# Patient Record
Sex: Female | Born: 1950
Health system: Southern US, Community
[De-identification: ages and names within clinical notes are randomized; demographics above are authoritative.]

## PROBLEM LIST (undated history)

## (undated) DIAGNOSIS — T7840XA Allergy, unspecified, initial encounter: Secondary | ICD-10-CM

## (undated) DIAGNOSIS — K621 Rectal polyp: Secondary | ICD-10-CM

## (undated) DIAGNOSIS — E7800 Pure hypercholesterolemia, unspecified: Secondary | ICD-10-CM

## (undated) DIAGNOSIS — K529 Noninfective gastroenteritis and colitis, unspecified: Secondary | ICD-10-CM

## (undated) DIAGNOSIS — I214 Non-ST elevation (NSTEMI) myocardial infarction: Secondary | ICD-10-CM

## (undated) DIAGNOSIS — I10 Essential (primary) hypertension: Secondary | ICD-10-CM

## (undated) DIAGNOSIS — K635 Polyp of colon: Secondary | ICD-10-CM

## (undated) DIAGNOSIS — E785 Hyperlipidemia, unspecified: Secondary | ICD-10-CM

## (undated) DIAGNOSIS — M1990 Unspecified osteoarthritis, unspecified site: Secondary | ICD-10-CM

## (undated) DIAGNOSIS — I739 Peripheral vascular disease, unspecified: Secondary | ICD-10-CM

## (undated) DIAGNOSIS — D649 Anemia, unspecified: Secondary | ICD-10-CM

## (undated) DIAGNOSIS — K648 Other hemorrhoids: Secondary | ICD-10-CM

## (undated) DIAGNOSIS — I2510 Atherosclerotic heart disease of native coronary artery without angina pectoris: Secondary | ICD-10-CM

## (undated) HISTORY — DX: Hyperlipidemia, unspecified: E78.5

## (undated) HISTORY — DX: Anemia, unspecified: D64.9

## (undated) HISTORY — DX: Rectal polyp: K62.1

## (undated) HISTORY — DX: Atherosclerotic heart disease of native coronary artery without angina pectoris: I25.10

## (undated) HISTORY — DX: Allergy, unspecified, initial encounter: T78.40XA

## (undated) HISTORY — DX: Pure hypercholesterolemia, unspecified: E78.00

## (undated) HISTORY — DX: Polyp of colon: K63.5

## (undated) HISTORY — DX: Unspecified osteoarthritis, unspecified site: M19.90

## (undated) HISTORY — DX: Essential (primary) hypertension: I10

## (undated) HISTORY — DX: Noninfective gastroenteritis and colitis, unspecified: K52.9

## (undated) HISTORY — DX: Other hemorrhoids: K64.8

## (undated) HISTORY — PX: ROTATOR CUFF REPAIR: SHX139

---

## 1991-08-03 HISTORY — PX: ABDOMINAL HYSTERECTOMY: SHX81

## 1998-02-17 ENCOUNTER — Emergency Department (HOSPITAL_COMMUNITY): Admission: EM | Admit: 1998-02-17 | Discharge: 1998-02-17 | Payer: Self-pay | Admitting: Emergency Medicine

## 2000-01-06 ENCOUNTER — Encounter: Payer: Self-pay | Admitting: Emergency Medicine

## 2000-01-06 ENCOUNTER — Emergency Department (HOSPITAL_COMMUNITY): Admission: EM | Admit: 2000-01-06 | Discharge: 2000-01-07 | Payer: Self-pay | Admitting: Emergency Medicine

## 2001-12-01 ENCOUNTER — Emergency Department (HOSPITAL_COMMUNITY): Admission: EM | Admit: 2001-12-01 | Discharge: 2001-12-01 | Payer: Self-pay | Admitting: Emergency Medicine

## 2001-12-01 ENCOUNTER — Encounter: Payer: Self-pay | Admitting: Emergency Medicine

## 2002-12-07 ENCOUNTER — Encounter (INDEPENDENT_AMBULATORY_CARE_PROVIDER_SITE_OTHER): Payer: Self-pay | Admitting: Specialist

## 2002-12-07 ENCOUNTER — Ambulatory Visit (HOSPITAL_COMMUNITY): Admission: RE | Admit: 2002-12-07 | Discharge: 2002-12-07 | Payer: Self-pay | Admitting: *Deleted

## 2008-02-16 ENCOUNTER — Encounter: Admission: RE | Admit: 2008-02-16 | Discharge: 2008-02-16 | Payer: Self-pay | Admitting: Internal Medicine

## 2009-01-07 ENCOUNTER — Emergency Department (HOSPITAL_COMMUNITY): Admission: EM | Admit: 2009-01-07 | Discharge: 2009-01-07 | Payer: Self-pay | Admitting: Family Medicine

## 2010-12-08 ENCOUNTER — Inpatient Hospital Stay (INDEPENDENT_AMBULATORY_CARE_PROVIDER_SITE_OTHER)
Admission: RE | Admit: 2010-12-08 | Discharge: 2010-12-08 | Disposition: A | Discharge status: Home / Self Care | Payer: BC Managed Care – PPO | Source: Ambulatory Visit | Attending: Family Medicine | Admitting: Family Medicine

## 2010-12-08 DIAGNOSIS — M25519 Pain in unspecified shoulder: Secondary | ICD-10-CM

## 2010-12-08 DIAGNOSIS — M67919 Unspecified disorder of synovium and tendon, unspecified shoulder: Secondary | ICD-10-CM

## 2010-12-18 NOTE — Op Note (Signed)
   NAMERUEY, STORER                          ACCOUNT NO.:  [id number]   MEDICAL RECORD NO.:  [id number]                   PATIENT TYPE:  AMB   LOCATION:  ENDO                                 FACILITY:  [facility]   PHYSICIAN:  Georgiana Spinner, M.D.                 DATE OF BIRTH:  [birth year]   DATE OF PROCEDURE:  12/07/2002  DATE OF DISCHARGE:                                 OPERATIVE REPORT   PROCEDURE:  Colonoscopy with polypectomy.   INDICATIONS FOR PROCEDURE:  Colon polyp, rectal bleeding.   ANESTHESIA:  Demerol 80, Versed 8 mg.   DESCRIPTION OF PROCEDURE:  With the patient mildly sedated in the left  lateral decubitus position, the Olympus videoscopic colonoscope was inserted  in the rectum and passed under direct vision to the cecum identified by the  ileocecal valve and appendiceal orifice both of which were photographed.  From this point, the colonoscope was slowly withdrawn taking circumferential  views of the entire colonic mucosa stopping only in the rectum which  appeared normal at first view and direct and showed hemorrhoids that were  fairly large on retroflexed view. On retroflexion view, we also saw a polyp  that was very distal in the rectum which we photographed and then in  straight view, we were able to remove it using snare cautery technique on a  setting of 20/20 blended current. There was good hemostasis when tissue was  retrieved for pathology.  The patient's vital signs and pulse oximeter  remained stable. The patient tolerated the procedure well without apparent  complications.   FINDINGS:  Large internal hemorrhoids, polyp in the rectum removed. Await  pathology report. The patient will call me for results and followup with me  as an outpatient.                                               Georgiana Spinner, M.D.    GMO/MEDQ  D:  12/07/2002  T:  12/07/2002  Job:  [id number]

## 2013-01-09 ENCOUNTER — Encounter (HOSPITAL_COMMUNITY): Payer: Self-pay | Admitting: *Deleted

## 2013-01-09 ENCOUNTER — Emergency Department (HOSPITAL_COMMUNITY)
Admission: EM | Admit: 2013-01-09 | Discharge: 2013-01-09 | Disposition: A | Discharge status: Home / Self Care | Payer: Managed Care, Other (non HMO) | Source: Home / Self Care | Attending: Emergency Medicine | Admitting: Emergency Medicine

## 2013-01-09 DIAGNOSIS — R109 Unspecified abdominal pain: Secondary | ICD-10-CM

## 2013-01-09 MED ORDER — OMEPRAZOLE 40 MG PO CPDR: 40.0000 mg | DELAYED_RELEASE_CAPSULE | Freq: Every day | ORAL | Status: DC

## 2013-01-09 MED ORDER — METRONIDAZOLE 500 MG PO TABS: 500.0000 mg | ORAL_TABLET | Freq: Three times a day (TID) | ORAL | Status: AC

## 2013-01-09 NOTE — ED Notes (Signed)
C/o abdominal cramping onset 1700.  Went to [facility] had a loose BM and vomited a little bit.  States she started sweating, sweat was running off of her face, and lasted for 30 minutes, through the whole process.  Feels better now except being cold.

## 2013-01-09 NOTE — ED Provider Notes (Signed)
History     CSN: [id number]  Arrival date & time 01/09/13  1800   First MD Initiated Contact with Patient 01/09/13 1856      Chief Complaint  Patient presents with  . Abdominal Cramping    (Consider location/radiation/quality/duration/timing/severity/associated sxs/prior treatment) HPI Comments: Patient presents to urgent care this evening complaining of sudden onset of abdominal cramping associated with increased bowel movements. She describes the stools as loose and had a episode of vomiting. Denies any fevers but felt like she was having chills. Denies any urinary symptoms. Describes that she feels much better after that cramping have disappeared.  Patient describes that she to recently a course of amoxicillin for a abscess she finished the course last week. Prior to today she has not had any fevers or abdominal pain. Patient denies any recent international trips.  Patient is a 62 y.o. female presenting with cramps. The history is provided by the patient.  Abdominal Cramping This is a new problem. The current episode started 3 to 5 hours ago. The problem has not changed since onset.Associated symptoms include abdominal pain. Pertinent negatives include no headaches and no shortness of breath. Nothing aggravates the symptoms. The treatment provided no relief.    History reviewed. No pertinent past medical history.  Past Surgical History  Procedure Laterality Date  . Abdominal hysterectomy  1993    Family History  Problem Relation Age of Onset  . Kidney failure Mother   . Heart attack Father     History  Substance Use Topics  . Smoking status: Current Every Day Smoker -- 0.50 packs/day    Types: Cigarettes  . Smokeless tobacco: Not on file  . Alcohol Use: No    OB History   Grav Para Term Preterm Abortions TAB SAB Ect Mult Living                  Review of Systems  Constitutional: Negative for fever, chills, activity change, appetite change and unexpected weight  change.  HENT: Negative for neck pain.   Respiratory: Negative for chest tightness and shortness of breath.   Gastrointestinal: Positive for nausea and abdominal pain. Negative for diarrhea, constipation, blood in stool and abdominal distention.  Genitourinary: Negative for pelvic pain.  Musculoskeletal: Negative for back pain.  Skin: Negative for color change and rash.  Neurological: Negative for dizziness and headaches.    Allergies  Keflex  Home Medications   Current Outpatient Rx  Name  Route  Sig  Dispense  Refill  . metroNIDAZOLE (FLAGYL) 500 MG tablet   Oral   Take 1 tablet (500 mg total) by mouth 3 (three) times daily.   14 tablet   0   . omeprazole (PRILOSEC) 40 MG capsule   Oral   Take 1 capsule (40 mg total) by mouth daily.   30 capsule   0     BP 161/78  Pulse 69  Temp(Src) 98.1 F (36.7 C) (Oral)  Resp 16  SpO2 100%  Physical Exam  Nursing note and vitals reviewed. Constitutional: Vital signs are normal. She appears well-developed and well-nourished.  Non-toxic appearance. She does not have a sickly appearance. No distress.  HENT:  Head: Normocephalic.  Eyes: Conjunctivae are normal.  Neck: Neck supple. No JVD present.  Pulmonary/Chest: Effort normal and breath sounds normal.  Abdominal: Soft. Normal appearance and bowel sounds are normal. She exhibits no shifting dullness, no distension, no pulsatile liver, no fluid wave, no abdominal bruit, no ascites, no pulsatile midline mass and  no mass. There is no hepatosplenomegaly, splenomegaly or hepatomegaly. There is tenderness in the epigastric area and periumbilical area. There is no rigidity, no rebound, no guarding, no CVA tenderness, no tenderness at McBurney's point and negative [name]'s sign. No hernia. Hernia confirmed negative in the ventral area.    Lymphadenopathy:    She has no cervical adenopathy.  Neurological: She is alert.  Skin: No rash noted. No erythema.    ED Course  Procedures  (including critical care time)  Labs Reviewed  STOOL CULTURE  CLOSTRIDIUM DIFFICILE BY PCR   No results found.   1. Abdominal cramps       MDM   Problem #1 abdominal pain *( cramping), with loose stools  Patient with upper abdominal pain and loose stools- within the last 24 hours. 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 Moderate level of suspicion for C. Difficile. ( Stool sample was collected tonight). Patient has been started on both Flagyl and a PPI.  Have discussed with patient what symptoms should warrant further evaluation in the emergency department she agrees and understands and will follow-up as necessary. She has been informed that she will be contacted by her nurse to send your if she has a positive result. She was also instructed if no improvement in next 24 hours it should go to the emergency department for further evaluation        Jimmie Molly, MD 01/09/13 2031

## 2013-01-10 LAB — CLOSTRIDIUM DIFFICILE BY PCR: Toxigenic C. Difficile by PCR: NEGATIVE

## 2013-01-13 LAB — STOOL CULTURE: Special Requests: NORMAL

## 2013-01-22 ENCOUNTER — Emergency Department (HOSPITAL_COMMUNITY): Payer: Managed Care, Other (non HMO)

## 2013-01-22 ENCOUNTER — Encounter (HOSPITAL_COMMUNITY): Payer: Self-pay | Admitting: Emergency Medicine

## 2013-01-22 ENCOUNTER — Emergency Department (HOSPITAL_COMMUNITY)
Admission: EM | Admit: 2013-01-22 | Discharge: 2013-01-22 | Disposition: A | Discharge status: Home / Self Care | Payer: Managed Care, Other (non HMO) | Attending: Emergency Medicine | Admitting: Emergency Medicine

## 2013-01-22 DIAGNOSIS — R197 Diarrhea, unspecified: Secondary | ICD-10-CM | POA: Insufficient documentation

## 2013-01-22 DIAGNOSIS — R1110 Vomiting, unspecified: Secondary | ICD-10-CM | POA: Insufficient documentation

## 2013-01-22 DIAGNOSIS — K529 Noninfective gastroenteritis and colitis, unspecified: Secondary | ICD-10-CM

## 2013-01-22 DIAGNOSIS — K5289 Other specified noninfective gastroenteritis and colitis: Secondary | ICD-10-CM | POA: Insufficient documentation

## 2013-01-22 DIAGNOSIS — Z90710 Acquired absence of both cervix and uterus: Secondary | ICD-10-CM | POA: Insufficient documentation

## 2013-01-22 DIAGNOSIS — F17200 Nicotine dependence, unspecified, uncomplicated: Secondary | ICD-10-CM | POA: Insufficient documentation

## 2013-01-22 DIAGNOSIS — Z79899 Other long term (current) drug therapy: Secondary | ICD-10-CM | POA: Insufficient documentation

## 2013-01-22 LAB — COMPREHENSIVE METABOLIC PANEL
AST: 11 U/L (ref 0–37)
BUN: 8 mg/dL (ref 6–23)
CO2: 26 mEq/L (ref 19–32)
Calcium: 9.7 mg/dL (ref 8.4–10.5)
Creatinine, Ser: 0.79 mg/dL (ref 0.50–1.10)
GFR calc Af Amer: >90 mL/min (ref >90)
GFR calc non Af Amer: 89 mL/min — ABNORMAL LOW (ref >90)

## 2013-01-22 LAB — CBC WITH DIFFERENTIAL/PLATELET
Basophils Absolute: 0 10*3/uL (ref 0.0–0.1)
Lymphocytes Relative: 26 % (ref 12–46)
Lymphs Abs: 2.7 10*3/uL (ref 0.7–4.0)
Neutro Abs: 6.9 10*3/uL (ref 1.7–7.7)
Neutrophils Relative %: 66 % (ref 43–77)
Platelets: 371 10*3/uL (ref 150–400)
RBC: 5.01 MIL/uL (ref 3.87–5.11)
WBC: 10.5 10*3/uL (ref 4.0–10.5)

## 2013-01-22 LAB — URINALYSIS, ROUTINE W REFLEX MICROSCOPIC
Glucose, UA: NEGATIVE mg/dL
Leukocytes, UA: NEGATIVE
Nitrite: NEGATIVE
pH: 7 (ref 5.0–8.0)

## 2013-01-22 LAB — LIPASE, BLOOD: Lipase: 19 U/L (ref 11–59)

## 2013-01-22 MED ORDER — ONDANSETRON HCL 4 MG/2ML IJ SOLN
4.0000 mg | Freq: Once | INTRAMUSCULAR | Status: AC
  Administered 2013-01-22: 4 mg via INTRAVENOUS
  Filled 2013-01-22: qty 2

## 2013-01-22 MED ORDER — OXYCODONE-ACETAMINOPHEN 5-325 MG PO TABS: 2.0000 | ORAL_TABLET | ORAL | Status: DC | PRN

## 2013-01-22 MED ORDER — SODIUM CHLORIDE 0.9 % IV BOLUS (SEPSIS)
1000.0000 mL | Freq: Once | INTRAVENOUS | Status: AC
  Administered 2013-01-22: 1000 mL via INTRAVENOUS

## 2013-01-22 MED ORDER — ONDANSETRON HCL 4 MG/2ML IJ SOLN
4.0000 mg | Freq: Once | INTRAMUSCULAR | Status: DC
  Filled 2013-01-22: qty 2

## 2013-01-22 MED ORDER — IOHEXOL 300 MG/ML  SOLN
50.0000 mL | Freq: Once | INTRAMUSCULAR | Status: AC | PRN
  Administered 2013-01-22: 50 mL via ORAL

## 2013-01-22 MED ORDER — PROMETHAZINE HCL 25 MG PO TABS: 25.0000 mg | ORAL_TABLET | Freq: Four times a day (QID) | ORAL | Status: DC | PRN

## 2013-01-22 MED ORDER — IOHEXOL 300 MG/ML  SOLN
100.0000 mL | Freq: Once | INTRAMUSCULAR | Status: AC | PRN
  Administered 2013-01-22: 100 mL via INTRAVENOUS

## 2013-01-22 MED ORDER — FENTANYL CITRATE 0.05 MG/ML IJ SOLN
100.0000 ug | Freq: Once | INTRAMUSCULAR | Status: AC
  Administered 2013-01-22: 100 ug via INTRAVENOUS
  Filled 2013-01-22: qty 2

## 2013-01-22 NOTE — ED Provider Notes (Addendum)
History    CSN: [id number] Arrival date & time 01/22/13  1602  First MD Initiated Contact with Patient 01/22/13 1607     Chief Complaint  Patient presents with  . Abdominal Pain    HPI Per EMS: two week history of epigastric pain. Pt vomiting in route to ED. Pt states she has had diarrhea since yesterday.patient recently seen in urgent care for similar symptoms.  Was given Flagyl which seemed to temporarily improve her symptoms.  Symptoms have since returned.  Denies fever or chills.  Denies blood in her stool.  Past surgical history positive for hysterectomy otherwise unremarkable.   History reviewed. No pertinent past medical history. Past Surgical History  Procedure Laterality Date  . Abdominal hysterectomy  1993   Family History  Problem Relation Age of Onset  . Kidney failure Mother   . Heart attack Father    History  Substance Use Topics  . Smoking status: Current Every Day Smoker -- 0.50 packs/day    Types: Cigarettes  . Smokeless tobacco: Not on file  . Alcohol Use: No   OB History   Grav Para Term Preterm Abortions TAB SAB Ect Mult Living                 Review of Systems All other systems reviewed an Allergies  Keflex and Tomato  Home Medications   Current Outpatient Rx  Name  Route  Sig  Dispense  Refill  . hydroxypropyl methylcellulose (ISOPTO TEARS) 2.5 % ophthalmic solution   Both Eyes   Place 1 drop into both eyes 3 (three) times daily as needed (dry eyes).         Marland Kitchen omeprazole (PRILOSEC) 40 MG capsule   Oral   Take 1 capsule (40 mg total) by mouth daily.   30 capsule   0   . oxyCODONE-acetaminophen (PERCOCET/ROXICET) 5-325 MG per tablet   Oral   Take 2 tablets by mouth every 4 (four) hours as needed for pain.   20 tablet   0   . promethazine (PHENERGAN) 25 MG tablet   Oral   Take 1 tablet (25 mg total) by mouth every 6 (six) hours as needed for nausea.   30 tablet   0    BP 157/64  Pulse 76  Temp(Src) 98.3 F (36.8 C) (Oral)   Resp 18  Ht 5\' 3"  (1.6 m)  Wt 148 lb (67.132 kg)  BMI 26.22 kg/m2  SpO2 98% Physical Exam  Nursing note and vitals reviewed. Constitutional: She is oriented to person, place, and time. She appears well-developed and well-nourished. No distress.  HENT:  Head: Normocephalic and atraumatic.  Eyes: Pupils are equal, round, and reactive to light.  Neck: Normal range of motion.  Cardiovascular: Normal rate and intact distal pulses.   Pulmonary/Chest: No respiratory distress.  Abdominal: Normal appearance and bowel sounds are normal. She exhibits no distension. There is tenderness in the left upper quadrant. There is no rebound and no guarding.    Musculoskeletal: Normal range of motion.  Neurological: She is alert and oriented to person, place, and time. No cranial nerve deficit.  Skin: Skin is warm and dry. No rash noted.  Psychiatric: She has a normal mood and affect. Her behavior is normal.    ED Course  Procedures (including critical care time)  Medications  ondansetron (ZOFRAN) injection 4 mg (4 mg Intravenous Not Given 01/22/13 1914)  sodium chloride 0.9 % bolus 1,000 mL (1,000 mLs Intravenous New Bag/Given 01/22/13 1719)  fentaNYL (SUBLIMAZE) injection 100 mcg (100 mcg Intravenous Given 01/22/13 1719)  ondansetron (ZOFRAN) injection 4 mg (4 mg Intravenous Given 01/22/13 1718)  fentaNYL (SUBLIMAZE) injection 100 mcg (100 mcg Intravenous Given 01/22/13 1908)    Labs Reviewed  COMPREHENSIVE METABOLIC PANEL - Abnormal; Notable for the following:    Potassium 3.3 (*)    GFR calc non Af Amer 89 (*)    All other components within normal limits  LIPASE, BLOOD  CBC WITH DIFFERENTIAL  URINALYSIS, ROUTINE W REFLEX MICROSCOPIC   Ct Abdomen Pelvis W Contrast  01/22/2013   *RADIOLOGY REPORT*  Clinical Data: Epigastric pain with vomiting and diarrhea for 2 weeks.  History of hysterectomy.  CT ABDOMEN AND PELVIS WITH CONTRAST  Technique:  Multidetector CT imaging of the abdomen and pelvis  was performed following the standard protocol during bolus administration of intravenous contrast.  Contrast: OMNIPAQUE IOHEXOL 300 MG/ML  SOLN, 50mL OMNIPAQUE IOHEXOL 300 MG/ML  SOLN  Comparison: None.  Findings: The lung bases are clear.  There is no pleural or pericardial effusion.  The liver, gallbladder, biliary system and pancreas appear normal. The spleen, adrenal glands and kidneys appear normal.  The stomach, small bowel and proximal colon appear normal.  There is irregular colonic wall thickening extending from the distal transverse colon through the descending colon.  The sigmoid colon and rectum appear normal.  There is no evidence of bowel obstruction or extraluminal fluid collection.  There is irregular atherosclerosis of the abdominal aorta which is mildly dilated below the renal arteries to 2.8 cm.  There is associated mural thrombus posteriorly on the left. There is no evidence of large vessel occlusion, although the inferior mesenteric artery is not well visualized.  No enlarged abdominal pelvic lymph nodes are identified.  There is no pelvic mass status post hysterectomy.  There is probable normal ovarian tissue on the right.  The bladder appears normal.  There are no acute or worrisome osseous findings.  A mild scoliosis is noted.  IMPRESSION:  1.  Mid to distal colonic wall thickening consistent with colitis. This distribution can be seen with ischemia and infectious colitis, including pseudomembranous colitis. 2.  No evidence of bowel perforation, abscess or obstruction. 3.  Atherosclerosis with mild dilatation of the infrarenal abdominal aorta.   Original Report Authenticated By: Carey Bullocks, M.D.   1. Colitis     MDM  Discussed the case in CT results with Dr.  Marina Goodell from GI.  He recommended followup in his office this week.  He stated to call the office tomorrow and either a physician or PA could see her this week.  Nelia Shi, MD 01/22/13 2227  Nelia Shi,  MD 02/28/13 [phone]

## 2013-01-22 NOTE — ED Notes (Signed)
Pt stated that she is not able to urinate.   Will try again in 30 minutes. 

## 2013-01-22 NOTE — ED Notes (Signed)
Per EMS: two week history of epigastric pain. Pt vomiting in route to ED. Pt states she has had diarrhea since yesterday.

## 2013-01-22 NOTE — ED Notes (Signed)
Patient is aware we need a urine specimen. States "I can't go, I  don't have anything in me"

## 2013-01-22 NOTE — ED Notes (Signed)
ZOX:[id number]<AV> Expected date:<BR> Expected time:<BR> Means of arrival:<BR> Comments:<BR> 61yoF, n/v

## 2013-01-23 ENCOUNTER — Encounter: Payer: Self-pay | Admitting: Internal Medicine

## 2013-02-19 ENCOUNTER — Encounter: Payer: Self-pay | Admitting: Internal Medicine

## 2013-02-19 ENCOUNTER — Ambulatory Visit (INDEPENDENT_AMBULATORY_CARE_PROVIDER_SITE_OTHER): Payer: Managed Care, Other (non HMO) | Admitting: Internal Medicine

## 2013-02-19 VITALS — BP 160/88 | HR 80 | Ht 63.0 in | Wt 143.0 lb

## 2013-02-19 DIAGNOSIS — Z1211 Encounter for screening for malignant neoplasm of colon: Secondary | ICD-10-CM

## 2013-02-19 DIAGNOSIS — K5289 Other specified noninfective gastroenteritis and colitis: Secondary | ICD-10-CM

## 2013-02-19 DIAGNOSIS — R933 Abnormal findings on diagnostic imaging of other parts of digestive tract: Secondary | ICD-10-CM

## 2013-02-19 DIAGNOSIS — R1084 Generalized abdominal pain: Secondary | ICD-10-CM

## 2013-02-19 MED ORDER — MOVIPREP 100 G PO SOLR: 1.0000 | Freq: Once | ORAL | Status: DC

## 2013-02-19 NOTE — Patient Instructions (Addendum)

## 2013-02-19 NOTE — Progress Notes (Signed)
HISTORY OF PRESENT ILLNESS:  Cassandra Santana is a 62 y.o. female smoker with no significant past medical history who presents today, after referral from the emergency room physician Dr. Radford Pax, regarding recent abdominal complaints and an abnormal CT scan identified in the emergency room. The patient's history dates back to 01/09/2013 when she was evaluated at urgent care for sudden onset of abdominal cramping and increased bowel movements. She was treated empirically with metronidazole and omeprazole. She seemed to improve until June 22 when she developed acute nausea, vomiting, and diarrhea. As well, significant generalized abdominal cramping discomfort. There was rectal bleeding. She was evaluated in the emergency room. Blood work at that time revealed a potassium of 3.3. Comprehensive metabolic panel and CBC was otherwise normal with a white blood cell count of 10.5 and hemoglobin 14.3. She did undergo a contrast-enhanced CT scan of the abdomen and pelvis on June 23. This revealed mid to distal colonic wall thickening of the distal transverse colon and descending colon. She was treated with IV fluids and given medication for pain and nausea. Within a few days, her symptoms resolved. She now states that she is back to normal with regular bowel movements. No further issues with bleeding. Occasional transient abdominal cramping discomfort. 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 She did undergo complete colonoscopy in May of 2004 with Dr. Sabino Gasser. Examination revealed large internal hemorrhoids. A diminutive polyp was removed (rectal) and found to be hyperplastic. She has had no followup since. No family history of colon cancer  REVIEW OF SYSTEMS:  All non-GI ROS negative except for headaches, night sweats, visual change  Past Medical History  Diagnosis Date  . Colitis   . Rectal polyp     hyperplastic  . Internal hemorrhoids     Past Surgical History  Procedure Laterality Date  .  Abdominal hysterectomy  1993    Social History Cassandra Santana  reports that she has been smoking Cigarettes.  She has been smoking about 0.50 packs per day. She has never used smokeless tobacco. She reports that she does not drink alcohol or use illicit drugs.  family history includes Heart attack in her father and Kidney failure in her mother.  Allergies  Allergen Reactions  . Keflex (Cephalexin) Hives  . Tomato Hives and Itching       PHYSICAL EXAMINATION: Vital signs: BP 160/88  Pulse 80  Ht 5\' 3"  (1.6 m)  Wt 143 lb (64.864 kg)  BMI 25.34 kg/m2  Constitutional: generally well-appearing, no acute distress Psychiatric: alert and oriented x3, cooperative Eyes: extraocular movements intact, anicteric, conjunctiva pink Mouth: oral pharynx moist, no lesions Neck: supple no lymphadenopathy Cardiovascular: heart regular rate and rhythm, no murmur Lungs: clear to auscultation bilaterally Abdomen: soft, nontender, nondistended, no obvious ascites, no peritoneal signs, normal bowel sounds, no organomegaly Rectal: Deferred until colonoscopy Extremities: no lower extremity edema bilaterally Skin: no lesions on visible extremities Neuro: No focal deficits.    ASSESSMENT:  #1. Recent problems with acute abdominal pain, nausea, vomiting, and loose stools with blood. CT scan revealing left-sided segmental colitis. Clinical picture is most consistent with acute ischemic colitis. Now resolved. #2. Index colonoscopy May 2004 with hyperplastic polyp. Due for routine repeat screening at this time   PLAN:  #1. Discussion today on ischemic colitis. We discussed the pathophysiology, clinical course, treatment, and outcomes #2. Schedule screening colonoscopy. As well, can evaluate the left colon for any residual abnormalities.The nature of the procedure, as well as the risks,  benefits, and alternatives were carefully and thoroughly reviewed with the patient. Ample time for discussion and  questions allowed. The patient understood, was satisfied, and agreed to proceed. #3. Movi prep prescribed. The patient instructed on its use

## 2013-03-01 ENCOUNTER — Encounter: Payer: Self-pay | Admitting: Internal Medicine

## 2013-03-01 ENCOUNTER — Ambulatory Visit (AMBULATORY_SURGERY_CENTER): Payer: Managed Care, Other (non HMO) | Admitting: Internal Medicine

## 2013-03-01 VITALS — BP 159/87 | HR 74 | Temp 97.7°F | Resp 18 | Ht 63.0 in | Wt 143.0 lb

## 2013-03-01 DIAGNOSIS — D126 Benign neoplasm of colon, unspecified: Secondary | ICD-10-CM

## 2013-03-01 DIAGNOSIS — Z1211 Encounter for screening for malignant neoplasm of colon: Secondary | ICD-10-CM

## 2013-03-01 DIAGNOSIS — K5289 Other specified noninfective gastroenteritis and colitis: Secondary | ICD-10-CM

## 2013-03-01 MED ORDER — FLEET ENEMA 7-19 GM/118ML RE ENEM
1.0000 | ENEMA | Freq: Once | RECTAL | Status: AC
  Administered 2013-03-01: 1 via RECTAL

## 2013-03-01 MED ORDER — SODIUM CHLORIDE 0.9 % IV SOLN: 500.0000 mL | INTRAVENOUS | Status: DC

## 2013-03-01 NOTE — Op Note (Signed)
[facility] [address] [place], [address]   COLONOSCOPY PROCEDURE REPORT  PATIENT: Lounell, Schumacher  MR#: [id number] BIRTHDATE: Dec 02, 1950 , 61  yrs. old GENDER: Female ENDOSCOPIST: Roxy Cedar, MD REFERRED BY:.  Self / Office PROCEDURE DATE:  03/01/2013 PROCEDURE:   Colonoscopy with snare polypectomy x  1 First Screening Colonoscopy - Avg.  risk and is 50 yrs.  old or older - No.  Prior Negative Screening - Now for repeat screening. 10 or more years since last screening  History of Adenoma - Now for follow-up colonoscopy & has been > or = to 3 yrs.  N/A  Polyps Removed Today? Yes. ASA CLASS:   Class II INDICATIONS:average risk screening.   Negative exam with Dr. Virginia Rochester 2004 MEDICATIONS: MAC sedation, administered by CRNA and propofol (Diprivan) 300mg  IV  DESCRIPTION OF PROCEDURE:   After the risks benefits and alternatives of the procedure were thoroughly explained, informed consent was obtained.  A digital rectal exam revealed no abnormalities of the rectum.   The LB [id number]  endoscope was introduced through the anus and advanced to the cecum, which was identified by both the appendix and ileocecal valve. No adverse events experienced.   The quality of the prep was excellent, using MoviPrep  The instrument was then slowly withdrawn as the colon was fully examined.    COLON FINDINGS: A diminutive polyp was found in the sigmoid colon. A polypectomy was performed with a cold snare.  The resection was complete and the polyp tissue was completely retrieved.   The colon mucosa was otherwise normal.  Retroflexed views revealed internal hemorrhoids. The time to cecum=2 minutes 42 seconds.  Withdrawal time=12 minutes 29 seconds.  The scope was withdrawn and the procedure completed. COMPLICATIONS: There were no complications.  ENDOSCOPIC IMPRESSION: 1.   Diminutive polyp was found in the sigmoid colon; polypectomy was performed with a cold snare 2.    The colon mucosa was otherwise normal  RECOMMENDATIONS: Repeat colonoscopy in 5 years if polyp adenomatous; otherwise 10 years   eSigned:  Roxy Cedar, MD 03/01/2013 10:20 AM   cc: Romero Liner, MD and The Patient   PATIENT NAME:  Sherice, Ijames MR#: [id number]

## 2013-03-01 NOTE — Progress Notes (Signed)
No complaints noted in the recovery room. Maw   

## 2013-03-01 NOTE — Progress Notes (Addendum)
Pt. States that she has dark brown liquid return from prep. Fleets enema given.  Results clear tan with a few fecal flakes.    BP rechecked  170/90 left arm.

## 2013-03-01 NOTE — Progress Notes (Signed)
Pt. States that she is very nervous about needles.  IV inserted by Everlean Alstrom. Pt. States that it stings.  Everlean Alstrom offered to restart IV due to pt. Discomfort.  IV site clear, IV infusing well Pt. States that she does not want the IV restarted.  IV left in per pt. Wishes.

## 2013-03-01 NOTE — Progress Notes (Signed)
Called to room to assist during endoscopic procedure.  Patient ID and intended procedure confirmed with present staff. Received instructions for my participation in the procedure from the performing physician.  

## 2013-03-01 NOTE — Patient Instructions (Addendum)
YOU HAD AN ENDOSCOPIC PROCEDURE TODAY AT THE [facility]: Refer to the procedure report that was given to you for any specific questions about what was found during the examination.  If the procedure report does not answer your questions, please call your gastroenterologist to clarify.  If you requested that your care partner not be given the details of your procedure findings, then the procedure report has been included in a sealed envelope for you to review at your convenience later.  YOU SHOULD EXPECT: Some feelings of bloating in the abdomen. Passage of more gas than usual.  Walking can help get rid of the air that was put into your GI tract during the procedure and reduce the bloating. If you had a lower endoscopy (such as a colonoscopy or flexible sigmoidoscopy) you may notice spotting of blood in your stool or on the toilet paper. If you underwent a bowel prep for your procedure, then you may not have a normal bowel movement for a few days.  DIET: Your first meal following the procedure should be a light meal and then it is ok to progress to your normal diet.  A half-sandwich or bowl of soup is an example of a good first meal.  Heavy or fried foods are harder to digest and may make you feel nauseous or bloated.  Likewise meals heavy in dairy and vegetables can cause extra gas to form and this can also increase the bloating.  Drink plenty of fluids but you should avoid alcoholic beverages for 24 hours.  ACTIVITY: Your care partner should take you home directly after the procedure.  You should plan to take it easy, moving slowly for the rest of the day.  You can resume normal activity the day after the procedure however you should NOT DRIVE or use heavy machinery for 24 hours (because of the sedation medicines used during the test).    SYMPTOMS TO REPORT IMMEDIATELY: A gastroenterologist can be reached at any hour.  During normal business hours, 8:30 AM to 5:00 PM Monday through Friday,  call [phone].  After hours and on weekends, please call the GI answering service at [phone] who will take a message and have the physician on call contact you.   Following lower endoscopy (colonoscopy or flexible sigmoidoscopy):  Excessive amounts of blood in the stool  Significant tenderness or worsening of abdominal pains  Swelling of the abdomen that is new, acute  Fever of 100F or higher    FOLLOW UP: If any biopsies were taken you will be contacted by phone or by letter within the next 1-3 weeks.  Call your gastroenterologist if you have not heard about the biopsies in 3 weeks.  Our staff will call the home number listed on your records the next business day following your procedure to check on you and address any questions or concerns that you may have at that time regarding the information given to you following your procedure. This is a courtesy call and so if there is no answer at the home number and we have not heard from you through the emergency physician on call, we will assume that you have returned to your regular daily activities without incident.  SIGNATURES/CONFIDENTIALITY: You and/or your care partner have signed paperwork which will be entered into your electronic medical record.  These signatures attest to the fact that that the information above on your After Visit Summary has been reviewed and is understood.  Full responsibility of the confidentiality   of this discharge information lies with you and/or your care-partner.    Handouts were given to your care partner on polyps, hemorrhoids and a high fiber diet. You may resume your current medications today. Please call if any questions or concerns.

## 2013-03-02 ENCOUNTER — Telehealth: Payer: Self-pay

## 2013-03-02 NOTE — Telephone Encounter (Signed)
Attempt follow up call left message to call [facility] if any problems post procedure

## 2013-03-06 ENCOUNTER — Encounter: Payer: Self-pay | Admitting: Internal Medicine

## 2016-08-24 ENCOUNTER — Ambulatory Visit: Payer: Self-pay

## 2017-01-03 ENCOUNTER — Encounter (HOSPITAL_COMMUNITY): Payer: Self-pay | Admitting: Emergency Medicine

## 2017-01-03 ENCOUNTER — Ambulatory Visit (HOSPITAL_COMMUNITY)
Admission: EM | Admit: 2017-01-03 | Discharge: 2017-01-03 | Disposition: A | Discharge status: Home / Self Care | Payer: BLUE CROSS/BLUE SHIELD | Attending: Emergency Medicine | Admitting: Emergency Medicine

## 2017-01-03 DIAGNOSIS — S161XXA Strain of muscle, fascia and tendon at neck level, initial encounter: Secondary | ICD-10-CM

## 2017-01-03 DIAGNOSIS — S46812A Strain of other muscles, fascia and tendons at shoulder and upper arm level, left arm, initial encounter: Secondary | ICD-10-CM | POA: Diagnosis not present

## 2017-01-03 DIAGNOSIS — H6982 Other specified disorders of Eustachian tube, left ear: Secondary | ICD-10-CM | POA: Diagnosis not present

## 2017-01-03 DIAGNOSIS — H9202 Otalgia, left ear: Secondary | ICD-10-CM | POA: Diagnosis not present

## 2017-01-03 MED ORDER — NAPROXEN 375 MG PO TABS: 375.0000 mg | ORAL_TABLET | Freq: Two times a day (BID) | ORAL | 0 refills | Status: DC

## 2017-01-03 NOTE — ED Provider Notes (Signed)
CSN: [id number]     Arrival date & time 01/03/17  1449 History   First MD Initiated Contact with Patient 01/03/17 1610     Chief Complaint  Patient presents with  . Otalgia   (Consider location/radiation/quality/duration/timing/severity/associated sxs/prior Treatment) 66 year old female presents with left earache starting at the beginning in the last week of May. Earache is associated with pain in the left side of the neck and trapezius. She states the evening before she developed the muscle pain she had slept under a fan. There is pain when rotating and tilting the head. She points to the trapezius and scalene muscles on the left. Also admits to having PND chronically.      Past Medical History:  Diagnosis Date  . Colitis   . Internal hemorrhoids   . Rectal polyp    hyperplastic   Past Surgical History:  Procedure Laterality Date  . ABDOMINAL HYSTERECTOMY  1993   Family History  Problem Relation Age of Onset  . Kidney failure Mother   . Heart attack Father    Social History  Substance Use Topics  . Smoking status: Current Every Day Smoker    Packs/day: 0.50    Types: Cigarettes  . Smokeless tobacco: Never Used  . Alcohol use No   OB History    No data available     Review of Systems  Constitutional: Negative.   HENT: Positive for postnasal drip. Negative for ear discharge and rhinorrhea.   Respiratory: Negative.   Gastrointestinal: Negative.   Musculoskeletal: Positive for neck pain.  Neurological: Negative.   All other systems reviewed and are negative.   Allergies  Keflex [cephalexin] and Tomato  Home Medications   Prior to Admission medications   Medication Sig Start Date End Date Taking? Authorizing Provider  naproxen (NAPROSYN) 375 MG tablet Take 1 tablet (375 mg total) by mouth 2 (two) times daily. 01/03/17   Janne Napoleon, NP   Meds Ordered and Administered this Visit  Medications - No data to display  BP (!) 202/84 (BP Location: Right Arm)   Pulse  75   Temp 98.2 F (36.8 C) (Oral)   Resp 18   SpO2 100%  No data found.   Physical Exam  Constitutional: She is oriented to person, place, and time. She appears well-developed and well-nourished. No distress.  HENT:  Mouth/Throat: No oropharyngeal exudate.  Bilateral TMs are retracted. No erythema no observed effusion.  Oropharynx with scant clear PND otherwise normal.  Neck: Normal range of motion.  Tenderness to the left trapezius muscle along the ridge as well as insertion points to the left posterior lateral neck. Tenderness to the anterior scalene muscle. Pain is produced with having the patient turn her head to the left and with flexion.  Cardiovascular: Normal rate and regular rhythm.   Pulmonary/Chest: Effort normal and breath sounds normal.  Musculoskeletal: Normal range of motion. She exhibits no edema or deformity.  Lymphadenopathy:    She has no cervical adenopathy.  Neurological: She is alert and oriented to person, place, and time.  Skin: Skin is warm and dry.  Psychiatric: She has a normal mood and affect.  Nursing note and vitals reviewed.   Urgent Care Course    ED ECG REPORT   Date: 01/03/2017  Rate: 65  Rhythm: normal sinus rhythm  QRS Axis: normal  Intervals: normal  ST/T Wave abnormalities: normal  Conduction Disutrbances:none  Narrative Interpretation:   Old EKG Reviewed: unchanged  I have personally reviewed the EKG tracing and  agree with the computerized printout as noted.  Procedures (including critical care time)  Labs Review Labs Reviewed - No data to display  Imaging Review No results found.   Visual Acuity Review  Right Eye Distance:   Left Eye Distance:   Bilateral Distance:    Right Eye Near:   Left Eye Near:    Bilateral Near:         MDM   1. Left ear pain   2. Eustachian tube dysfunction, left   3. Trapezius strain, left, initial encounter   4. Cervical muscle strain, initial encounter    Take Sudafed PE 10 mg  every 6 hours as needed for congestion to help open the eustachian tube. Continue the Zyrtec. Apply warm compresses to the ear  which may help with discomfort. Apply heat to the neck muscles off and on. Perform stretches as demonstrated. Meds ordered this encounter  Medications  . naproxen (NAPROSYN) 375 MG tablet    Sig: Take 1 tablet (375 mg total) by mouth 2 (two) times daily.    Dispense:  20 tablet    Refill:  0    Order Specific Question:   Supervising Provider    Answer:   Melynda Ripple [[id number]]        Janne Napoleon, NP 01/03/17 1640

## 2017-01-03 NOTE — Discharge Instructions (Signed)
Take Sudafed PE 10 mg every 6 hours as needed for congestion to help open the eustachian tube. Continue the Zyrtec. Apply warm compresses near the ear  which may help with discomfort. Apply heat to the neck muscles off and on. Perform stretches as demonstrated.

## 2017-01-03 NOTE — ED Triage Notes (Signed)
The patient presented to the [facility] with a complaint of left side ear pain x 2 weeks.

## 2018-02-21 ENCOUNTER — Encounter (HOSPITAL_COMMUNITY): Payer: Self-pay | Admitting: Emergency Medicine

## 2018-02-21 ENCOUNTER — Ambulatory Visit (HOSPITAL_COMMUNITY)
Admission: EM | Admit: 2018-02-21 | Discharge: 2018-02-21 | Disposition: A | Discharge status: Home / Self Care | Payer: Medicare HMO | Attending: Family Medicine | Admitting: Family Medicine

## 2018-02-21 DIAGNOSIS — L739 Follicular disorder, unspecified: Secondary | ICD-10-CM

## 2018-02-21 MED ORDER — DOXYCYCLINE HYCLATE 100 MG PO CAPS
100.0000 mg | ORAL_CAPSULE | Freq: Two times a day (BID) | ORAL | 1 refills | Status: DC
Start: 2018-02-21 — End: 2018-04-07

## 2018-02-21 NOTE — ED Triage Notes (Signed)
Pt here for possible abscess to back

## 2018-02-21 NOTE — Discharge Instructions (Signed)
Wash with antibacterial soap Take antibiotics for 7 days, this should clear up the infection your skin  Repeat if needed Follow-up with your PCP

## 2018-02-21 NOTE — ED Provider Notes (Signed)
[place]    CSN: [id number] Arrival date & time: 02/21/18  1253     History   Chief Complaint Chief Complaint  Patient presents with  . Abscess    HPI Cassandra Santana is a 67 y.o. female.   HPI  Patient is here for skin infections.  She states that she had one on her back that she scratched off with her back scratcher.  Ever since then she is popping up with more of them.  She shows me several on her chest and back.  She thinks she might have a "bug".  No sensitive skin or dermatitis condition in the past.  She is on no medication.  No new soap lotion powder or product.  She states she always uses Dial soap.  No recent fever or illness.  Past Medical History:  Diagnosis Date  . Colitis   . Internal hemorrhoids   . Rectal polyp    hyperplastic    There are no active problems to display for this patient.   Past Surgical History:  Procedure Laterality Date  . ABDOMINAL HYSTERECTOMY  1993    OB History   None      Home Medications    Prior to Admission medications   Medication Sig Start Date End Date Taking? Authorizing Provider  naproxen (NAPROSYN) 375 MG tablet Take 1 tablet (375 mg total) by mouth 2 (two) times daily. 01/03/17   Janne Napoleon, NP    Family History Family History  Problem Relation Age of Onset  . Kidney failure Mother   . Heart attack Father     Social History Social History   Tobacco Use  . Smoking status: Current Every Day Smoker    Packs/day: 0.50    Types: Cigarettes  . Smokeless tobacco: Never Used  Substance Use Topics  . Alcohol use: No  . Drug use: No     Allergies   Keflex [cephalexin] and Tomato   Review of Systems Review of Systems  Constitutional: Negative for chills and fever.  HENT: Negative for ear pain and sore throat.   Eyes: Negative for pain and visual disturbance.  Respiratory: Negative for cough and shortness of breath.   Cardiovascular: Negative for chest pain and palpitations.    Gastrointestinal: Negative for abdominal pain and vomiting.  Genitourinary: Negative for dysuria and hematuria.  Musculoskeletal: Negative for arthralgias and back pain.  Skin: Positive for rash. Negative for color change.  Neurological: Negative for seizures and syncope.  All other systems reviewed and are negative.    Physical Exam Triage Vital Signs ED Triage Vitals [02/21/18 1315]  Enc Vitals Group     BP (!) 166/101     Pulse Rate 94     Resp 18     Temp 98.1 F (36.7 C)     Temp Source Oral     SpO2 100 %     Weight      Height      Head Circumference      Peak Flow      Pain Score      Pain Loc      Pain Edu?      Excl. in [place]?    No data found.  Updated Vital Signs BP (!) 166/101 (BP Location: Left Arm)   Pulse 94   Temp 98.1 F (36.7 C) (Oral)   Resp 18   SpO2 100%        Physical Exam  Constitutional: She appears well-developed and  well-nourished. No distress.  HENT:  Head: Normocephalic and atraumatic.  Mouth/Throat: Oropharynx is clear and moist.  Eyes: Pupils are equal, round, and reactive to light. Conjunctivae are normal.  Neck: Normal range of motion.  Cardiovascular: Normal rate.  Pulmonary/Chest: Effort normal. No respiratory distress.  Abdominal: Soft. She exhibits no distension.  Musculoskeletal: Normal range of motion. She exhibits no edema.  Neurological: She is alert.  Skin: Skin is warm and dry.  Few scattered hyperpigmented macules with small excoriated center.  No vesicles.  No infection.  No tenderness.  No drainage.  Present on chest and back.  One on upper arm.  These appear to be small pustules that have been excoriated or scratched.  Possible insect bites.  Psychiatric: She has a normal mood and affect. Her behavior is normal.     UC Treatments / Results  Labs (all labs ordered are listed, but only abnormal results are displayed) Labs Reviewed - No data to display  EKG None  Radiology No results  found.  Procedures Procedures (including critical care time)  Medications Ordered in UC Medications - No data to display  Initial Impression / Assessment and Plan / UC Course  I have reviewed the triage vital signs and the nursing notes.  Pertinent labs & imaging results that were available during my care of the patient were reviewed by me and considered in my medical decision making (see chart for details).      Final Clinical Impressions(s) / UC Diagnoses   Final diagnoses:  None   Discharge Instructions   None    ED Prescriptions    None     Controlled Substance Prescriptions [place] Controlled Substance Registry consulted? Not Applicable   Raylene Everts, MD 02/21/18 [phone]

## 2018-03-09 ENCOUNTER — Encounter: Payer: Self-pay | Admitting: Internal Medicine

## 2018-04-02 DIAGNOSIS — I214 Non-ST elevation (NSTEMI) myocardial infarction: Secondary | ICD-10-CM

## 2018-04-02 HISTORY — DX: Non-ST elevation (NSTEMI) myocardial infarction: I21.4

## 2018-04-05 ENCOUNTER — Emergency Department (HOSPITAL_COMMUNITY): Payer: Medicare HMO

## 2018-04-05 ENCOUNTER — Other Ambulatory Visit: Payer: Self-pay

## 2018-04-05 ENCOUNTER — Inpatient Hospital Stay (HOSPITAL_COMMUNITY)
Admission: EM | Admit: 2018-04-05 | Discharge: 2018-04-07 | DRG: 247 | Disposition: A | Discharge status: Home / Self Care | Payer: Medicare HMO | Attending: Cardiovascular Disease | Admitting: Cardiovascular Disease

## 2018-04-05 ENCOUNTER — Encounter (HOSPITAL_COMMUNITY): Payer: Self-pay | Admitting: Emergency Medicine

## 2018-04-05 DIAGNOSIS — I10 Essential (primary) hypertension: Secondary | ICD-10-CM | POA: Diagnosis present

## 2018-04-05 DIAGNOSIS — Z881 Allergy status to other antibiotic agents status: Secondary | ICD-10-CM

## 2018-04-05 DIAGNOSIS — Z8249 Family history of ischemic heart disease and other diseases of the circulatory system: Secondary | ICD-10-CM

## 2018-04-05 DIAGNOSIS — E7800 Pure hypercholesterolemia, unspecified: Secondary | ICD-10-CM | POA: Diagnosis not present

## 2018-04-05 DIAGNOSIS — Z91018 Allergy to other foods: Secondary | ICD-10-CM

## 2018-04-05 DIAGNOSIS — I499 Cardiac arrhythmia, unspecified: Secondary | ICD-10-CM | POA: Diagnosis not present

## 2018-04-05 DIAGNOSIS — R55 Syncope and collapse: Secondary | ICD-10-CM | POA: Diagnosis not present

## 2018-04-05 DIAGNOSIS — I25110 Atherosclerotic heart disease of native coronary artery with unstable angina pectoris: Secondary | ICD-10-CM | POA: Diagnosis not present

## 2018-04-05 DIAGNOSIS — Z955 Presence of coronary angioplasty implant and graft: Secondary | ICD-10-CM

## 2018-04-05 DIAGNOSIS — I2510 Atherosclerotic heart disease of native coronary artery without angina pectoris: Secondary | ICD-10-CM | POA: Diagnosis not present

## 2018-04-05 DIAGNOSIS — I214 Non-ST elevation (NSTEMI) myocardial infarction: Secondary | ICD-10-CM | POA: Diagnosis present

## 2018-04-05 DIAGNOSIS — E785 Hyperlipidemia, unspecified: Secondary | ICD-10-CM | POA: Diagnosis not present

## 2018-04-05 DIAGNOSIS — R531 Weakness: Secondary | ICD-10-CM | POA: Diagnosis not present

## 2018-04-05 DIAGNOSIS — F17210 Nicotine dependence, cigarettes, uncomplicated: Secondary | ICD-10-CM | POA: Diagnosis present

## 2018-04-05 DIAGNOSIS — R079 Chest pain, unspecified: Secondary | ICD-10-CM | POA: Diagnosis not present

## 2018-04-05 DIAGNOSIS — I340 Nonrheumatic mitral (valve) insufficiency: Secondary | ICD-10-CM | POA: Diagnosis not present

## 2018-04-05 DIAGNOSIS — I4430 Unspecified atrioventricular block: Secondary | ICD-10-CM | POA: Diagnosis not present

## 2018-04-05 DIAGNOSIS — Z720 Tobacco use: Secondary | ICD-10-CM

## 2018-04-05 DIAGNOSIS — R0789 Other chest pain: Secondary | ICD-10-CM | POA: Diagnosis not present

## 2018-04-05 LAB — BASIC METABOLIC PANEL
Anion gap: 12 (ref 5–15)
BUN: 9 mg/dL (ref 8–23)
CALCIUM: 9.5 mg/dL (ref 8.9–10.3)
CO2: 22 mmol/L (ref 22–32)
CREATININE: 0.97 mg/dL (ref 0.44–1.00)
Chloride: 106 mmol/L (ref 98–111)
GFR calc Af Amer: >60 mL/min (ref >60)
GFR, EST NON AFRICAN AMERICAN: 60 mL/min — AB (ref >60)
Glucose, Bld: 120 mg/dL — ABNORMAL HIGH (ref 70–99)
POTASSIUM: 3.9 mmol/L (ref 3.5–5.1)
SODIUM: 140 mmol/L (ref 135–145)

## 2018-04-05 LAB — HEPATIC FUNCTION PANEL
ALBUMIN: 3.6 g/dL (ref 3.5–5.0)
ALK PHOS: 99 U/L (ref 38–126)
ALT: 9 U/L (ref 0–44)
AST: 15 U/L (ref 15–41)
BILIRUBIN DIRECT: 0.2 mg/dL (ref 0.0–0.2)
BILIRUBIN TOTAL: 0.5 mg/dL (ref 0.3–1.2)
Indirect Bilirubin: 0.3 mg/dL (ref 0.3–0.9)
Total Protein: 7.3 g/dL (ref 6.5–8.1)

## 2018-04-05 LAB — CBC
HCT: 43.5 % (ref 36.0–46.0)
Hemoglobin: 13.5 g/dL (ref 12.0–15.0)
MCH: 27.7 pg (ref 26.0–34.0)
MCHC: 31 g/dL (ref 30.0–36.0)
MCV: 89.1 fL (ref 78.0–100.0)
PLATELETS: 282 10*3/uL (ref 150–400)
RBC: 4.88 MIL/uL (ref 3.87–5.11)
RDW: 16.4 % — AB (ref 11.5–15.5)
WBC: 10.3 10*3/uL (ref 4.0–10.5)

## 2018-04-05 LAB — I-STAT TROPONIN, ED: TROPONIN I, POC: 0.42 ng/mL — AB (ref 0.00–0.08)

## 2018-04-05 LAB — LIPASE, BLOOD: Lipase: 32 U/L (ref 11–51)

## 2018-04-05 MED ORDER — HEPARIN (PORCINE) IN NACL 100-0.45 UNIT/ML-% IJ SOLN
1000.0000 [IU]/h | INTRAMUSCULAR | Status: DC
  Administered 2018-04-05: 750 [IU]/h via INTRAVENOUS
  Filled 2018-04-05: qty 250

## 2018-04-05 MED ORDER — HEPARIN BOLUS VIA INFUSION
3750.0000 [IU] | Freq: Once | INTRAVENOUS | Status: AC
  Administered 2018-04-05: 3750 [IU] via INTRAVENOUS
  Filled 2018-04-05: qty 3750

## 2018-04-05 MED ORDER — NITROGLYCERIN IN D5W 200-5 MCG/ML-% IV SOLN
0.0000 ug/min | Freq: Once | INTRAVENOUS | Status: AC
  Administered 2018-04-05: 5 ug/min via INTRAVENOUS
  Filled 2018-04-05: qty 250

## 2018-04-05 MED ORDER — NITROGLYCERIN 2 % TD OINT: 1.0000 [in_us] | TOPICAL_OINTMENT | Freq: Four times a day (QID) | TRANSDERMAL | Status: DC

## 2018-04-05 NOTE — ED Provider Notes (Signed)
[facility] EMERGENCY DEPARTMENT Provider Note   CSN: [id number] Arrival date & time: 04/05/18  2045     History   Chief Complaint Chief Complaint  Patient presents with  . Chest Pain    HPI Cassandra Santana is a 67 y.o. female.  HPI Patient reports he was at a friend's house visiting.  She was not under stress and she had not eaten recently.  She got a fairly sudden onset of central chest pain that came up all the way to her chest and radiated into her left shoulder and arm.  She had had some nausea and weakness earlier in the day but had resolved.  At that time she had not had chest pain.  Pain persisted until EMS arrived and gave her chewable aspirin.  She reports once she took the aspirin it was gone and has not come back.  She denies she is ever had similar pain.  Patient has family history positive for her brother and her father who died in their 25s of massive heart attacks.  Patient does smoke.  Patient is never been treated for hypertension but admits that on the rare occasion she is checked at the hospital her blood pressure is usually elevated.  She has never followed up on this to determine if she is chronically hypertensive.  She reports she has a blood pressure cuff at home but she never uses it to follow her blood pressure. Past Medical History:  Diagnosis Date  . Colitis   . Internal hemorrhoids   . Rectal polyp    hyperplastic    There are no active problems to display for this patient.   Past Surgical History:  Procedure Laterality Date  . ABDOMINAL HYSTERECTOMY  1993     OB History   None      Home Medications    Prior to Admission medications   Medication Sig Start Date End Date Taking? Authorizing Provider  doxycycline (VIBRAMYCIN) 100 MG capsule Take 1 capsule (100 mg total) by mouth 2 (two) times daily. 02/21/18   Raylene Everts, MD    Family History Family History  Problem Relation Age of Onset  . Kidney failure Mother     . Heart attack Father     Social History Social History   Tobacco Use  . Smoking status: Current Every Day Smoker    Packs/day: 0.50    Types: Cigarettes  . Smokeless tobacco: Never Used  Substance Use Topics  . Alcohol use: No  . Drug use: Yes    Types: Marijuana     Allergies   Keflex [cephalexin] and Tomato   Review of Systems Review of Systems  10 Systems reviewed and are negative for acute change except as noted in the HPI.  Physical Exam Updated Vital Signs BP (!) 187/83 (BP Location: Right Arm)   Pulse 65   Temp 97.8 F (36.6 C) (Oral)   Resp 10   Ht 5\' 3"  (1.6 m)   Wt 62.6 kg   SpO2 98%   BMI 24.45 kg/m   Physical Exam  Constitutional: She is oriented to person, place, and time. She appears well-developed and well-nourished.  HENT:  Head: Normocephalic and atraumatic.  Eyes: Pupils are equal, round, and reactive to light. EOM are normal.  Neck: Neck supple.  Cardiovascular: Normal rate, regular rhythm, normal heart sounds and intact distal pulses.  Pulmonary/Chest: Effort normal and breath sounds normal.  Abdominal: Soft. Bowel sounds are normal. She exhibits no distension.  There is no tenderness.  Musculoskeletal: Normal range of motion. She exhibits no edema or tenderness.  Neurological: She is alert and oriented to person, place, and time. She has normal strength. Coordination normal. GCS eye subscore is 4. GCS verbal subscore is 5. GCS motor subscore is 6.  Skin: Skin is warm, dry and intact.  Psychiatric: She has a normal mood and affect.     ED Treatments / Results  Labs (all labs ordered are listed, but only abnormal results are displayed) Labs Reviewed  BASIC METABOLIC PANEL - Abnormal; Notable for the following components:      Result Value   Glucose, Bld 120 (*)    GFR calc non Af Amer 60 (*)    All other components within normal limits  CBC - Abnormal; Notable for the following components:   RDW 16.4 (*)    All other components  within normal limits  I-STAT TROPONIN, ED - Abnormal; Notable for the following components:   Troponin i, poc 0.42 (*)    All other components within normal limits  LIPASE, BLOOD  HEPATIC FUNCTION PANEL    EKG EKG Interpretation  Date/Time:  Wednesday April 05 2018 20:52:10 EDT Ventricular Rate:  62 PR Interval:    QRS Duration: 86 QT Interval:  407 QTC Calculation: 414 R Axis:   78 Text Interpretation:  Sinus rhythm Probable left atrial enlargement normal, no change from prvious Confirmed by Charlesetta Shanks [id number]) on 04/05/2018 9:04:48 PM   Radiology Dg Chest 2 View  Result Date: 04/05/2018 CLINICAL DATA:  Chest pain EXAM: CHEST - 2 VIEW COMPARISON:  None. FINDINGS: Mild cardiomegaly. Normal vascularity. Interstitial prominence. No focal consolidation or mass. Chronic appearing deformity of the right humeral head is noted. IMPRESSION: Cardiomegaly. Interstitial prominence is nonspecific. Electronically Signed   By: Marybelle Killings M.D.   On: 04/05/2018 21:49    Procedures Procedures (including critical care time) CRITICAL CARE Performed by: Charlesetta Shanks   Total critical care time: 30 minutes  Critical care time was exclusive of separately billable procedures and treating other patients.  Critical care was necessary to treat or prevent imminent or life-threatening deterioration.  Critical care was time spent personally by me on the following activities: development of treatment plan with patient and/or surrogate as well as nursing, discussions with consultants, evaluation of patient's response to treatment, examination of patient, obtaining history from patient or surrogate, ordering and performing treatments and interventions, ordering and review of laboratory studies, ordering and review of radiographic studies, pulse oximetry and re-evaluation of patient's condition. Medications Ordered in ED Medications  nitroGLYCERIN 50 mg in dextrose 5 % 250 mL (0.2 mg/mL) infusion (5  mcg/min Intravenous New Bag/Given 04/05/18 2152)     Initial Impression / Assessment and Plan / ED Course  I have reviewed the triage vital signs and the nursing notes.  Pertinent labs & imaging results that were available during my care of the patient were reviewed by me and considered in my medical decision making (see chart for details).  Clinical Course as of Apr 06 2155  Wed Apr 05, 2018  2128 Consult order placed for cardiology.   [MP]  2153 Consult: Reviewed with cardiology, will evaluate in the emergency department.   [MP]    Clinical Course User Index [MP] Charlesetta Shanks, MD    Final Clinical Impressions(s) / ED Diagnoses   Final diagnoses:  NSTEMI (non-ST elevated myocardial infarction) [facility])  Patient episodes of sudden onset of chest pain.  She has multiple risk  factors for coronary artery disease.  Patient has likely had untreated hypertension.  Blood pressures are moderately elevated from 793 systolic to 903E.  Troponin elevated.  Combined with risk factors, troponin and history findings suggestive of ACS or NSTEMI.  Patient's chest pain was completely resolved by her report from aspirin administered by EMS.  Will start low-dose nitroglycerin drip for blood pressure and heparin for probable NSTEMI.   ED Discharge Orders    None       Charlesetta Shanks, MD 04/05/18 2203

## 2018-04-05 NOTE — ED Notes (Signed)
Patient transported to X-ray 

## 2018-04-05 NOTE — ED Triage Notes (Signed)
Pt BIB [facility], c/o intermittent chest pain that started today on waking. Chest pain worsened while at a friend's house, ? Syncopal event there. Pt A&O x 4 at this time. Denies chest pain. Given 324 mg aspirin PTA. EMS vitals: HR 50-80s, and irregular.

## 2018-04-05 NOTE — ED Notes (Signed)
Troponin results given to Dr. Johnney Killian. RN notified as well

## 2018-04-05 NOTE — H&P (Signed)
Cardiology History & Physical    Patient ID: Cassandra Santana MRN: [id number], DOB: 1951-05-10 Date of Encounter: 04/05/2018, 10:45 PM Primary Physician: Deland Pretty, MD  Chief Complaint: Chest Pain   HPI: Cassandra Santana is a 67 y.o. female with history of htn (on no medications) who experienced chest pain earlier during the day. Sub sternal and ischemic in characteristics. Relieved after patient received asa 324.   The patient has hx of intermittent high bp but on no meds. Active smoker, brother and dad died in 23s of heart. No prior cardiac hx. EKG showed no obvious ischemic changes but troponin was elevated. And there fore patient has an NSTEMI.   Past Medical History:  Diagnosis Date  . Colitis   . Internal hemorrhoids   . Rectal polyp    hyperplastic     Surgical History:  Past Surgical History:  Procedure Laterality Date  . ABDOMINAL HYSTERECTOMY  1993     Home Meds: Prior to Admission medications   Medication Sig Start Date End Date Taking? Authorizing Provider  naproxen sodium (ALEVE) 220 MG tablet Take 220-440 mg by mouth 2 (two) times daily as needed (for pain).   Yes [provider]  doxycycline (VIBRAMYCIN) 100 MG capsule Take 1 capsule (100 mg total) by mouth 2 (two) times daily. Patient not taking: Reported on 04/05/2018 02/21/18   Raylene Everts, MD    Allergies:  Allergies  Allergen Reactions  . Keflex [Cephalexin] Hives  . Tomato Hives and Itching    Social History   Socioeconomic History  . Marital status: Married    Spouse name: Not on file  . Number of children: Not on file  . Years of education: Not on file  . Highest education level: Not on file  Occupational History  . Not on file  Social Needs  . Financial resource strain: Not on file  . Food insecurity:    Worry: Not on file    Inability: Not on file  . Transportation needs:    Medical: Not on file    Non-medical: Not on file  Tobacco Use  . Smoking status: Current  Every Day Smoker    Packs/day: 0.50    Types: Cigarettes  . Smokeless tobacco: Never Used  Substance and Sexual Activity  . Alcohol use: No  . Drug use: Yes    Types: Marijuana  . Sexual activity: Yes    Birth control/protection: Surgical  Lifestyle  . Physical activity:    Days per week: Not on file    Minutes per session: Not on file  . Stress: Not on file  Relationships  . Social connections:    Talks on phone: Not on file    Gets together: Not on file    Attends religious service: Not on file    Active member of club or organization: Not on file    Attends meetings of clubs or organizations: Not on file    Relationship status: Not on file  . Intimate partner violence:    Fear of current or ex partner: Not on file    Emotionally abused: Not on file    Physically abused: Not on file    Forced sexual activity: Not on file  Other Topics Concern  . Not on file  Social History Narrative  . Not on file     Family History  Problem Relation Age of Onset  . Kidney failure Mother   . Heart attack Father     Review  of Systems: All other systems reviewed and are otherwise negative except as noted above.  Labs:   Lab Results  Component Value Date   WBC 10.3 04/05/2018   HGB 13.5 04/05/2018   HCT 43.5 04/05/2018   MCV 89.1 04/05/2018   PLT 282 04/05/2018    Recent Labs  Lab 04/05/18 2058 04/05/18 2119  NA 140  --   K 3.9  --   CL 106  --   CO2 22  --   BUN 9  --   CREATININE 0.97  --   CALCIUM 9.5  --   PROT  --  7.3  BILITOT  --  0.5  ALKPHOS  --  99  ALT  --  9  AST  --  15  GLUCOSE 120*  --    No results for input(s): CKTOTAL, CKMB, TROPONINI in the last 72 hours. No results found for: CHOL, HDL, LDLCALC, TRIG No results found for: DDIMER  Radiology/Studies:  Dg Chest 2 View  Result Date: 04/05/2018 CLINICAL DATA:  Chest pain EXAM: CHEST - 2 VIEW COMPARISON:  None. FINDINGS: Mild cardiomegaly. Normal vascularity. Interstitial prominence. No focal  consolidation or mass. Chronic appearing deformity of the right humeral head is noted. IMPRESSION: Cardiomegaly. Interstitial prominence is nonspecific. Electronically Signed   By: Marybelle Killings M.D.   On: 04/05/2018 21:49   Wt Readings from Last 3 Encounters:  04/05/18 62.6 kg  03/01/13 64.9 kg  02/19/13 64.9 kg    EKG: normal sinus rhythm   Physical Exam: Blood pressure (!) 175/81, pulse (!) 56, temperature 97.8 F (36.6 C), temperature source Oral, resp. rate 12, height 5\' 3"  (1.6 m), weight 62.6 kg, SpO2 99 %. Body mass index is 24.45 kg/m. General: Well developed, well nourished, in no acute distress. Head: Normocephalic, atraumatic, sclera non-icteric, no xanthomas, nares are without discharge.  Neck: Negative for carotid bruits. JVD not elevated. Lungs: Clear bilaterally to auscultation without wheezes, rales, or rhonchi. Breathing is unlabored. Heart: RRR with S1 S2. No murmurs, rubs, or gallops appreciated. Abdomen: Soft, non-tender, non-distended with normoactive bowel sounds. No hepatomegaly. No rebound/guarding. No obvious abdominal masses. Msk:  Strength and tone appear normal for age. Extremities: No clubbing or cyanosis. No edema.  Distal pedal pulses are 2+ and equal bilaterally. Neuro: Alert and oriented X 3. No focal deficit. No facial asymmetry. Moves all extremities spontaneously. Psych:  Responds to questions appropriately with a normal affect.    Assessment and Plan  42F w/ NSTEMI.   Diagnostics: -telemetry overnight -TTE in am -LHC in AM -daily ECG -troponin in am to estimate size of MI Therapeutic: -ASA 325 given, starting 81 daily -Heparin gtt -Atorva 80 -Metoprolol 12.5 BID -will await LHC results prior to initiation of P2Y12 -SL NTG prn chest pain - cardiac rehab - smoking cessation provided from myself - a1c, lipid profile  Signed, Glynda Jaeger, MD 04/05/2018, 10:45 PM

## 2018-04-05 NOTE — Progress Notes (Signed)
ANTICOAGULATION CONSULT NOTE - Initial Consult  Pharmacy Consult for heparin Indication: chest pain/ACS  Allergies  Allergen Reactions  . Keflex [Cephalexin] Hives  . Tomato Hives and Itching    Patient Measurements: Height: 5\' 3"  (160 cm) Weight: 138 lb (62.6 kg) IBW/kg (Calculated) : 52.4 Heparin Dosing Weight: 62.6  Vital Signs: Temp: 97.8 F (36.6 C) (09/04 2053) Temp Source: Oral (09/04 2053) BP: 175/81 (09/04 2145) Pulse Rate: 56 (09/04 2145)  Labs: Recent Labs    04/05/18 2058  HGB 13.5  HCT 43.5  PLT 282  CREATININE 0.97    Estimated Creatinine Clearance: 47.2 mL/min (by C-G formula based on SCr of 0.97 mg/dL).   Medical History: Past Medical History:  Diagnosis Date  . Colitis   . Internal hemorrhoids   . Rectal polyp    hyperplastic    Medications:    Assessment: 67 yo F with a PMH of smoking and untreated HTN admitted 9/4 with chest pain now starting heparin for ACS.  CBC WNL    Goal of Therapy:  Heparin level 0.3-0.7 units/ml Monitor platelets by anticoagulation protocol: Yes   Plan:  Heparin bolus 3750 units IV x1 Heparin 750 units/hr IV infusion  6hr heparin level Daily CBC and heparin level  Harrietta Guardian, PharmD PGY1 Pharmacy Resident 04/05/2018    10:24 PM

## 2018-04-06 ENCOUNTER — Encounter (HOSPITAL_COMMUNITY): Payer: Self-pay | Admitting: Cardiovascular Disease

## 2018-04-06 ENCOUNTER — Encounter (HOSPITAL_COMMUNITY)
Admission: EM | Disposition: A | Discharge status: Home / Self Care | Payer: Self-pay | Source: Home / Self Care | Attending: Cardiovascular Disease

## 2018-04-06 ENCOUNTER — Inpatient Hospital Stay (HOSPITAL_COMMUNITY): Payer: Medicare HMO

## 2018-04-06 DIAGNOSIS — E785 Hyperlipidemia, unspecified: Secondary | ICD-10-CM

## 2018-04-06 DIAGNOSIS — I214 Non-ST elevation (NSTEMI) myocardial infarction: Principal | ICD-10-CM

## 2018-04-06 DIAGNOSIS — I10 Essential (primary) hypertension: Secondary | ICD-10-CM

## 2018-04-06 DIAGNOSIS — I2510 Atherosclerotic heart disease of native coronary artery without angina pectoris: Secondary | ICD-10-CM

## 2018-04-06 DIAGNOSIS — I340 Nonrheumatic mitral (valve) insufficiency: Secondary | ICD-10-CM

## 2018-04-06 HISTORY — PX: LEFT HEART CATH AND CORONARY ANGIOGRAPHY: CATH118249

## 2018-04-06 HISTORY — PX: CORONARY STENT INTERVENTION: CATH118234

## 2018-04-06 LAB — CBC
HEMATOCRIT: 40.9 % (ref 36.0–46.0)
HEMOGLOBIN: 12.8 g/dL (ref 12.0–15.0)
MCH: 27.7 pg (ref 26.0–34.0)
MCHC: 31.3 g/dL (ref 30.0–36.0)
MCV: 88.5 fL (ref 78.0–100.0)
Platelets: 268 10*3/uL (ref 150–400)
RBC: 4.62 MIL/uL (ref 3.87–5.11)
RDW: 16.2 % — ABNORMAL HIGH (ref 11.5–15.5)
WBC: 9.9 10*3/uL (ref 4.0–10.5)

## 2018-04-06 LAB — MRSA PCR SCREENING: MRSA by PCR: NEGATIVE

## 2018-04-06 LAB — ECHOCARDIOGRAM COMPLETE
Height: 63 in
Weight: 2307.2 oz

## 2018-04-06 LAB — PROTIME-INR
INR: 1.01
Prothrombin Time: 13.3 seconds (ref 11.4–15.2)

## 2018-04-06 LAB — HEPARIN LEVEL (UNFRACTIONATED): HEPARIN UNFRACTIONATED: 0.16 [IU]/mL — AB (ref 0.30–0.70)

## 2018-04-06 LAB — BASIC METABOLIC PANEL
ANION GAP: 7 (ref 5–15)
BUN: 7 mg/dL — ABNORMAL LOW (ref 8–23)
CO2: 24 mmol/L (ref 22–32)
Calcium: 9.3 mg/dL (ref 8.9–10.3)
Chloride: 109 mmol/L (ref 98–111)
Creatinine, Ser: 0.87 mg/dL (ref 0.44–1.00)
GFR calc Af Amer: >60 mL/min (ref >60)
GFR calc non Af Amer: >60 mL/min (ref >60)
Glucose, Bld: 89 mg/dL (ref 70–99)
POTASSIUM: 3.6 mmol/L (ref 3.5–5.1)
Sodium: 140 mmol/L (ref 135–145)

## 2018-04-06 LAB — LIPID PANEL
CHOL/HDL RATIO: 5.2 ratio
Cholesterol: 215 mg/dL — ABNORMAL HIGH (ref 0–200)
HDL: 41 mg/dL (ref >40)
LDL CALC: 140 mg/dL — AB (ref 0–99)
Triglycerides: 169 mg/dL — ABNORMAL HIGH (ref <150)
VLDL: 34 mg/dL (ref 0–40)

## 2018-04-06 LAB — TROPONIN I
TROPONIN I: 1.53 ng/mL — AB (ref <0.03)
TROPONIN I: 1.49 ng/mL — AB (ref <0.03)
Troponin I: 1.28 ng/mL (ref <0.03)

## 2018-04-06 LAB — HIV ANTIBODY (ROUTINE TESTING W REFLEX): HIV SCREEN 4TH GENERATION: NONREACTIVE

## 2018-04-06 LAB — POCT ACTIVATED CLOTTING TIME: Activated Clotting Time: 307 seconds

## 2018-04-06 SURGERY — LEFT HEART CATH AND CORONARY ANGIOGRAPHY
Anesthesia: LOCAL

## 2018-04-06 MED ORDER — MIDAZOLAM HCL 2 MG/2ML IJ SOLN
INTRAMUSCULAR | Status: AC
  Filled 2018-04-06: qty 2

## 2018-04-06 MED ORDER — SODIUM CHLORIDE 0.9% FLUSH
3.0000 mL | Freq: Two times a day (BID) | INTRAVENOUS | Status: DC
  Administered 2018-04-07: 3 mL via INTRAVENOUS

## 2018-04-06 MED ORDER — IOPAMIDOL (ISOVUE-370) INJECTION 76%
INTRAVENOUS | Status: DC | PRN
  Administered 2018-04-06: 80 mL via INTRA_ARTERIAL

## 2018-04-06 MED ORDER — IOPAMIDOL (ISOVUE-370) INJECTION 76%
INTRAVENOUS | Status: AC
  Filled 2018-04-06: qty 100

## 2018-04-06 MED ORDER — LIDOCAINE HCL (PF) 1 % IJ SOLN
INTRAMUSCULAR | Status: DC | PRN
  Administered 2018-04-06: 2 mL via INTRADERMAL

## 2018-04-06 MED ORDER — LABETALOL HCL 5 MG/ML IV SOLN: 10.0000 mg | INTRAVENOUS | Status: AC | PRN

## 2018-04-06 MED ORDER — SODIUM CHLORIDE 0.9 % WEIGHT BASED INFUSION
3.0000 mL/kg/h | INTRAVENOUS | Status: DC
  Administered 2018-04-06: 3 mL/kg/h via INTRAVENOUS

## 2018-04-06 MED ORDER — THE SENSUOUS HEART BOOK
Freq: Once | Status: AC
  Administered 2018-04-06: 21:00:00 1
  Filled 2018-04-06: qty 1

## 2018-04-06 MED ORDER — HEPARIN (PORCINE) IN NACL 1000-0.9 UT/500ML-% IV SOLN
INTRAVENOUS | Status: AC
  Filled 2018-04-06: qty 1000

## 2018-04-06 MED ORDER — ASPIRIN EC 81 MG PO TBEC
81.0000 mg | DELAYED_RELEASE_TABLET | Freq: Every day | ORAL | Status: DC
  Administered 2018-04-06 – 2018-04-07 (×2): 81 mg via ORAL
  Filled 2018-04-06 (×2): qty 1

## 2018-04-06 MED ORDER — HYDRALAZINE HCL 20 MG/ML IJ SOLN: 5.0000 mg | INTRAMUSCULAR | Status: AC | PRN

## 2018-04-06 MED ORDER — ONDANSETRON HCL 4 MG/2ML IJ SOLN: 4.0000 mg | Freq: Four times a day (QID) | INTRAMUSCULAR | Status: DC | PRN

## 2018-04-06 MED ORDER — FENTANYL CITRATE (PF) 100 MCG/2ML IJ SOLN
INTRAMUSCULAR | Status: AC
  Filled 2018-04-06: qty 2

## 2018-04-06 MED ORDER — ATORVASTATIN CALCIUM 80 MG PO TABS
80.0000 mg | ORAL_TABLET | Freq: Every day | ORAL | Status: DC
  Administered 2018-04-06 (×2): 80 mg via ORAL
  Filled 2018-04-06: qty 4
  Filled 2018-04-06: qty 1

## 2018-04-06 MED ORDER — SODIUM CHLORIDE 0.9% FLUSH: 3.0000 mL | INTRAVENOUS | Status: DC | PRN

## 2018-04-06 MED ORDER — SODIUM CHLORIDE 0.9 % WEIGHT BASED INFUSION: 1.0000 mL/kg/h | INTRAVENOUS | Status: DC

## 2018-04-06 MED ORDER — TICAGRELOR 90 MG PO TABS
ORAL_TABLET | ORAL | Status: AC
  Filled 2018-04-06: qty 2

## 2018-04-06 MED ORDER — MIDAZOLAM HCL 2 MG/2ML IJ SOLN
INTRAMUSCULAR | Status: DC | PRN
  Administered 2018-04-06: 2 mg via INTRAVENOUS

## 2018-04-06 MED ORDER — SODIUM CHLORIDE 0.9 % IV SOLN: 250.0000 mL | INTRAVENOUS | Status: DC | PRN

## 2018-04-06 MED ORDER — TRAZODONE HCL 50 MG PO TABS
25.0000 mg | ORAL_TABLET | ORAL | Status: AC
  Administered 2018-04-06: 25 mg via ORAL
  Filled 2018-04-06: qty 1

## 2018-04-06 MED ORDER — TICAGRELOR 90 MG PO TABS
90.0000 mg | ORAL_TABLET | Freq: Two times a day (BID) | ORAL | Status: DC
  Administered 2018-04-06 – 2018-04-07 (×2): 90 mg via ORAL
  Filled 2018-04-06 (×2): qty 1

## 2018-04-06 MED ORDER — HEART ATTACK BOUNCING BOOK
Freq: Once | Status: AC
  Administered 2018-04-06: 1
  Filled 2018-04-06: qty 1

## 2018-04-06 MED ORDER — VERAPAMIL HCL 2.5 MG/ML IV SOLN
INTRAVENOUS | Status: DC | PRN
  Administered 2018-04-06: 10 mL via INTRA_ARTERIAL

## 2018-04-06 MED ORDER — LISINOPRIL 5 MG PO TABS
5.0000 mg | ORAL_TABLET | Freq: Every day | ORAL | Status: DC
  Administered 2018-04-06: 5 mg via ORAL
  Filled 2018-04-06 (×2): qty 1

## 2018-04-06 MED ORDER — HYDRALAZINE HCL 20 MG/ML IJ SOLN
10.0000 mg | Freq: Four times a day (QID) | INTRAMUSCULAR | Status: DC | PRN
  Administered 2018-04-06: 10 mg via INTRAVENOUS
  Filled 2018-04-06: qty 1

## 2018-04-06 MED ORDER — NITROGLYCERIN 0.4 MG SL SUBL: 0.4000 mg | SUBLINGUAL_TABLET | SUBLINGUAL | Status: DC | PRN

## 2018-04-06 MED ORDER — METOPROLOL TARTRATE 12.5 MG HALF TABLET
12.5000 mg | ORAL_TABLET | Freq: Two times a day (BID) | ORAL | Status: DC
  Administered 2018-04-06 – 2018-04-07 (×4): 12.5 mg via ORAL
  Filled 2018-04-06 (×4): qty 1

## 2018-04-06 MED ORDER — LIDOCAINE HCL (PF) 1 % IJ SOLN
INTRAMUSCULAR | Status: AC
  Filled 2018-04-06: qty 30

## 2018-04-06 MED ORDER — HEPARIN SODIUM (PORCINE) 1000 UNIT/ML IJ SOLN
INTRAMUSCULAR | Status: DC | PRN
  Administered 2018-04-06: 4000 [IU] via INTRAVENOUS
  Administered 2018-04-06: 3000 [IU] via INTRAVENOUS

## 2018-04-06 MED ORDER — SODIUM CHLORIDE 0.9 % WEIGHT BASED INFUSION: 1.0000 mL/kg/h | INTRAVENOUS | Status: AC

## 2018-04-06 MED ORDER — ACETAMINOPHEN 325 MG PO TABS: 650.0000 mg | ORAL_TABLET | ORAL | Status: DC | PRN

## 2018-04-06 MED ORDER — NICOTINE 14 MG/24HR TD PT24
14.0000 mg | MEDICATED_PATCH | Freq: Every day | TRANSDERMAL | Status: DC
  Administered 2018-04-06 – 2018-04-07 (×2): 14 mg via TRANSDERMAL
  Filled 2018-04-06 (×2): qty 1

## 2018-04-06 MED ORDER — NITROGLYCERIN 1 MG/10 ML FOR IR/CATH LAB
INTRA_ARTERIAL | Status: DC | PRN
  Administered 2018-04-06 (×2): 200 ug via INTRACORONARY

## 2018-04-06 MED ORDER — HEPARIN SODIUM (PORCINE) 1000 UNIT/ML IJ SOLN
INTRAMUSCULAR | Status: AC
  Filled 2018-04-06: qty 1

## 2018-04-06 MED ORDER — HEPARIN (PORCINE) IN NACL 1000-0.9 UT/500ML-% IV SOLN
INTRAVENOUS | Status: DC | PRN
  Administered 2018-04-06 (×2): 500 mL

## 2018-04-06 MED ORDER — SODIUM CHLORIDE 0.9 % IV SOLN
INTRAVENOUS | Status: DC | PRN
  Administered 2018-04-06: 1000 mL via INTRAVENOUS

## 2018-04-06 MED ORDER — VERAPAMIL HCL 2.5 MG/ML IV SOLN
INTRAVENOUS | Status: AC
  Filled 2018-04-06: qty 2

## 2018-04-06 MED ORDER — TICAGRELOR 90 MG PO TABS
ORAL_TABLET | ORAL | Status: DC | PRN
  Administered 2018-04-06: 180 mg via ORAL

## 2018-04-06 MED ORDER — ANGIOPLASTY BOOK
Freq: Once | Status: AC
  Administered 2018-04-06: 1
  Filled 2018-04-06: qty 1

## 2018-04-06 MED ORDER — FENTANYL CITRATE (PF) 100 MCG/2ML IJ SOLN
INTRAMUSCULAR | Status: DC | PRN
  Administered 2018-04-06: 25 ug via INTRAVENOUS

## 2018-04-06 MED ORDER — SODIUM CHLORIDE 0.9% FLUSH: 3.0000 mL | Freq: Two times a day (BID) | INTRAVENOUS | Status: DC

## 2018-04-06 MED ORDER — HEPARIN BOLUS VIA INFUSION
2000.0000 [IU] | Freq: Once | INTRAVENOUS | Status: AC
  Administered 2018-04-06: 2000 [IU] via INTRAVENOUS
  Filled 2018-04-06: qty 2000

## 2018-04-06 SURGICAL SUPPLY — 17 items
BALLN SAPPHIRE 2.0X12 (BALLOONS) ×2
BALLN ~~LOC~~ EMERGE MR 4.0X12 (BALLOONS) ×2
BALLOON SAPPHIRE 2.0X12 (BALLOONS) ×1 IMPLANT
BALLOON ~~LOC~~ EMERGE MR 4.0X12 (BALLOONS) ×1 IMPLANT
CATH IMPULSE 5F ANG/FL3.5 (CATHETERS) ×2 IMPLANT
CATH LAUNCHER 6FR EBU3.5 (CATHETERS) ×2 IMPLANT
DEVICE RAD COMP TR BAND LRG (VASCULAR PRODUCTS) ×2 IMPLANT
GLIDESHEATH SLEND SS 6F .021 (SHEATH) ×2 IMPLANT
GUIDEWIRE INQWIRE 1.5J.035X260 (WIRE) ×1 IMPLANT
INQWIRE 1.5J .035X260CM (WIRE) ×2
KIT ENCORE 26 ADVANTAGE (KITS) ×2 IMPLANT
KIT HEART LEFT (KITS) ×2 IMPLANT
PACK CARDIAC CATHETERIZATION (CUSTOM PROCEDURE TRAY) ×2 IMPLANT
STENT SYNERGY DES 3.5X16 (Permanent Stent) ×2 IMPLANT
TRANSDUCER W/STOPCOCK (MISCELLANEOUS) ×2 IMPLANT
TUBING CIL FLEX 10 FLL-RA (TUBING) ×2 IMPLANT
WIRE COUGAR XT STRL 190CM (WIRE) ×2 IMPLANT

## 2018-04-06 NOTE — ED Notes (Signed)
Attempted report 

## 2018-04-06 NOTE — H&P (View-Only) (Signed)
Progress Note  Patient Name: Cassandra Santana Date of Encounter: 04/06/2018  Primary Cardiologist: No primary care provider on file.  Subjective   No chest pain. Planned for cardiac cath today.   Inpatient Medications    Scheduled Meds: . aspirin EC  81 mg Oral Daily  . atorvastatin  80 mg Oral q1800  . metoprolol tartrate  12.5 mg Oral BID   Continuous Infusions: . sodium chloride 10 mL/hr at 04/06/18 0555  . heparin 1,000 Units/hr (04/06/18 0622)   PRN Meds: sodium chloride, nitroGLYCERIN   Vital Signs    Vitals:   04/06/18 0430 04/06/18 0531 04/06/18 0534 04/06/18 0758  BP: (!) 148/85  (!) 177/99 129/73  Pulse: 73  (!) 55 66  Resp: 16  12 13   Temp:   98.6 F (37 C) 98.8 F (37.1 C)  TempSrc:   Oral Oral  SpO2: 96%  98% 98%  Weight:  65.4 kg    Height:  5\' 3"  (1.6 m)      Intake/Output Summary (Last 24 hours) at 04/06/2018 1016 Last data filed at 04/06/2018 8127 Gross per 24 hour  Intake 160.45 ml  Output 400 ml  Net -239.55 ml   Filed Weights   04/05/18 2051 04/06/18 0531  Weight: 62.6 kg 65.4 kg    Telemetry    SR - Personally Reviewed  ECG    SR - Personally Reviewed  Physical Exam   General: Well developed, well nourished, AA female appearing in no acute distress. Head: Normocephalic, atraumatic.  Neck: Supple without bruits, JVD. Lungs:  Resp regular and unlabored, CTA. Heart: RRR, S1, S2, no murmur; no rub. Abdomen: Soft, non-tender, non-distended with normoactive bowel sounds.  Extremities: No clubbing, cyanosis, edema. Distal pedal pulses are 2+ bilaterally. Neuro: Alert and oriented X 3. Moves all extremities spontaneously. Psych: Normal affect.  Labs    Chemistry Recent Labs  Lab 04/05/18 2058 04/05/18 2119 04/06/18 0454  NA 140  --  140  K 3.9  --  3.6  CL 106  --  109  CO2 22  --  24  GLUCOSE 120*  --  89  BUN 9  --  7*  CREATININE 0.97  --  0.87  CALCIUM 9.5  --  9.3  PROT  --  7.3  --   ALBUMIN  --  3.6  --   AST   --  15  --   ALT  --  9  --   ALKPHOS  --  99  --   BILITOT  --  0.5  --   GFRNONAA 60*  --  >60  GFRAA >60  --  >60  ANIONGAP 12  --  7     Hematology Recent Labs  Lab 04/05/18 2058 04/06/18 0454  WBC 10.3 9.9  RBC 4.88 4.62  HGB 13.5 12.8  HCT 43.5 40.9  MCV 89.1 88.5  MCH 27.7 27.7  MCHC 31.0 31.3  RDW 16.4* 16.2*  PLT 282 268    Cardiac EnzymesNo results for input(s): TROPONINI in the last 168 hours.  Recent Labs  Lab 04/05/18 2111  TROPIPOC 0.42*     BNPNo results for input(s): BNP, PROBNP in the last 168 hours.   DDimer No results for input(s): DDIMER in the last 168 hours.    Radiology    Dg Chest 2 View  Result Date: 04/05/2018 CLINICAL DATA:  Chest pain EXAM: CHEST - 2 VIEW COMPARISON:  None. FINDINGS: Mild cardiomegaly. Normal vascularity. Interstitial prominence. No  focal consolidation or mass. Chronic appearing deformity of the right humeral head is noted. IMPRESSION: Cardiomegaly. Interstitial prominence is nonspecific. Electronically Signed   By: Marybelle Killings M.D.   On: 04/05/2018 21:49    Cardiac Studies   N/a   Patient Profile     67 y.o. female with no reported PMH, but is a smoker who presented chest pain and found to have elevated troponin. Planned for cardiac cath today.   Assessment & Plan    1. NSTEMI: Trop 0.42, none ordered for this morning. Will cycle. No recurrent chest pain. Remains on IV heparin. Planned for cardiac cath today. Echo pending. On ASA.  -- The patient understands that risks included but are not limited to stroke (1 in 1000), death (1 in 49), kidney failure [usually temporary] (1 in 500), bleeding (1 in 200), allergic reaction [possibly serious] (1 in 200).   2. HTN: blood pressures remain elevated. Will add lisinopril 5mg  today. Follow BP  3. HL: LDL 140, added Lipitor 80mg  on admission.   4. Tobacco use: cessation advised.   Signed, Reino Bellis, NP  04/06/2018, 10:16 AM  Pager # [phone]   Agree with  note by Reino Bellis NP-C  Cassandra Santana was admitted with unstable angina.  She had minimally positive enzymes consistent with "non-STEMI".  Her EKG shows no acute changes.  She is on IV heparin and is currently pain-free.  Her exam is benign.  2D echo has been performed this morning.  She is scheduled for diagnostic coronary angiography.The patient understands that risks included but are not limited to stroke (1 in 1000), death (1 in 16), kidney failure [usually temporary] (1 in 500), bleeding (1 in 200), allergic reaction [possibly serious] (1 in 200). The patient understands and agrees to proceed   [name], M.D., [place], [facility], [place], [place] [address] [place], [place]  [address]  [phone] 04/06/2018 10:30 AM   For questions or updates, please contact [place] Please consult [email or web address] for contact info under Cardiology/STEMI.

## 2018-04-06 NOTE — Progress Notes (Signed)
  Echocardiogram 2D Echocardiogram has been performed.  04/06/2018, 11:53 AM

## 2018-04-06 NOTE — Progress Notes (Signed)
[place] for heparin Indication: chest pain/ACS  Allergies  Allergen Reactions  . Keflex [Cephalexin] Hives  . Tomato Hives and Itching    Patient Measurements: Height: 5\' 3"  (160 cm) Weight: 144 lb 3.2 oz (65.4 kg) IBW/kg (Calculated) : 52.4 Heparin Dosing Weight: 62.6  Vital Signs: Temp: 98.6 F (37 C) (09/05 0534) Temp Source: Oral (09/05 0534) BP: 177/99 (09/05 0534) Pulse Rate: 55 (09/05 0534)  Labs: Recent Labs    04/05/18 2058 04/06/18 0454  HGB 13.5 12.8  HCT 43.5 40.9  PLT 282 268  LABPROT  --  13.3  INR  --  1.01  HEPARINUNFRC  --  0.16*  CREATININE 0.97  --     Estimated Creatinine Clearance: 51.9 mL/min (by C-G formula based on SCr of 0.97 mg/dL).  Assessment: 67 y.o. female with chest pain for heparin   Goal of Therapy:  Heparin level 0.3-0.7 units/ml Monitor platelets by anticoagulation protocol: Yes   Plan:  Heparin 2000 units IV bolus, then increase heparin  1000 units/hr Check heparin level in 6 hours.  Phillis Knack, PharmD, BCPS  04/06/2018    6:05 AM

## 2018-04-06 NOTE — ED Notes (Signed)
Pt denies CP at this time 

## 2018-04-06 NOTE — Progress Notes (Signed)
[facility] BAND REMOVAL  LOCATION:    right radial  DEFLATED PER PROTOCOL:    Yes.    TIME BAND OFF / DRESSING APPLIED:    1730    SITE UPON ARRIVAL:    Level 0   SITE AFTER BAND REMOVAL:    Level 0  CIRCULATION SENSATION AND MOVEMENT:    Within Normal Limits   Yes.    COMMENTS:

## 2018-04-06 NOTE — Care Management (Signed)
04-06-18  BENEFITS CHECK :   #  10.  S/W SHANNELL  @ [employer] RX # [phone]   BRILINTA  90 MG BID COVER- YES CO-PAY- $ 8.50 TIER- 3 DRUG PRIOR APPROVAL- NO  LOWE INCOME SUBSITYNE  PREFERRED PHARMACY : ANY RETAIL

## 2018-04-06 NOTE — Interval H&P Note (Signed)
Cath Lab Visit (complete for each Cath Lab visit)  Clinical Evaluation Leading to the Procedure:   ACS: Yes.    Non-ACS:    Anginal Classification: CCS IV  Anti-ischemic medical therapy: Minimal Therapy (1 class of medications)  Non-Invasive Test Results: No non-invasive testing performed  Prior CABG: No previous CABG      History and Physical Interval Note:  04/06/2018 11:45 AM  Cassandra Santana  has presented today for surgery, with the diagnosis of NSTEMI  The various methods of treatment have been discussed with the patient and family. After consideration of risks, benefits and other options for treatment, the patient has consented to  Procedure(s): LEFT HEART CATH AND CORONARY ANGIOGRAPHY (N/A) as a surgical intervention .  The patient's history has been reviewed, patient examined, no change in status, stable for surgery.  I have reviewed the patient's chart and labs.  Questions were answered to the patient's satisfaction.     Sherren Mocha

## 2018-04-06 NOTE — Progress Notes (Addendum)
Progress Note  Patient Name: Cassandra Santana Date of Encounter: 04/06/2018  Primary Cardiologist: No primary care provider on file.  Subjective   No chest pain. Planned for cardiac cath today.   Inpatient Medications    Scheduled Meds: . aspirin EC  81 mg Oral Daily  . atorvastatin  80 mg Oral q1800  . metoprolol tartrate  12.5 mg Oral BID   Continuous Infusions: . sodium chloride 10 mL/hr at 04/06/18 0555  . heparin 1,000 Units/hr (04/06/18 0622)   PRN Meds: sodium chloride, nitroGLYCERIN   Vital Signs    Vitals:   04/06/18 0430 04/06/18 0531 04/06/18 0534 04/06/18 0758  BP: (!) 148/85  (!) 177/99 129/73  Pulse: 73  (!) 55 66  Resp: 16  12 13   Temp:   98.6 F (37 C) 98.8 F (37.1 C)  TempSrc:   Oral Oral  SpO2: 96%  98% 98%  Weight:  65.4 kg    Height:  5\' 3"  (1.6 m)      Intake/Output Summary (Last 24 hours) at 04/06/2018 1016 Last data filed at 04/06/2018 6314 Gross per 24 hour  Intake 160.45 ml  Output 400 ml  Net -239.55 ml   Filed Weights   04/05/18 2051 04/06/18 0531  Weight: 62.6 kg 65.4 kg    Telemetry    SR - Personally Reviewed  ECG    SR - Personally Reviewed  Physical Exam   General: Well developed, well nourished, AA female appearing in no acute distress. Head: Normocephalic, atraumatic.  Neck: Supple without bruits, JVD. Lungs:  Resp regular and unlabored, CTA. Heart: RRR, S1, S2, no murmur; no rub. Abdomen: Soft, non-tender, non-distended with normoactive bowel sounds.  Extremities: No clubbing, cyanosis, edema. Distal pedal pulses are 2+ bilaterally. Neuro: Alert and oriented X 3. Moves all extremities spontaneously. Psych: Normal affect.  Labs    Chemistry Recent Labs  Lab 04/05/18 2058 04/05/18 2119 04/06/18 0454  NA 140  --  140  K 3.9  --  3.6  CL 106  --  109  CO2 22  --  24  GLUCOSE 120*  --  89  BUN 9  --  7*  CREATININE 0.97  --  0.87  CALCIUM 9.5  --  9.3  PROT  --  7.3  --   ALBUMIN  --  3.6  --   AST   --  15  --   ALT  --  9  --   ALKPHOS  --  99  --   BILITOT  --  0.5  --   GFRNONAA 60*  --  >60  GFRAA >60  --  >60  ANIONGAP 12  --  7     Hematology Recent Labs  Lab 04/05/18 2058 04/06/18 0454  WBC 10.3 9.9  RBC 4.88 4.62  HGB 13.5 12.8  HCT 43.5 40.9  MCV 89.1 88.5  MCH 27.7 27.7  MCHC 31.0 31.3  RDW 16.4* 16.2*  PLT 282 268    Cardiac EnzymesNo results for input(s): TROPONINI in the last 168 hours.  Recent Labs  Lab 04/05/18 2111  TROPIPOC 0.42*     BNPNo results for input(s): BNP, PROBNP in the last 168 hours.   DDimer No results for input(s): DDIMER in the last 168 hours.    Radiology    Dg Chest 2 View  Result Date: 04/05/2018 CLINICAL DATA:  Chest pain EXAM: CHEST - 2 VIEW COMPARISON:  None. FINDINGS: Mild cardiomegaly. Normal vascularity. Interstitial prominence. No  focal consolidation or mass. Chronic appearing deformity of the right humeral head is noted. IMPRESSION: Cardiomegaly. Interstitial prominence is nonspecific. Electronically Signed   By: Marybelle Killings M.D.   On: 04/05/2018 21:49    Cardiac Studies   N/a   Patient Profile     66 y.o. female with no reported PMH, but is a smoker who presented chest pain and found to have elevated troponin. Planned for cardiac cath today.   Assessment & Plan    1. NSTEMI: Trop 0.42, none ordered for this morning. Will cycle. No recurrent chest pain. Remains on IV heparin. Planned for cardiac cath today. Echo pending. On ASA.  -- The patient understands that risks included but are not limited to stroke (1 in 1000), death (1 in 66), kidney failure [usually temporary] (1 in 500), bleeding (1 in 200), allergic reaction [possibly serious] (1 in 200).   2. HTN: blood pressures remain elevated. Will add lisinopril 5mg  today. Follow BP  3. HL: LDL 140, added Lipitor 80mg  on admission.   4. Tobacco use: cessation advised.   Signed, Reino Bellis, NP  04/06/2018, 10:16 AM  Pager # [phone]   Agree with  note by Reino Bellis NP-C  Ms. Fyfe was admitted with unstable angina.  She had minimally positive enzymes consistent with "non-STEMI".  Her EKG shows no acute changes.  She is on IV heparin and is currently pain-free.  Her exam is benign.  2D echo has been performed this morning.  She is scheduled for diagnostic coronary angiography.The patient understands that risks included but are not limited to stroke (1 in 1000), death (1 in 55), kidney failure [usually temporary] (1 in 500), bleeding (1 in 200), allergic reaction [possibly serious] (1 in 200). The patient understands and agrees to proceed   [name], M.D., [place], [facility], [place], [place] [address] [place], [place]  [address]  [phone] 04/06/2018 10:30 AM   For questions or updates, please contact [place] Please consult [email or web address] for contact info under Cardiology/STEMI.

## 2018-04-06 NOTE — ED Notes (Signed)
Pt placed in hospital bed

## 2018-04-07 ENCOUNTER — Telehealth: Payer: Self-pay | Admitting: Adult Health

## 2018-04-07 ENCOUNTER — Other Ambulatory Visit: Payer: Self-pay | Admitting: Cardiology

## 2018-04-07 DIAGNOSIS — Z720 Tobacco use: Secondary | ICD-10-CM

## 2018-04-07 DIAGNOSIS — I10 Essential (primary) hypertension: Secondary | ICD-10-CM

## 2018-04-07 DIAGNOSIS — R001 Bradycardia, unspecified: Secondary | ICD-10-CM

## 2018-04-07 DIAGNOSIS — E7800 Pure hypercholesterolemia, unspecified: Secondary | ICD-10-CM

## 2018-04-07 DIAGNOSIS — E785 Hyperlipidemia, unspecified: Secondary | ICD-10-CM

## 2018-04-07 LAB — BASIC METABOLIC PANEL
Anion gap: 7 (ref 5–15)
BUN: 8 mg/dL (ref 8–23)
CHLORIDE: 109 mmol/L (ref 98–111)
CO2: 23 mmol/L (ref 22–32)
CREATININE: 0.95 mg/dL (ref 0.44–1.00)
Calcium: 9.6 mg/dL (ref 8.9–10.3)
Glucose, Bld: 91 mg/dL (ref 70–99)
POTASSIUM: 3.2 mmol/L — AB (ref 3.5–5.1)
SODIUM: 139 mmol/L (ref 135–145)

## 2018-04-07 LAB — CBC
HCT: 41.1 % (ref 36.0–46.0)
Hemoglobin: 13.1 g/dL (ref 12.0–15.0)
MCH: 27.6 pg (ref 26.0–34.0)
MCHC: 31.9 g/dL (ref 30.0–36.0)
MCV: 86.7 fL (ref 78.0–100.0)
Platelets: 274 10*3/uL (ref 150–400)
RBC: 4.74 MIL/uL (ref 3.87–5.11)
RDW: 16.1 % — ABNORMAL HIGH (ref 11.5–15.5)
WBC: 9.2 10*3/uL (ref 4.0–10.5)

## 2018-04-07 MED ORDER — ATORVASTATIN CALCIUM 80 MG PO TABS: 80.0000 mg | ORAL_TABLET | Freq: Every day | ORAL | 2 refills | Status: DC

## 2018-04-07 MED ORDER — LISINOPRIL 10 MG PO TABS
10.0000 mg | ORAL_TABLET | Freq: Every day | ORAL | Status: DC
  Administered 2018-04-07: 10:00:00 10 mg via ORAL
  Filled 2018-04-07: qty 1

## 2018-04-07 MED ORDER — NICOTINE 14 MG/24HR TD PT24: 14.0000 mg | MEDICATED_PATCH | Freq: Every day | TRANSDERMAL | 0 refills | Status: DC

## 2018-04-07 MED ORDER — LISINOPRIL 10 MG PO TABS: 10.0000 mg | ORAL_TABLET | Freq: Every day | ORAL | 2 refills | Status: DC

## 2018-04-07 MED ORDER — ASPIRIN 81 MG PO TBEC: 81.0000 mg | DELAYED_RELEASE_TABLET | Freq: Every day | ORAL | Status: AC

## 2018-04-07 MED ORDER — POTASSIUM CHLORIDE CRYS ER 20 MEQ PO TBCR
60.0000 meq | EXTENDED_RELEASE_TABLET | Freq: Once | ORAL | Status: AC
  Administered 2018-04-07: 10:00:00 60 meq via ORAL
  Filled 2018-04-07: qty 3

## 2018-04-07 MED ORDER — METOPROLOL TARTRATE 25 MG PO TABS: 12.5000 mg | ORAL_TABLET | Freq: Two times a day (BID) | ORAL | 2 refills | Status: DC

## 2018-04-07 MED ORDER — NITROGLYCERIN 0.4 MG SL SUBL: 0.4000 mg | SUBLINGUAL_TABLET | SUBLINGUAL | 1 refills | Status: DC | PRN

## 2018-04-07 MED ORDER — TICAGRELOR 90 MG PO TABS: 90.0000 mg | ORAL_TABLET | Freq: Two times a day (BID) | ORAL | 3 refills | Status: DC

## 2018-04-07 MED FILL — BRILINTA 90 MG TABLET: 90 | 30 days supply | Qty: 60 | Fill #0

## 2018-04-07 MED FILL — LISINOPRIL 10 MG TABS: 10 | 30 days supply | Qty: 30 | Fill #0

## 2018-04-07 MED FILL — NICOTINE 14 MG/24HR PATCH: 14 | 14 days supply | Qty: 14 | Fill #0

## 2018-04-07 MED FILL — NITROGLYCERIN 0.4 MG TAB SL: 0.4 | 25 days supply | Qty: 25 | Fill #0

## 2018-04-07 MED FILL — METOPROLOL TARTRATE 25 MG T: 25 | 30 days supply | Qty: 60 | Fill #0

## 2018-04-07 MED FILL — ATORVASTATIN CALCIUM 80 MG: 80 | 30 days supply | Qty: 30 | Fill #0

## 2018-04-07 NOTE — Discharge Summary (Addendum)
Discharge Summary    Patient ID: Cassandra Santana,  MRN: [id number], DOB/AGE: April 05, 1951 67 y.o.  Admit date: 04/05/2018 Discharge date: 04/07/2018  Primary Care Provider: Deland Pretty Primary Cardiologist: Dr. Gwenlyn Found   Discharge Diagnoses    Active Problems:   NSTEMI (non-ST elevated myocardial infarction) [facility])   Hypertension   Hyperlipidemia   Tobacco use   Allergies Allergies  Allergen Reactions  . Keflex [Cephalexin] Hives  . Tomato Hives and Itching    Diagnostic Studies/Procedures    Cath: 04/06/18    Prox Cx to Mid Cx lesion is 95% stenosed.  Prox LAD lesion is 25% stenosed.  Mid RCA lesion is 40% stenosed.  Ost RPDA to RPDA lesion is 70% stenosed.  A drug-eluting stent was successfully placed using a STENT SYNERGY DES 3.5X16.  Post intervention, there is a 0% residual stenosis.   1.  Critical stenosis of the mid circumflex treated successfully with PCI using a 3.5 x 16 mm Synergy DES 2.  Mild to moderate stenosis of the RCA with diffuse plaquing in the RCA proper and moderate stenosis of the proximal PDA 3.  Widely patent left main and LAD with minor nonobstructive stenosis 4.  Normal LV systolic function by echo 5.  Normal LVEDP  Recommend uninterrupted dual antiplatelet therapy with Aspirin 81mg  daily and Ticagrelor 90mg  twice daily for a minimum of 12 months (ACS - Class I recommendation).   Medical therapy for residual CAD  TTE: 04/06/18  Study Conclusions  - Left ventricle: The cavity size was normal. There was moderate   concentric hypertrophy. Systolic function was vigorous. The   estimated ejection fraction was in the range of 65% to 70%. Wall   motion was normal; there were no regional wall motion   abnormalities. Doppler parameters are consistent with abnormal   left ventricular relaxation (grade 1 diastolic dysfunction).   There was no evidence of elevated ventricular filling pressure by   Doppler parameters. - Aortic valve:  Trileaflet; mildly thickened, mildly calcified   leaflets. There was no regurgitation. - Mitral valve: There was mild regurgitation. - Left atrium: The atrium was normal in size. - Right ventricle: Systolic function was normal. - Right atrium: The atrium was normal in size. - Tricuspid valve: There was mild regurgitation. - Pulmonary arteries: Systolic pressure was within the normal   range. - Inferior vena cava: The vessel was normal in size. - Pericardium, extracardiac: There was no pericardial effusion. _____________   History of Present Illness      67 y.o. female with history of htn (on no medications) who experienced chest pain the day of admission. Sub sternal and ischemic in characteristics. Relieved after patient received asa 324.   The patient has hx of intermittent high bp but on no meds. Active smoker, brother and dad died in 25s of heart. No prior cardiac hx that she was aware of. EKG showed no obvious ischemic changes but troponin was elevated. She was placed on IV heparin and admitted for further work up.    Hospital Course     Underwent cardiac cath noted above with critical stenosis of the mid circumflex treated successfully with PCI using a 3.5 x 16 mm Synergy DES. Did have residual disease in the RCA but non obstructive and plan to treat medically. Placed on DAPT with ASA/Brilinta post cath for at least one year. LDL noted at 140 and placed on high dose statin. Also added low dose metoprolol at 12.5mg  BID and lisinopril which was  titrated to 10mg  daily. Noted to have one episode on telemetry appearing to be a pause vs artifact. Patient was conversing with the RN when this was noted and asymptomatic. Discussed with Dr. Gwenlyn Found and will continue BB at current dose and arrange for outpatient cardiac monitor at the time of discharge. Worked well with cardiac rehab without recurrent chest pain. Smoking cessation advised. She was instructed not to drive until her follow up appt by  Dr. Gwenlyn Found.  General: Well developed, well nourished, female appearing in no acute distress. Head: Normocephalic, atraumatic.  Neck: Supple without bruits, JVD. Lungs:  Resp regular and unlabored, CTA. Heart: RRR, S1, S2, no murmur; no rub. Abdomen: Soft, non-tender, non-distended with normoactive bowel sounds. No hepatomegaly. No rebound/guarding. No obvious abdominal masses. Extremities: No clubbing, cyanosis, edema. Distal pedal pulses are 2+ bilaterally. R radial cath site stable without bruising or hematoma Neuro: Alert and oriented X 3. Moves all extremities spontaneously. Psych: Normal affect.  Cassandra Santana was seen by Dr. Gwenlyn Found and determined stable for discharge home. Follow up in the office has been arranged. Medications are listed below.   _____________  Discharge Vitals Blood pressure (!) 167/84, pulse 89, temperature 98.5 F (36.9 C), temperature source Oral, resp. rate 13, height 5\' 3"  (1.6 m), weight 66 kg, SpO2 97 %.  Filed Weights   04/06/18 0531 04/06/18 1101 04/07/18 0318  Weight: 65.4 kg 65.4 kg 66 kg    Labs & Radiologic Studies    CBC Recent Labs    04/06/18 0454 04/07/18 0308  WBC 9.9 9.2  HGB 12.8 13.1  HCT 40.9 41.1  MCV 88.5 86.7  PLT 268 993   Basic Metabolic Panel Recent Labs    04/06/18 0454 04/07/18 0308  NA 140 139  K 3.6 3.2*  CL 109 109  CO2 24 23  GLUCOSE 89 91  BUN 7* 8  CREATININE 0.87 0.95  CALCIUM 9.3 9.6   Liver Function Tests Recent Labs    04/05/18 2119  AST 15  ALT 9  ALKPHOS 99  BILITOT 0.5  PROT 7.3  ALBUMIN 3.6   Recent Labs    04/05/18 2119  LIPASE 32   Cardiac Enzymes Recent Labs    04/06/18 1055 04/06/18 1519 04/06/18 2141  TROPONINI 1.53* 1.49* 1.28*   BNP Invalid input(s): POCBNP D-Dimer No results for input(s): DDIMER in the last 72 hours. Hemoglobin A1C No results for input(s): HGBA1C in the last 72 hours. Fasting Lipid Panel Recent Labs    04/06/18 0455  CHOL 215*  HDL 41    LDLCALC 140*  TRIG 169*  CHOLHDL 5.2   Thyroid Function Tests No results for input(s): TSH, T4TOTAL, T3FREE, THYROIDAB in the last 72 hours.  Invalid input(s): FREET3 _____________  Dg Chest 2 View  Result Date: 04/05/2018 CLINICAL DATA:  Chest pain EXAM: CHEST - 2 VIEW COMPARISON:  None. FINDINGS: Mild cardiomegaly. Normal vascularity. Interstitial prominence. No focal consolidation or mass. Chronic appearing deformity of the right humeral head is noted. IMPRESSION: Cardiomegaly. Interstitial prominence is nonspecific. Electronically Signed   By: Marybelle Killings M.D.   On: 04/05/2018 21:49   Disposition   Pt is being discharged home today in good condition.  Follow-up Plans & Appointments    Follow-up Information    Lendon Colonel, NP Follow up on 04/24/2018.   Specialties:  Nurse Practitioner, Radiology, Cardiology Why:  at 9:30am for your follow up appt.  Contact information: [address] [place] [address] [phone]  [place] Office Follow up on 04/13/2018.   Specialty:  Cardiology Why:  at 12pm to have your cardiac monitor placed.  Contact information: [address] [place] [phone]         Discharge Instructions    Amb Referral to Cardiac Rehabilitation   Complete by:  As directed    Diagnosis:   NSTEMI Coronary Stents     Call MD for:  redness, tenderness, or signs of infection (pain, swelling, redness, odor or green/yellow discharge around incision site)   Complete by:  As directed    Diet - low sodium heart healthy   Complete by:  As directed    Discharge instructions   Complete by:  As directed    Radial Site Care Refer to this sheet in the next few weeks. These instructions provide you with information on caring for yourself after your procedure. Your caregiver may also give you more specific instructions. Your treatment has been planned according to current  medical practices, but problems sometimes occur. Call your caregiver if you have any problems or questions after your procedure. HOME CARE INSTRUCTIONS You may shower the day after the procedure.Remove the bandage (dressing) and gently wash the site with plain soap and water.Gently pat the site dry.  Do not apply powder or lotion to the site.  Do not submerge the affected site in water for 3 to 5 days.  Inspect the site at least twice daily.  Do not flex or bend the affected arm for 24 hours.  No lifting over 5 pounds (2.3 kg) for 10 days after your procedure.  Do not drive home if you are discharged the same day of the procedure. Have someone else drive you.  NO DRIVING UNTIL YOUR FOLLOW UP APPT!!!! What to expect: Any bruising will usually fade within 1 to 2 weeks.  Blood that collects in the tissue (hematoma) may be painful to the touch. It should usually decrease in size and tenderness within 1 to 2 weeks.  SEEK IMMEDIATE MEDICAL CARE IF: You have unusual pain at the radial site.  You have redness, warmth, swelling, or pain at the radial site.  You have drainage (other than a small amount of blood on the dressing).  You have chills.  You have a fever or persistent symptoms for more than 72 hours.  You have a fever and your symptoms suddenly get worse.  Your arm becomes pale, cool, tingly, or numb.  You have heavy bleeding from the site. Hold pressure on the site.   PLEASE DO NOT MISS ANY DOSES OF YOUR BRILINTA!!!!! Also keep a log of you blood pressures and bring back to your follow up appt. Please call the office with any questions.   Patients taking blood thinners should generally stay away from medicines like ibuprofen, Advil, Motrin, naproxen, and Aleve due to risk of stomach bleeding. You may take Tylenol as directed or talk to your primary doctor about alternatives.   Increase activity slowly   Complete by:  As directed        Discharge Medications     Medication List      STOP taking these medications   doxycycline 100 MG capsule Commonly known as:  VIBRAMYCIN   naproxen sodium 220 MG tablet Commonly known as:  ALEVE     TAKE these medications   aspirin 81 MG EC tablet Take 1 tablet (81 mg total) by mouth daily. Start taking on:  04/08/2018  atorvastatin 80 MG tablet Commonly known as:  LIPITOR Take 1 tablet (80 mg total) by mouth daily at 6 PM.   lisinopril 10 MG tablet Commonly known as:  PRINIVIL,ZESTRIL Take 1 tablet (10 mg total) by mouth daily. Start taking on:  04/08/2018   metoprolol tartrate 25 MG tablet Commonly known as:  LOPRESSOR Take 0.5 tablets (12.5 mg total) by mouth 2 (two) times daily.   nicotine 14 mg/24hr patch Commonly known as:  NICODERM CQ - dosed in mg/24 hours Place 1 patch (14 mg total) onto the skin daily. Start taking on:  04/08/2018   nitroGLYCERIN 0.4 MG SL tablet Commonly known as:  NITROSTAT Place 1 tablet (0.4 mg total) under the tongue every 5 (five) minutes x 3 doses as needed for chest pain.   ticagrelor 90 MG Tabs tablet Commonly known as:  BRILINTA Take 1 tablet (90 mg total) by mouth 2 (two) times daily.        Acute coronary syndrome (MI, NSTEMI, STEMI, etc) this admission?: Yes.     AHA/ACC Clinical Performance & Quality Measures: 1. Aspirin prescribed? - Yes 2. ADP Receptor Inhibitor (Plavix/Clopidogrel, Brilinta/Ticagrelor or Effient/Prasugrel) prescribed (includes medically managed patients)? - Yes 3. Beta Blocker prescribed? - Yes 4. High Intensity Statin (Lipitor 40-80mg  or Crestor 20-40mg ) prescribed? - Yes 5. EF assessed during THIS hospitalization? - Yes 6. For EF <40%, was ACEI/ARB prescribed? - Yes 7. For EF <40%, Aldosterone Antagonist (Spironolactone or Eplerenone) prescribed? - Not Applicable (EF >/= 27%) 8. Cardiac Rehab Phase II ordered (Included Medically managed Patients)? - Yes    Outstanding Labs/Studies   FLP/LFTs in 6 weeks if tolerating statin.   Duration of  Discharge Encounter   Greater than 30 minutes including physician time.  Signed, Reino Bellis NP-C 04/07/2018, 10:40 AM  Agree with note by Reino Bellis NP-C  POD #1 LCX PCI-DES. Doing well. No CP. Exam benign. Labs OK. DC home.  [name], M.D., [place], [facility], [place] [facility] [address] [place], [place]  [address]  [phone] 04/07/2018 2:06 PM

## 2018-04-07 NOTE — Care Management Note (Signed)
Case Management Note  Patient Details  Name: Ashelyn Mccravy MRN: [id number] Date of Birth: [birth year]  Subjective/Objective:  From home alone, s/p stent intervention, will be on brilinta, she will get her first 24 free thru the transition of care pharmacy with the 30 day coupon.  Her co pay is 8.50 for refills.                  Action/Plan: DC home when ready.  Expected Discharge Date:                  Expected Discharge Plan:  Home/Self Care  In-House Referral:     Discharge planning Services  CM Consult, Medication Assistance  Post Acute Care Choice:    Choice offered to:     DME Arranged:    DME Agency:     HH Arranged:    HH Agency:     Status of Service:  Completed, signed off  If discussed at [employer] of Stay Meetings, dates discussed:    Additional Comments:  Zenon Mayo, RN 04/07/2018, 8:43 AM

## 2018-04-07 NOTE — Telephone Encounter (Signed)
New Message   Patient has a TOC appt scheduled 04/24/2018 with Jory Sims.

## 2018-04-07 NOTE — Consult Note (Signed)
[facility] Primary Care Navigator  04/07/2018  Cassandra Santana [birth year] [id number]   Met withpatientand sister Cassandra Santana) at the bedsideto identify possible discharge needs. Patientreports having"chest pain" that had led to this admission. (NSTEMI- non-ST elevated myocardial infarction, underwent cardiac cath)   Patientendorses Dr. Deland Pretty with [facility] as her primary care provider.   Patient [place] and on [facility] to obtain medications without difficulty. Discussed with patient regarding [facility] Order Delivery service.  Patientreportsmanagingherown medicationsat homestraight out of the containers but plans to use "pill box" system once discharge.   Patient verbalized that she has been driving prior to admission but states that her sister, friend Cassandra Santana) or niece Cassandra Santana) will be able to providetransportation to her doctors' appointments after discharge.  [place] but her niece Cassandra Santana- from [place]) will be coming to assist her through the weekend, then patient's friend will also be there to assist with her care needs at home.  Anticipateddischarge planishomeaccording to patient.  Patientand sister voiced understanding to call primary care provider's office when she returns homefor a post discharge follow-up visit within1- 2weeksor sooner if needs arise.Patient letter (with PCP's contact number) was provided asareminder.   Discussed with patient aboutTHN CM services available for health managementand resourcesat home butshe denies any needsor concerns at this point. Patient states being aware in managing her health issues at home with diet, exercise (walk), medications and follow-up with provider when needed. She mentioned that she is on a patch to quit smoking and will regularly check her blood pressure with her cuff at home and record  results to bring to her doctors' appointment.  Encouraged patientto seekreferral from primary care provider to [facility] care management if deemednecessaryand appropriate for any services in the near future.  [facility] care management information was provided for future needs thatshemay have.  Patienthowever, opted and verbally agreed forEMMI calls to follow-upwith herrecovery at home.  Referral made for [facility] calls after discharge.   For additional questions please contact:  Edwena Felty A. Klaudia Beirne, BSN, RN-BC [facility] PRIMARY CARE Navigator Cell: [phone]

## 2018-04-07 NOTE — Progress Notes (Signed)
CARDIAC REHAB PHASE I   PRE:  Rate/Rhythm: 79 SR  BP:  Supine: 167/84  Sitting:   Standing:    SaO2:   MODE:  Ambulation: 500 ft   POST:  Rate/Rhythm: 92 SR  BP:  Supine:   Sitting: 189/94, 183/75  Standing:    SaO2:  0807-0900 Pt walked 500 ft on RA with steady gait and tolerated well. No CP. BP elevated and RN notified.. MI education completed with pt who voiced understanding. Stressed importance of brilinta with stent. Has brilinta card. Gave pt fake cigarette and encouraged smoking cessation. Encouraged to call 1800quitnow if needed. She plans to use the nicotine patches. She quit once before for a short time on patches. Gave heart  healthy diet and encouraged healthy food choices. Discussed CRP 2 and referred to [facility] program. Reviewed NTG use, ex ed also.   Graylon Good, RN BSN  04/07/2018 8:55 AM

## 2018-04-10 NOTE — Telephone Encounter (Signed)
Patient contacted regarding discharge from [facility] on 04/07/18.  Patient understands to follow up with provider K. Lawrence DNP on 04/24/18 at 0930 at [facility]. Patient understands discharge instructions? YES Patient understands medications and regiment? YES Patient understands to bring all medications to this visit? YEs

## 2018-04-13 ENCOUNTER — Telehealth (HOSPITAL_COMMUNITY): Payer: Self-pay

## 2018-04-13 ENCOUNTER — Ambulatory Visit (INDEPENDENT_AMBULATORY_CARE_PROVIDER_SITE_OTHER): Payer: Medicare HMO

## 2018-04-13 DIAGNOSIS — R001 Bradycardia, unspecified: Secondary | ICD-10-CM

## 2018-04-13 NOTE — Telephone Encounter (Signed)
Pt insurance is active and benefits verified through [employer]. Co-pay $10.00, DED $0.00/$0.00 met, out of pocket $3,400.00/$35.00 met, co-insurance 0%. No pre-authorization. Passport, 04/13/18 @ 1:56PM, REF#[id number] ° °Will contact patient to see if she is interested in the Cardiac Rehab Program. If interested, patient will need to complete follow up appt. Once completed, patient will be contacted for scheduling upon review by the RN Navigator. °

## 2018-04-13 NOTE — Telephone Encounter (Signed)
Called patient to see if she is interested in the Cardiac Rehab Program. Patient stated not at this time, she is currently wearing a heart monitor.  Closed referral

## 2018-04-21 NOTE — Progress Notes (Signed)
Cardiology Office Note   Date:  04/24/2018   ID:  Cassandra Santana, DOB March 09, 1951, MRN [id number]  PCP:  Deland Pretty, MD  Cardiologist:  [facility] Chief Complaint  Patient presents with  . Coronary Artery Disease  . Hypertension  . Hospitalization Follow-up     History of Present Illness: Cassandra Santana is a 67 y.o. female who presents for posthospitalization follow-up in the setting of non-STEMI, with other history to include hypertension, hyperlipidemia, and tobacco abuse.    The patient was discharged on 04/07/2018, after undergoing cardiac catheterization on 04/06/2018, revealing critical stenosis of the mid circumflex which was treated successfully with PCI using drug-eluting stent, patient was also found to have mild to moderate stenosis of the RCA with diffuse plaquing in the RCA proper and moderate stenosis of the proximal PDA.  The patient had widely patent left main and LAD with minor nonobstructive stenosis.  He was recommended for dual antiplatelet therapy with aspirin and ticagrelor 90 mg twice daily for a minimum of 12 months.  Due to bradycardia, a 2 week cardiac monitor was placed to ascertain HR, this was started on  04/07/2018. She requests its removal as the lead has caused considerable itching and skin irritation.   Past Medical History:  Diagnosis Date  . Colitis   . Internal hemorrhoids   . Rectal polyp    hyperplastic    Past Surgical History:  Procedure Laterality Date  . ABDOMINAL HYSTERECTOMY  1993  . CORONARY STENT INTERVENTION N/A 04/06/2018   Procedure: CORONARY STENT INTERVENTION;  Surgeon: Sherren Mocha, MD;  Location: [place] [facility];  Service: Cardiovascular;  Laterality: N/A;  . LEFT HEART CATH AND CORONARY ANGIOGRAPHY N/A 04/06/2018   Procedure: LEFT HEART CATH AND CORONARY ANGIOGRAPHY;  Surgeon: Sherren Mocha, MD;  Location: [place] [facility];  Service: Cardiovascular;  Laterality: N/A;     Current Outpatient Medications  Medication Sig  Dispense Refill  . aspirin EC 81 MG EC tablet Take 1 tablet (81 mg total) by mouth daily.    Marland Kitchen atorvastatin (LIPITOR) 80 MG tablet Take 1 tablet (80 mg total) by mouth daily at 6 PM. 30 tablet 11  . lisinopril (PRINIVIL,ZESTRIL) 10 MG tablet Take 1 tablet (10 mg total) by mouth daily. 30 tablet 11  . metoprolol tartrate (LOPRESSOR) 25 MG tablet Take 0.5 tablets (12.5 mg total) by mouth 2 (two) times daily. 60 tablet 11  . nicotine (NICODERM CQ - DOSED IN MG/24 HOURS) 14 mg/24hr patch Place 1 patch (14 mg total) onto the skin daily. 28 patch 0  . nitroGLYCERIN (NITROSTAT) 0.4 MG SL tablet Place 1 tablet (0.4 mg total) under the tongue every 5 (five) minutes x 3 doses as needed for chest pain. 25 tablet 1  . ticagrelor (BRILINTA) 90 MG TABS tablet Take 1 tablet (90 mg total) by mouth 2 (two) times daily. 60 tablet 3   No current facility-administered medications for this visit.     Allergies:   Keflex [cephalexin] and Tomato    Social History:  The patient  reports that she has been smoking cigarettes. She has been smoking about 0.50 packs per day. She has never used smokeless tobacco. She reports that she has current or past drug history. Drug: Marijuana. She reports that she does not drink alcohol.   Family History:  The patient's family history includes Heart attack in her father; Kidney failure in her mother.    ROS: All other systems are reviewed and negative. Unless otherwise mentioned in H&P  PHYSICAL EXAM: VS:  BP (!) 158/68   Pulse (!) 52   Ht 5\' 3"  (1.6 m)   Wt 142 lb 6.4 oz (64.6 kg)   BMI 25.23 kg/m  , BMI Body mass index is 25.23 kg/m. GEN: Well nourished, well developed, in no acute distress  HEENT: normal  Neck: no JVD, carotid bruits, or masses Cardiac: RRR; 1/6 systolic murmurs, rubs, or gallops,no edema  Respiratory:  clear to auscultation bilaterally, normal work of breathing GI: soft, nontender, nondistended, + BS MS: no deformity or atrophy Cath insertion  site well healed.  Skin: warm and dry, no rash Neuro:  Strength and sensation are intact Psych: euthymic mood, full affect   EKG:  Sinus bradycardia, rate of 52 bpm.   Recent Labs: 04/05/2018: ALT 9 04/07/2018: BUN 8; Creatinine, Ser 0.95; Hemoglobin 13.1; Platelets 274; Potassium 3.2; Sodium 139    Lipid Panel    Component Value Date/Time   CHOL 215 (H) 04/06/2018 0455   TRIG 169 (H) 04/06/2018 0455   HDL 41 04/06/2018 0455   CHOLHDL 5.2 04/06/2018 0455   VLDL 34 04/06/2018 0455   LDLCALC 140 (H) 04/06/2018 0455      Wt Readings from Last 3 Encounters:  04/24/18 142 lb 6.4 oz (64.6 kg)  04/07/18 145 lb 8.1 oz (66 kg)  03/01/13 143 lb (64.9 kg)      Other studies Reviewed: Cardiac Cath 04/06/2018    Prox Cx to Mid Cx lesion is 95% stenosed.  Prox LAD lesion is 25% stenosed.  Mid RCA lesion is 40% stenosed.  Ost RPDA to RPDA lesion is 70% stenosed.  A drug-eluting stent was successfully placed using a STENT SYNERGY DES 3.5X16.  Post intervention, there is a 0% residual stenosis.   1.  Critical stenosis of the mid circumflex treated successfully with PCI using a 3.5 x 16 mm Synergy DES 2.  Mild to moderate stenosis of the RCA with diffuse plaquing in the RCA proper and moderate stenosis of the proximal PDA 3.  Widely patent left main and LAD with minor nonobstructive stenosis 4.  Normal LV systolic function by echo 5.  Normal LVEDP  Recommend uninterrupted dual antiplatelet therapy with Aspirin 81mg  daily and Ticagrelor 90mg  twice daily for a minimum of 12 months (ACS - Class I recommendation).   Medical therapy for residual CAD     ASSESSMENT AND PLAN:  1. CAD: S/P hospitalization for NSTEMI, requiring cardiac cath. This revealed 95% stenosis of the mid Cx and was treated with DES. She had non-obstructive disease in the LAD and RCA. She remains on DAPT with ASA and Brilinta. She offers no complaints of dyspnea, chest pain or bleeding. Continue current  regimen. 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   2. Hyperlipidemia: She has been placed on high dose atrovatstatin with goal of LDL <70. She will need to have follow up labs in 3 months to evaluate her response to treatment. She is to adhere to low cholesterol heart healthy diet.   3.  Bradycardia: She is currently on low dose lopressor. She is wearing cardiac monitor. This can be removed as she is not tolerating it. She will send it back today for download and review. If significant bradycardia is found, will hold off on BB.   4. Tobacco abuse: Smoking cessation is recommended.    Current medicines are reviewed at length with the patient today.    Labs/ tests ordered today include:  None  Phill Myron. West Pugh, ANP, AACC   04/24/2018 9:47 AM    [facility] [address], [place], [place] [address] Phone: [phone]; Fax: [phone]

## 2018-04-24 ENCOUNTER — Encounter: Payer: Self-pay | Admitting: Adult Health

## 2018-04-24 ENCOUNTER — Ambulatory Visit: Payer: Medicare HMO | Admitting: Adult Health

## 2018-04-24 VITALS — BP 158/68 | HR 52 | Ht 63.0 in | Wt 142.4 lb

## 2018-04-24 DIAGNOSIS — I2510 Atherosclerotic heart disease of native coronary artery without angina pectoris: Secondary | ICD-10-CM

## 2018-04-24 DIAGNOSIS — E7800 Pure hypercholesterolemia, unspecified: Secondary | ICD-10-CM | POA: Diagnosis not present

## 2018-04-24 DIAGNOSIS — Z720 Tobacco use: Secondary | ICD-10-CM

## 2018-04-24 DIAGNOSIS — I214 Non-ST elevation (NSTEMI) myocardial infarction: Secondary | ICD-10-CM | POA: Diagnosis not present

## 2018-04-24 DIAGNOSIS — I10 Essential (primary) hypertension: Secondary | ICD-10-CM

## 2018-04-24 MED ORDER — ATORVASTATIN CALCIUM 80 MG PO TABS: 80.0000 mg | ORAL_TABLET | Freq: Every day | ORAL | 11 refills | Status: DC

## 2018-04-24 MED ORDER — TICAGRELOR 90 MG PO TABS: 90.0000 mg | ORAL_TABLET | Freq: Two times a day (BID) | ORAL | 3 refills | Status: DC

## 2018-04-24 MED ORDER — METOPROLOL TARTRATE 25 MG PO TABS: 12.5000 mg | ORAL_TABLET | Freq: Two times a day (BID) | ORAL | 11 refills | Status: DC

## 2018-04-24 MED ORDER — LISINOPRIL 10 MG PO TABS: 10.0000 mg | ORAL_TABLET | Freq: Every day | ORAL | 11 refills | Status: DC

## 2018-04-24 NOTE — Patient Instructions (Signed)
Medication Instructions:  NO CHANGES- Your physician recommends that you continue on your current medications as directed. Please refer to the Current Medication list given to you today.  If you need a refill on your cardiac medications before your next appointment, please call your pharmacy.  Special Instructions: RTW LETTER W/NO RESTRICTIONS  Follow-Up: Your physician wants you to follow-up in: 3 months with DR BERRY, -OR- KATHRYN LAWRENCE DNP(NURSE PRACTITIONER),AACC IF PRIMARY CARDIOLOGIST IS UNAVAILABLE.   Thank you for choosing [facility] at [facility]!!

## 2018-05-01 DIAGNOSIS — R12 Heartburn: Secondary | ICD-10-CM | POA: Diagnosis not present

## 2018-05-01 DIAGNOSIS — I2510 Atherosclerotic heart disease of native coronary artery without angina pectoris: Secondary | ICD-10-CM | POA: Diagnosis not present

## 2018-05-02 ENCOUNTER — Telehealth: Payer: Self-pay | Admitting: Cardiovascular Disease

## 2018-05-02 NOTE — Telephone Encounter (Signed)
Spoke to [place] and they were advised that the pt is on metoprolol 12.5mg  bid.. It was her first time filling it there and they were wanting to be sure that was the correct dose. Advised them to tell her that we are waiting on her monitor results and may not be staying on it if her HR is low so may not want to get 90 day supply.

## 2018-05-02 NOTE — Telephone Encounter (Signed)
New Message °

## 2018-05-15 ENCOUNTER — Encounter: Payer: Self-pay | Admitting: *Deleted

## 2018-07-09 ENCOUNTER — Emergency Department (HOSPITAL_COMMUNITY)
Admission: EM | Admit: 2018-07-09 | Discharge: 2018-07-09 | Disposition: A | Discharge status: Home / Self Care | Payer: Medicare HMO | Attending: Emergency Medicine | Admitting: Emergency Medicine

## 2018-07-09 ENCOUNTER — Other Ambulatory Visit: Payer: Self-pay

## 2018-07-09 ENCOUNTER — Encounter (HOSPITAL_COMMUNITY): Payer: Self-pay

## 2018-07-09 DIAGNOSIS — Z7982 Long term (current) use of aspirin: Secondary | ICD-10-CM | POA: Insufficient documentation

## 2018-07-09 DIAGNOSIS — F1290 Cannabis use, unspecified, uncomplicated: Secondary | ICD-10-CM | POA: Insufficient documentation

## 2018-07-09 DIAGNOSIS — Z87891 Personal history of nicotine dependence: Secondary | ICD-10-CM | POA: Diagnosis not present

## 2018-07-09 DIAGNOSIS — I10 Essential (primary) hypertension: Secondary | ICD-10-CM | POA: Diagnosis not present

## 2018-07-09 DIAGNOSIS — I252 Old myocardial infarction: Secondary | ICD-10-CM | POA: Diagnosis not present

## 2018-07-09 HISTORY — DX: Essential (primary) hypertension: I10

## 2018-07-09 NOTE — ED Triage Notes (Signed)
Pt arrives POV with c/o high blood pressure that she took at work. Pt denies pain, blurred vision shob. Denies any symptoms. Does c/o bilateral hip pain x 1 month. MI in September.

## 2018-07-09 NOTE — Discharge Instructions (Addendum)
Return for any problem.  Follow-up with your regular care provider later this week as previously scheduled.  Keep a home record of your blood pressure over the next several days.  Please provide your regular care provider with this home record.

## 2018-07-09 NOTE — ED Notes (Signed)
Patient verbalizes understanding of discharge instructions. Opportunity for questioning and answers were provided. Armband removed by staff, pt discharged from ED.  

## 2018-07-09 NOTE — ED Provider Notes (Signed)
[facility] EMERGENCY DEPARTMENT Provider Note   CSN: [id number] Arrival date & time: 07/09/18  1554     History   Chief Complaint Chief Complaint  Patient presents with  . Hypertension    HPI Cassandra Santana is a 67 y.o. female.  67 year old female with prior medical history as detailed below presents for evaluation of elevated blood pressure.  Patient is concerned that her blood pressure is running high.  She checked her blood pressure earlier today while at work.  She is a Quarry manager.  Her blood pressure was approximately 200/100.  She reports that this is not normal for her.  She denies associated headache, vision change, chest pain, shortness of breath, dizziness, weakness, or other complaint.  She is taking her currently prescribed antihypertensives.  She denies noncompliance.  She has an appointment later this week with her regular primary care.  She reports that her normal blood pressure is usually less than 150/90.  The history is provided by the patient and medical records.  Hypertension  This is a recurrent problem. The current episode started more than 2 days ago. The problem occurs every several days. The problem has not changed since onset.Pertinent negatives include no chest pain, no abdominal pain, no headaches and no shortness of breath. Nothing aggravates the symptoms. Nothing relieves the symptoms. She has tried nothing for the symptoms.    Past Medical History:  Diagnosis Date  . Colitis   . Hypertension   . Internal hemorrhoids   . Rectal polyp    hyperplastic    Patient Active Problem List   Diagnosis Date Noted  . Hypertension 04/07/2018  . Hyperlipidemia 04/07/2018  . Tobacco use 04/07/2018  . NSTEMI (non-ST elevated myocardial infarction) ([place]) 04/05/2018    Past Surgical History:  Procedure Laterality Date  . ABDOMINAL HYSTERECTOMY  1993  . CORONARY STENT INTERVENTION N/A 04/06/2018   Procedure: CORONARY STENT INTERVENTION;  Surgeon:  Sherren Mocha, MD;  Location: [place] [facility];  Service: Cardiovascular;  Laterality: N/A;  . LEFT HEART CATH AND CORONARY ANGIOGRAPHY N/A 04/06/2018   Procedure: LEFT HEART CATH AND CORONARY ANGIOGRAPHY;  Surgeon: Sherren Mocha, MD;  Location: [place] [facility];  Service: Cardiovascular;  Laterality: N/A;  . ROTATOR CUFF REPAIR Right      OB History   None      Home Medications    Prior to Admission medications   Medication Sig Start Date End Date Taking? Authorizing Provider  aspirin EC 81 MG EC tablet Take 1 tablet (81 mg total) by mouth daily. 04/08/18   Cheryln Manly, NP  atorvastatin (LIPITOR) 80 MG tablet Take 1 tablet (80 mg total) by mouth daily at 6 PM. 04/24/18   Lendon Colonel, NP  lisinopril (PRINIVIL,ZESTRIL) 10 MG tablet Take 1 tablet (10 mg total) by mouth daily. 04/24/18   Lendon Colonel, NP  metoprolol tartrate (LOPRESSOR) 25 MG tablet Take 0.5 tablets (12.5 mg total) by mouth 2 (two) times daily. 04/24/18   Lendon Colonel, NP  nicotine (NICODERM CQ - DOSED IN MG/24 HOURS) 14 mg/24hr patch Place 1 patch (14 mg total) onto the skin daily. 04/08/18   Cheryln Manly, NP  nitroGLYCERIN (NITROSTAT) 0.4 MG SL tablet Place 1 tablet (0.4 mg total) under the tongue every 5 (five) minutes x 3 doses as needed for chest pain. 04/07/18   Cheryln Manly, NP  ticagrelor (BRILINTA) 90 MG TABS tablet Take 1 tablet (90 mg total) by mouth 2 (two)  times daily. 04/24/18   Lendon Colonel, NP    Family History Family History  Problem Relation Age of Onset  . Kidney failure Mother   . Heart attack Father     Social History Social History   Tobacco Use  . Smoking status: Former Smoker    Packs/day: 0.50    Types: Cigarettes    Last attempt to quit: 04/09/2018    Years since quitting: 0.2  . Smokeless tobacco: Never Used  Substance Use Topics  . Alcohol use: No  . Drug use: Yes    Types: Marijuana     Allergies   Keflex [cephalexin] and  Tomato   Review of Systems Review of Systems  Respiratory: Negative for shortness of breath.   Cardiovascular: Negative for chest pain.  Gastrointestinal: Negative for abdominal pain.  Neurological: Negative for headaches.  All other systems reviewed and are negative.    Physical Exam Updated Vital Signs BP (!) 172/92 (BP Location: Right Arm)   Pulse 66   Resp (!) 9   Ht 5\' 3"  (1.6 m)   Wt 63.5 kg   SpO2 100%   BMI 24.80 kg/m   Physical Exam  Constitutional: She is oriented to person, place, and time. She appears well-developed and well-nourished. No distress.  HENT:  Head: Normocephalic and atraumatic.  Mouth/Throat: Oropharynx is clear and moist.  Eyes: Pupils are equal, round, and reactive to light. Conjunctivae and EOM are normal.  Neck: Normal range of motion. Neck supple.  Cardiovascular: Normal rate, regular rhythm and normal heart sounds.  Pulmonary/Chest: Effort normal and breath sounds normal. No respiratory distress.  Abdominal: Soft. She exhibits no distension. There is no tenderness.  Musculoskeletal: Normal range of motion. She exhibits no edema or deformity.  Neurological: She is alert and oriented to person, place, and time.  Skin: Skin is warm and dry.  Psychiatric: She has a normal mood and affect.  Nursing note and vitals reviewed.    ED Treatments / Results  Labs (all labs ordered are listed, but only abnormal results are displayed) Labs Reviewed - No data to display  EKG None  Radiology No results found.  Procedures Procedures (including critical care time)  Medications Ordered in ED Medications - No data to display   Initial Impression / Assessment and Plan / ED Course  I have reviewed the triage vital signs and the nursing notes.  Pertinent labs & imaging results that were available during my care of the patient were reviewed by me and considered in my medical decision making (see chart for details).     MDM  Screen  complete  Patient is presenting for evaluation of asymptomatic hypertension.  Patient reports that her blood pressure has been running high over the last few days.  Patient is without symptoms.  Multiple repeat blood pressures obtained in the ED are consistently in the 109N to 235T systolic in the 73U to 20U diastolic.  Patient has an already scheduled follow-up with her primary care provider later this week.  She understands the need to continue to keep track of her blood pressures at home.  She will provide this information to her regular care provider.  She does not appear to be in need of acute intervention today for management of her blood pressure.  Portance of close follow-up is stressed.  Strict return precautions are given and understood.  Final Clinical Impressions(s) / ED Diagnoses   Final diagnoses:  Hypertension, unspecified type    ED Discharge Orders  None       Valarie Merino, MD 07/09/18 1700

## 2018-07-19 ENCOUNTER — Ambulatory Visit: Payer: Medicare HMO | Admitting: Cardiovascular Disease

## 2018-07-19 ENCOUNTER — Encounter: Payer: Self-pay | Admitting: Cardiovascular Disease

## 2018-07-19 VITALS — BP 168/98 | HR 79 | Ht 63.0 in | Wt 150.0 lb

## 2018-07-19 DIAGNOSIS — I10 Essential (primary) hypertension: Secondary | ICD-10-CM | POA: Diagnosis not present

## 2018-07-19 DIAGNOSIS — I214 Non-ST elevation (NSTEMI) myocardial infarction: Secondary | ICD-10-CM

## 2018-07-19 DIAGNOSIS — I739 Peripheral vascular disease, unspecified: Secondary | ICD-10-CM | POA: Diagnosis not present

## 2018-07-19 DIAGNOSIS — Z720 Tobacco use: Secondary | ICD-10-CM

## 2018-07-19 DIAGNOSIS — E7800 Pure hypercholesterolemia, unspecified: Secondary | ICD-10-CM | POA: Diagnosis not present

## 2018-07-19 DIAGNOSIS — R0989 Other specified symptoms and signs involving the circulatory and respiratory systems: Secondary | ICD-10-CM | POA: Diagnosis not present

## 2018-07-19 LAB — LIPID PANEL
CHOLESTEROL TOTAL: 116 mg/dL (ref 100–199)
Chol/HDL Ratio: 2.9 ratio (ref 0.0–4.4)
HDL: 40 mg/dL (ref >39)
LDL Calculated: 55 mg/dL (ref 0–99)
Triglycerides: 107 mg/dL (ref 0–149)
VLDL Cholesterol Cal: 21 mg/dL (ref 5–40)

## 2018-07-19 MED ORDER — METOPROLOL SUCCINATE ER 25 MG PO TB24: 50.0000 mg | ORAL_TABLET | Freq: Every day | ORAL | 3 refills | Status: DC

## 2018-07-19 MED ORDER — AMLODIPINE BESYLATE 5 MG PO TABS: 5.0000 mg | ORAL_TABLET | Freq: Every day | ORAL | 3 refills | Status: DC

## 2018-07-19 NOTE — Assessment & Plan Note (Signed)
History of essential hypertension her blood pressure measured today of 168/98.  She is on lisinopril.  And will add amlodipine 5 mg a day and Toprol-XL 25 mg a day..  She is aware of salt restriction.

## 2018-07-19 NOTE — Assessment & Plan Note (Signed)
History of non-STEMI with catheterization performed by Dr. Burt Knack 04/06/2018 revealing a high-grade AV groove circumflex stenosis.  She underwent PCI and drug-eluting stenting using a synergy drug-eluting stent.  She did have mild proximal LAD disease and moderate PDA disease as well as normal LV function.  She is on dual antiplatelet therapy including aspirin and Brilinta.  She denies chest pain or shortness of breath.

## 2018-07-19 NOTE — Progress Notes (Signed)
07/19/2018 Cassandra Santana   August 13, 1950  [id number]  Primary Physician Deland Pretty, MD Primary Cardiologist: [name] MD Lupe Carney, [place]  HPI:  Cassandra Santana is a 67 y.o. mildly overweight divorced African-American female with no children who works as a Quarry manager.  This is her first post hospital visit after recently having a non-STEMI 04/05/2018.  She underwent radial diagnostic cath by Dr. Burt Knack the following day revealing a high-grade mid AV groove circumflex which underwent PCI and drug-eluting stenting using a synergy drug-eluting stent.  She had mild disease in approximately the moderate disease of the PDA.  EF was normal.  She is placed on dual antiplatelet therapy including aspirin and Brilinta as well as high-dose statin therapy.  The problems include treated hypertension although suboptimally and hyperlipidemia on high-dose statin therapy although she does complain of some myalgias related to this.   Current Meds  Medication Sig  . Acetaminophen (TYLENOL PO) Take 2 tablets by mouth daily as needed (pain/headache).  Marland Kitchen aspirin EC 81 MG EC tablet Take 1 tablet (81 mg total) by mouth daily.  Marland Kitchen atorvastatin (LIPITOR) 80 MG tablet Take 1 tablet (80 mg total) by mouth daily at 6 PM.  . Calcium Carbonate Antacid (TUMS PO) Take 2 tablets by mouth daily as needed (heartburn).  Marland Kitchen lisinopril (PRINIVIL,ZESTRIL) 10 MG tablet Take 1 tablet (10 mg total) by mouth daily.  . metoprolol tartrate (LOPRESSOR) 25 MG tablet Take 0.5 tablets (12.5 mg total) by mouth 2 (two) times daily.  . nicotine (NICODERM CQ - DOSED IN MG/24 HOURS) 14 mg/24hr patch Place 1 patch (14 mg total) onto the skin daily. (Patient taking differently: Place 14 mg onto the skin daily as needed (smoking cessation). )  . nitroGLYCERIN (NITROSTAT) 0.4 MG SL tablet Place 1 tablet (0.4 mg total) under the tongue every 5 (five) minutes x 3 doses as needed for chest pain.  . ticagrelor (BRILINTA) 90 MG TABS tablet Take 1  tablet (90 mg total) by mouth 2 (two) times daily.     Allergies  Allergen Reactions  . Keflex [Cephalexin] Hives  . Tomato Hives and Itching    Social History   Socioeconomic History  . Marital status: Married    Spouse name: Not on file  . Number of children: Not on file  . Years of education: Not on file  . Highest education level: Not on file  Occupational History  . Not on file  Social Needs  . Financial resource strain: Not on file  . Food insecurity:    Worry: Not on file    Inability: Not on file  . Transportation needs:    Medical: Not on file    Non-medical: Not on file  Tobacco Use  . Smoking status: Former Smoker    Packs/day: 0.50    Types: Cigarettes    Last attempt to quit: 04/09/2018    Years since quitting: 0.2  . Smokeless tobacco: Never Used  Substance and Sexual Activity  . Alcohol use: No  . Drug use: Yes    Types: Marijuana  . Sexual activity: Yes    Birth control/protection: Surgical  Lifestyle  . Physical activity:    Days per week: Not on file    Minutes per session: Not on file  . Stress: Not on file  Relationships  . Social connections:    Talks on phone: Not on file    Gets together: Not on file    Attends religious service: Not  on file    Active member of club or organization: Not on file    Attends meetings of clubs or organizations: Not on file    Relationship status: Not on file  . Intimate partner violence:    Fear of current or ex partner: Not on file    Emotionally abused: Not on file    Physically abused: Not on file    Forced sexual activity: Not on file  Other Topics Concern  . Not on file  Social History Narrative  . Not on file     Review of Systems: General: negative for chills, fever, night sweats or weight changes.  Cardiovascular: negative for chest pain, dyspnea on exertion, edema, orthopnea, palpitations, paroxysmal nocturnal dyspnea or shortness of breath Dermatological: negative for rash Respiratory:  negative for cough or wheezing Urologic: negative for hematuria Abdominal: negative for nausea, vomiting, diarrhea, bright red blood per rectum, melena, or hematemesis Neurologic: negative for visual changes, syncope, or dizziness All other systems reviewed and are otherwise negative except as noted above.    Blood pressure (!) 168/98, pulse 79, height 5\' 3"  (1.6 m), weight 150 lb (68 kg).  General appearance: alert and no distress Neck: no adenopathy, no JVD, supple, symmetrical, trachea midline, thyroid not enlarged, symmetric, no tenderness/mass/nodules and Right carotid bruit Lungs: clear to auscultation bilaterally Heart: regular rate and rhythm, S1, S2 normal, no murmur, click, rub or gallop Extremities: extremities normal, atraumatic, no cyanosis or edema Pulses: 2+ and symmetric Skin: Skin color, texture, turgor normal. No rashes or lesions Neurologic: Alert and oriented X 3, normal strength and tone. Normal symmetric reflexes. Normal coordination and gait  EKG sinus rhythm at 79 without ST or T wave changes.  Personally reviewed this EKG.  ASSESSMENT AND PLAN:   NSTEMI (non-ST elevated myocardial infarction) ([place]) History of non-STEMI with catheterization performed by Dr. Burt Knack 04/06/2018 revealing a high-grade AV groove circumflex stenosis.  She underwent PCI and drug-eluting stenting using a synergy drug-eluting stent.  She did have mild proximal LAD disease and moderate PDA disease as well as normal LV function.  She is on dual antiplatelet therapy including aspirin and Brilinta.  She denies chest pain or shortness of breath.  Hypertension History of essential hypertension her blood pressure measured today of 168/98.  She is on lisinopril.  And will add amlodipine 5 mg a day and Toprol-XL 25 mg a day..  She is aware of salt restriction.    Hyperlipidemia History of hyperlipidemia on high-dose statin therapy.  She does complain of some myalgias.  We will check a fasting lipid  profile today.  She may be a candidate for Repatha.  Tobacco use History of discontinue tobacco abuse at the time of her non-STEMI having smoked 50 pack years.      [name] MD FACP,FACC,FAHA, [facility] 07/19/2018 11:14 AM

## 2018-07-19 NOTE — Assessment & Plan Note (Signed)
History of hyperlipidemia on high-dose statin therapy.  She does complain of some myalgias.  We will check a fasting lipid profile today.  She may be a candidate for Repatha.

## 2018-07-19 NOTE — Assessment & Plan Note (Signed)
History of discontinue tobacco abuse at the time of her non-STEMI having smoked 50 pack years.

## 2018-07-19 NOTE — Patient Instructions (Addendum)
Medication Instructions:  Your physician has recommended you make the following change in your medication:  1) START Norvasc 5mg  tablet by mouth ONCE daily  2) START Toprol-XL 25 mg tablet by mouth ONCE daily   If you need a refill on your cardiac medications before your next appointment, please call your pharmacy.   Lab work: Your physician recommends that you return for lab work in: LIPID  If you have labs (blood work) drawn today and your tests are completely normal, you will receive your results only by: Marland Kitchen MyChart Message (if you have MyChart) OR . A paper copy in the mail If you have any lab test that is abnormal or we need to change your treatment, we will call you to review the results.  Testing/Procedures: Your physician has requested that you have a carotid duplex. This test is an ultrasound of the carotid arteries in your neck. It looks at blood flow through these arteries that supply the brain with blood. Allow one hour for this exam. There are no restrictions or special instructions.  Your physician has requested that you have a lower extremity arterial doppler- During this test, ultrasound is used to evaluate arterial blood flow in the legs. Allow approximately one hour for this exam.  Your physician has requested that you have an ankle brachial index (ABI). During this test an ultrasound and blood pressure cuff are used to evaluate the arteries that supply the arms and legs with blood. Allow thirty minutes for this exam. There are no restrictions or special instructions.   SCHEDULE BEFORE NEXT F/U WITH DR. Gwenlyn Found IN 3 MONTHS   Follow-Up: At [facility], you and your health needs are our priority.  As part of our continuing mission to provide you with exceptional heart care, we have created designated Provider Care Teams.  These Care Teams include your primary Cardiologist (physician) and Advanced Practice Providers (APPs -  Physician Assistants and Nurse Practitioners) who  all work together to provide you with the care you need, when you need it. You will need a follow up appointment in 3 months.  Please call our office 2 months in advance to schedule this appointment.  You may see Quay Burow, MD or one of the following Advanced Practice Providers on your designated Care Team:   Kerin Ransom, PA-C Roby Lofts, [place] . Sande Rives, PA-C  Any Other Special Instructions Will Be Listed Below (If Applicable).

## 2018-07-20 ENCOUNTER — Encounter: Payer: Self-pay | Admitting: *Deleted

## 2018-07-28 ENCOUNTER — Other Ambulatory Visit: Payer: Self-pay | Admitting: Cardiovascular Disease

## 2018-07-28 DIAGNOSIS — Z720 Tobacco use: Secondary | ICD-10-CM

## 2018-07-28 DIAGNOSIS — I739 Peripheral vascular disease, unspecified: Secondary | ICD-10-CM

## 2018-07-28 DIAGNOSIS — R0989 Other specified symptoms and signs involving the circulatory and respiratory systems: Secondary | ICD-10-CM

## 2018-08-03 ENCOUNTER — Ambulatory Visit (HOSPITAL_COMMUNITY)
Admission: RE | Admit: 2018-08-03 | Discharge: 2018-08-03 | Disposition: A | Discharge status: Home / Self Care | Payer: Medicare HMO | Source: Ambulatory Visit | Attending: Cardiology | Admitting: Cardiology

## 2018-08-03 ENCOUNTER — Ambulatory Visit (HOSPITAL_BASED_OUTPATIENT_CLINIC_OR_DEPARTMENT_OTHER)
Admission: RE | Admit: 2018-08-03 | Discharge: 2018-08-03 | Disposition: A | Discharge status: Home / Self Care | Payer: Medicare HMO | Source: Ambulatory Visit | Attending: Cardiology | Admitting: Cardiology

## 2018-08-03 DIAGNOSIS — I10 Essential (primary) hypertension: Secondary | ICD-10-CM | POA: Diagnosis not present

## 2018-08-03 DIAGNOSIS — E7800 Pure hypercholesterolemia, unspecified: Secondary | ICD-10-CM | POA: Insufficient documentation

## 2018-08-03 DIAGNOSIS — R0989 Other specified symptoms and signs involving the circulatory and respiratory systems: Secondary | ICD-10-CM | POA: Diagnosis not present

## 2018-08-03 DIAGNOSIS — I739 Peripheral vascular disease, unspecified: Secondary | ICD-10-CM | POA: Insufficient documentation

## 2018-08-03 DIAGNOSIS — Z720 Tobacco use: Secondary | ICD-10-CM | POA: Insufficient documentation

## 2018-08-08 ENCOUNTER — Other Ambulatory Visit: Payer: Self-pay

## 2018-08-08 ENCOUNTER — Encounter: Payer: Self-pay | Admitting: Cardiovascular Disease

## 2018-08-08 ENCOUNTER — Ambulatory Visit: Payer: Medicare HMO | Admitting: Cardiovascular Disease

## 2018-08-08 DIAGNOSIS — I739 Peripheral vascular disease, unspecified: Secondary | ICD-10-CM | POA: Diagnosis not present

## 2018-08-08 NOTE — H&P (View-Only) (Signed)
08/08/2018 Cassandra Santana   03-22-1951  [id number]  Primary Physician Deland Pretty, MD Primary Cardiologist: [name] MD Lupe Carney, [place]  HPI:  Cassandra Santana is a 68 y.o.  mildly overweight divorced African-American female with no children who works as a [employer] manager.  I last saw her in the office 07/19/2018 This is her first post hospital visit after recently having a non-STEMI 04/05/2018.  She underwent radial diagnostic cath by Dr. Burt Knack the following day revealing a high-grade mid AV groove circumflex which underwent PCI and drug-eluting stenting using a synergy drug-eluting stent.  She had mild disease in approximately the moderate disease of the PDA.  EF was normal.  She is placed on dual antiplatelet therapy including aspirin and Brilinta as well as high-dose statin therapy.  The problems include treated hypertension although suboptimally and hyperlipidemia on high-dose statin therapy although she does complain of some myalgias related to this. Because of plaints of bilateral lower extremity pain she had Doppler studies performed 08/04/2018 revealing a right ABI of 0.71 and a left ABI 0.64.  She had an occluded distal right SFA and an occluded left popliteal artery.  Her symptoms are lifestyle limiting.  She did stop smoking the day after her non-STEMI.  Current Meds  Medication Sig  . Acetaminophen (TYLENOL PO) Take 2 tablets by mouth daily as needed (pain/headache).  Marland Kitchen amLODipine (NORVASC) 5 MG tablet Take 1 tablet (5 mg total) by mouth daily.  Marland Kitchen aspirin EC 81 MG EC tablet Take 1 tablet (81 mg total) by mouth daily.  Marland Kitchen atorvastatin (LIPITOR) 80 MG tablet Take 1 tablet (80 mg total) by mouth daily at 6 PM.  . Calcium Carbonate Antacid (TUMS PO) Take 2 tablets by mouth daily as needed (heartburn).  Marland Kitchen lisinopril (PRINIVIL,ZESTRIL) 10 MG tablet Take 1 tablet (10 mg total) by mouth daily.  . metoprolol succinate (TOPROL-XL) 25 MG 24 hr tablet Take 2 tablets (50 mg total) by mouth  daily. Take with or immediately following a meal.  . metoprolol tartrate (LOPRESSOR) 25 MG tablet Take 0.5 tablets (12.5 mg total) by mouth 2 (two) times daily.  . nicotine (NICODERM CQ - DOSED IN MG/24 HOURS) 14 mg/24hr patch Place 1 patch (14 mg total) onto the skin daily. (Patient taking differently: Place 14 mg onto the skin daily as needed (smoking cessation). )  . nitroGLYCERIN (NITROSTAT) 0.4 MG SL tablet Place 1 tablet (0.4 mg total) under the tongue every 5 (five) minutes x 3 doses as needed for chest pain.  . ticagrelor (BRILINTA) 90 MG TABS tablet Take 1 tablet (90 mg total) by mouth 2 (two) times daily.     Allergies  Allergen Reactions  . Keflex [Cephalexin] Hives  . Tomato Hives and Itching    Social History   Socioeconomic History  . Marital status: Married    Spouse name: Not on file  . Number of children: Not on file  . Years of education: Not on file  . Highest education level: Not on file  Occupational History  . Not on file  Social Needs  . Financial resource strain: Not on file  . Food insecurity:    Worry: Not on file    Inability: Not on file  . Transportation needs:    Medical: Not on file    Non-medical: Not on file  Tobacco Use  . Smoking status: Former Smoker    Packs/day: 0.50    Types: Cigarettes    Last attempt to quit:  04/09/2018    Years since quitting: 0.3  . Smokeless tobacco: Never Used  Substance and Sexual Activity  . Alcohol use: No  . Drug use: Yes    Types: Marijuana  . Sexual activity: Yes    Birth control/protection: Surgical  Lifestyle  . Physical activity:    Days per week: Not on file    Minutes per session: Not on file  . Stress: Not on file  Relationships  . Social connections:    Talks on phone: Not on file    Gets together: Not on file    Attends religious service: Not on file    Active member of club or organization: Not on file    Attends meetings of clubs or organizations: Not on file    Relationship status: Not  on file  . Intimate partner violence:    Fear of current or ex partner: Not on file    Emotionally abused: Not on file    Physically abused: Not on file    Forced sexual activity: Not on file  Other Topics Concern  . Not on file  Social History Narrative  . Not on file     Review of Systems: General: negative for chills, fever, night sweats or weight changes.  Cardiovascular: negative for chest pain, dyspnea on exertion, edema, orthopnea, palpitations, paroxysmal nocturnal dyspnea or shortness of breath Dermatological: negative for rash Respiratory: negative for cough or wheezing Urologic: negative for hematuria Abdominal: negative for nausea, vomiting, diarrhea, bright red blood per rectum, melena, or hematemesis Neurologic: negative for visual changes, syncope, or dizziness All other systems reviewed and are otherwise negative except as noted above.    Blood pressure 120/70, pulse 72, height 5\' 3"  (1.6 m), weight 148 lb (67.1 kg).  General appearance: alert and no distress Neck: no adenopathy, no carotid bruit, no JVD, supple, symmetrical, trachea midline and thyroid not enlarged, symmetric, no tenderness/mass/nodules Lungs: clear to auscultation bilaterally Heart: regular rate and rhythm, S1, S2 normal, no murmur, click, rub or gallop Extremities: extremities normal, atraumatic, no cyanosis or edema Pulses: 2+ and symmetric Skin: Skin color, texture, turgor normal. No rashes or lesions Neurologic: Alert and oriented X 3, normal strength and tone. Normal symmetric reflexes. Normal coordination and gait  EKG not performed today  ASSESSMENT AND PLAN:   Peripheral arterial disease ([place]) [place] returns today for follow-up of her arterial Doppler studies performed in the evaluation of bilateral lower extremity claudication on 08/03/2018.  Her ABIs were 0.71 on the right and 0.64 on the left.  She had an occluded distal right SFA and left popliteal artery.  this is lifestyle  limiting and she wishes this percutaneously addressed.  I will arrange for her to undergo angiography potential endovascular therapy in the next several weeks.      [name] MD FACP,FACC,FAHA, [facility] 08/08/2018 4:41 PM

## 2018-08-08 NOTE — Progress Notes (Signed)
08/08/2018 Cassandra Santana   [birth year]  [id number]  Primary Physician Deland Pretty, MD Primary Cardiologist: [name] MD Lupe Carney, [place]  HPI:  Cassandra Santana is a 68 y.o.  mildly overweight divorced African-American female with no children who works as a [employer] manager.  I last saw her in the office 07/19/2018 This is her first post hospital visit after recently having a non-STEMI 04/05/2018.  She underwent radial diagnostic cath by Dr. Burt Knack the following day revealing a high-grade mid AV groove circumflex which underwent PCI and drug-eluting stenting using a synergy drug-eluting stent.  She had mild disease in approximately the moderate disease of the PDA.  EF was normal.  She is placed on dual antiplatelet therapy including aspirin and Brilinta as well as high-dose statin therapy.  The problems include treated hypertension although suboptimally and hyperlipidemia on high-dose statin therapy although she does complain of some myalgias related to this. Because of plaints of bilateral lower extremity pain she had Doppler studies performed 08/04/2018 revealing a right ABI of 0.71 and a left ABI 0.64.  She had an occluded distal right SFA and an occluded left popliteal artery.  Her symptoms are lifestyle limiting.  She did stop smoking the day after her non-STEMI.  Current Meds  Medication Sig  . Acetaminophen (TYLENOL PO) Take 2 tablets by mouth daily as needed (pain/headache).  Marland Kitchen amLODipine (NORVASC) 5 MG tablet Take 1 tablet (5 mg total) by mouth daily.  Marland Kitchen aspirin EC 81 MG EC tablet Take 1 tablet (81 mg total) by mouth daily.  Marland Kitchen atorvastatin (LIPITOR) 80 MG tablet Take 1 tablet (80 mg total) by mouth daily at 6 PM.  . Calcium Carbonate Antacid (TUMS PO) Take 2 tablets by mouth daily as needed (heartburn).  Marland Kitchen lisinopril (PRINIVIL,ZESTRIL) 10 MG tablet Take 1 tablet (10 mg total) by mouth daily.  . metoprolol succinate (TOPROL-XL) 25 MG 24 hr tablet Take 2 tablets (50 mg total) by mouth  daily. Take with or immediately following a meal.  . metoprolol tartrate (LOPRESSOR) 25 MG tablet Take 0.5 tablets (12.5 mg total) by mouth 2 (two) times daily.  . nicotine (NICODERM CQ - DOSED IN MG/24 HOURS) 14 mg/24hr patch Place 1 patch (14 mg total) onto the skin daily. (Patient taking differently: Place 14 mg onto the skin daily as needed (smoking cessation). )  . nitroGLYCERIN (NITROSTAT) 0.4 MG SL tablet Place 1 tablet (0.4 mg total) under the tongue every 5 (five) minutes x 3 doses as needed for chest pain.  . ticagrelor (BRILINTA) 90 MG TABS tablet Take 1 tablet (90 mg total) by mouth 2 (two) times daily.     Allergies  Allergen Reactions  . Keflex [Cephalexin] Hives  . Tomato Hives and Itching    Social History   Socioeconomic History  . Marital status: Married    Spouse name: Not on file  . Number of children: Not on file  . Years of education: Not on file  . Highest education level: Not on file  Occupational History  . Not on file  Social Needs  . Financial resource strain: Not on file  . Food insecurity:    Worry: Not on file    Inability: Not on file  . Transportation needs:    Medical: Not on file    Non-medical: Not on file  Tobacco Use  . Smoking status: Former Smoker    Packs/day: 0.50    Types: Cigarettes    Last attempt to quit:  04/09/2018    Years since quitting: 0.3  . Smokeless tobacco: Never Used  Substance and Sexual Activity  . Alcohol use: No  . Drug use: Yes    Types: Marijuana  . Sexual activity: Yes    Birth control/protection: Surgical  Lifestyle  . Physical activity:    Days per week: Not on file    Minutes per session: Not on file  . Stress: Not on file  Relationships  . Social connections:    Talks on phone: Not on file    Gets together: Not on file    Attends religious service: Not on file    Active member of club or organization: Not on file    Attends meetings of clubs or organizations: Not on file    Relationship status: Not  on file  . Intimate partner violence:    Fear of current or ex partner: Not on file    Emotionally abused: Not on file    Physically abused: Not on file    Forced sexual activity: Not on file  Other Topics Concern  . Not on file  Social History Narrative  . Not on file     Review of Systems: General: negative for chills, fever, night sweats or weight changes.  Cardiovascular: negative for chest pain, dyspnea on exertion, edema, orthopnea, palpitations, paroxysmal nocturnal dyspnea or shortness of breath Dermatological: negative for rash Respiratory: negative for cough or wheezing Urologic: negative for hematuria Abdominal: negative for nausea, vomiting, diarrhea, bright red blood per rectum, melena, or hematemesis Neurologic: negative for visual changes, syncope, or dizziness All other systems reviewed and are otherwise negative except as noted above.    Blood pressure 120/70, pulse 72, height 5\' 3"  (1.6 m), weight 148 lb (67.1 kg).  General appearance: alert and no distress Neck: no adenopathy, no carotid bruit, no JVD, supple, symmetrical, trachea midline and thyroid not enlarged, symmetric, no tenderness/mass/nodules Lungs: clear to auscultation bilaterally Heart: regular rate and rhythm, S1, S2 normal, no murmur, click, rub or gallop Extremities: extremities normal, atraumatic, no cyanosis or edema Pulses: 2+ and symmetric Skin: Skin color, texture, turgor normal. No rashes or lesions Neurologic: Alert and oriented X 3, normal strength and tone. Normal symmetric reflexes. Normal coordination and gait  EKG not performed today  ASSESSMENT AND PLAN:   Peripheral arterial disease (Cassandra) Santana returns today for follow-up of her arterial Doppler studies performed in the evaluation of bilateral lower extremity claudication on 08/03/2018.  Her ABIs were 0.71 on the right and 0.64 on the left.  She had an occluded distal right SFA and left popliteal artery.  this is lifestyle  limiting and she wishes this percutaneously addressed.  I will arrange for her to undergo angiography potential endovascular therapy in the next several weeks.      [name] MD FACP,FACC,FAHA, [facility] 08/08/2018 4:41 PM

## 2018-08-08 NOTE — Addendum Note (Signed)
Addended by: Annita Brod on: 08/08/2018 05:09 PM   Modules accepted: Orders

## 2018-08-08 NOTE — Patient Instructions (Addendum)
    [address] [place] [address] Dept: [phone] Loc: [place]  08/08/2018  You are scheduled for a Peripheral Angiogram on Monday, January 27 with Dr. Quay Burow.  1. Please arrive at the [facility] (Main Entrance A) at [facility]: [address] [place], [place] [address] at 7:30 AM (This time is two hours before your procedure to ensure your preparation). Free valet parking service is available.   Special note: Every effort is made to have your procedure done on time. Please understand that emergencies sometimes delay scheduled procedures.  2. Diet: Do not eat solid foods after midnight.  The patient may have clear liquids until 5am upon the day of the procedure.  3. Labs: You will need to have blood drawn THIS WEEK (CBC, BMET, TSH) 4. Medication instructions in preparation for your procedure:    On the morning of your procedure, take your Brilinta/Ticagrelor and any morning medicines NOT listed above.  You may use sips of water.  5. Plan for one night stay--bring personal belongings. 6. Bring a current list of your medications and current insurance cards. 7. You MUST have a responsible person to drive you home. 8. Someone MUST be with you the first 24 hours after you arrive home or your discharge will be delayed. 9. Please wear clothes that are easy to get on and off and wear slip-on shoes.  Thank you for allowing [place] to care for you!   -- [place] [facility]  TESTING: Your physician has requested that you have an aorta/iliac duplex. During this test, an ultrasound is used to evaluate blood flow to the aorta and iliac arteries. Allow one hour for this exam. Do not eat after midnight the day before and avoid carbonated beverages.  SCHEDULE THIS TEST 1-2 WEEKS AFTER YOUR PROCEDURE  FOLLOW UP:  FOLLOW UP WITH DR.  Gwenlyn Found 2-3 WEEKS AFTER YOUR PROCEDURE

## 2018-08-08 NOTE — Assessment & Plan Note (Signed)
Cassandra Santana returns today for follow-up of her arterial Doppler studies performed in the evaluation of bilateral lower extremity claudication on 08/03/2018.  Her ABIs were 0.71 on the right and 0.64 on the left.  She had an occluded distal right SFA and left popliteal artery.  this is lifestyle limiting and she wishes this percutaneously addressed.  I will arrange for her to undergo angiography potential endovascular therapy in the next several weeks.

## 2018-08-17 DIAGNOSIS — I10 Essential (primary) hypertension: Secondary | ICD-10-CM | POA: Diagnosis not present

## 2018-08-17 DIAGNOSIS — I739 Peripheral vascular disease, unspecified: Secondary | ICD-10-CM | POA: Diagnosis not present

## 2018-08-17 DIAGNOSIS — I214 Non-ST elevation (NSTEMI) myocardial infarction: Secondary | ICD-10-CM | POA: Diagnosis not present

## 2018-08-18 LAB — BASIC METABOLIC PANEL
BUN/Creatinine Ratio: 7 — ABNORMAL LOW (ref 12–28)
BUN: 6 mg/dL — ABNORMAL LOW (ref 8–27)
CALCIUM: 9.4 mg/dL (ref 8.7–10.3)
CO2: 23 mmol/L (ref 20–29)
CREATININE: 0.82 mg/dL (ref 0.57–1.00)
Chloride: 106 mmol/L (ref 96–106)
GFR calc Af Amer: 86 mL/min/{1.73_m2} (ref >59)
GFR calc non Af Amer: 75 mL/min/{1.73_m2} (ref >59)
GLUCOSE: 96 mg/dL (ref 65–99)
POTASSIUM: 3.8 mmol/L (ref 3.5–5.2)
Sodium: 142 mmol/L (ref 134–144)

## 2018-08-18 LAB — CBC
HEMATOCRIT: 32.5 % — AB (ref 34.0–46.6)
HEMOGLOBIN: 10.4 g/dL — AB (ref 11.1–15.9)
MCH: 26.7 pg (ref 26.6–33.0)
MCHC: 32 g/dL (ref 31.5–35.7)
MCV: 83 fL (ref 79–97)
Platelets: 458 10*3/uL — ABNORMAL HIGH (ref 150–450)
RBC: 3.9 x10E6/uL (ref 3.77–5.28)
RDW: 14.3 % (ref 11.7–15.4)
WBC: 6.8 10*3/uL (ref 3.4–10.8)

## 2018-08-18 LAB — TSH: TSH: 1.32 u[IU]/mL (ref 0.450–4.500)

## 2018-08-21 ENCOUNTER — Telehealth: Payer: Self-pay

## 2018-08-21 MED ORDER — METOPROLOL SUCCINATE ER 25 MG PO TB24: 25.0000 mg | ORAL_TABLET | Freq: Every day | ORAL | 3 refills | Status: DC

## 2018-08-21 NOTE — Telephone Encounter (Signed)
Advised pt to take Toprol XL per Gwenlyn Found, MD recommendations on 07/19/2018 office visit note which is to take Toprol-XL 25 mg daily and to stop taking her Lopressor. Informed pt that Dr. Gwenlyn Found would be consulted and if there are updates in medication, pt would be contacted. Pt verbalized understanding

## 2018-08-24 ENCOUNTER — Telehealth: Payer: Self-pay | Admitting: *Deleted

## 2018-08-24 NOTE — Telephone Encounter (Addendum)
Pt contacted pre-abdominal aortogram scheduled at [facility] for: Monday August 28, 2018 9:30 AM Verified arrival time and place: [place] Entrance A at: 7:30 AM  No solid food after midnight prior to cath, clear liquids until 5 AM day of procedure. Contrast allergy: no Verified no diabetes medications: yes  AM meds can be  taken pre-cath with sip of water including: ASA 81 mg Brilinta 90 mg  Confirmed patient has responsible person to drive home post procedure and for 24 hours after you arrive home: yes

## 2018-08-28 ENCOUNTER — Ambulatory Visit (HOSPITAL_COMMUNITY)
Admission: RE | Admit: 2018-08-28 | Discharge: 2018-08-29 | Disposition: A | Discharge status: Home / Self Care | Payer: Medicare HMO | Attending: Cardiovascular Disease | Admitting: Cardiovascular Disease

## 2018-08-28 ENCOUNTER — Encounter (HOSPITAL_COMMUNITY): Payer: Self-pay | Admitting: Cardiovascular Disease

## 2018-08-28 ENCOUNTER — Encounter (HOSPITAL_COMMUNITY)
Admission: RE | Disposition: A | Discharge status: Home / Self Care | Payer: Self-pay | Source: Home / Self Care | Attending: Cardiovascular Disease

## 2018-08-28 ENCOUNTER — Other Ambulatory Visit: Payer: Self-pay

## 2018-08-28 DIAGNOSIS — Z87891 Personal history of nicotine dependence: Secondary | ICD-10-CM | POA: Diagnosis not present

## 2018-08-28 DIAGNOSIS — Z79899 Other long term (current) drug therapy: Secondary | ICD-10-CM | POA: Insufficient documentation

## 2018-08-28 DIAGNOSIS — Z881 Allergy status to other antibiotic agents status: Secondary | ICD-10-CM | POA: Diagnosis not present

## 2018-08-28 DIAGNOSIS — I70213 Atherosclerosis of native arteries of extremities with intermittent claudication, bilateral legs: Secondary | ICD-10-CM | POA: Diagnosis not present

## 2018-08-28 DIAGNOSIS — I70223 Atherosclerosis of native arteries of extremities with rest pain, bilateral legs: Secondary | ICD-10-CM | POA: Diagnosis not present

## 2018-08-28 DIAGNOSIS — E663 Overweight: Secondary | ICD-10-CM | POA: Insufficient documentation

## 2018-08-28 DIAGNOSIS — Z6825 Body mass index (BMI) 25.0-25.9, adult: Secondary | ICD-10-CM | POA: Insufficient documentation

## 2018-08-28 DIAGNOSIS — I10 Essential (primary) hypertension: Secondary | ICD-10-CM | POA: Diagnosis not present

## 2018-08-28 DIAGNOSIS — I214 Non-ST elevation (NSTEMI) myocardial infarction: Secondary | ICD-10-CM | POA: Diagnosis not present

## 2018-08-28 DIAGNOSIS — Z7982 Long term (current) use of aspirin: Secondary | ICD-10-CM | POA: Insufficient documentation

## 2018-08-28 DIAGNOSIS — I7092 Chronic total occlusion of artery of the extremities: Secondary | ICD-10-CM | POA: Diagnosis not present

## 2018-08-28 DIAGNOSIS — E785 Hyperlipidemia, unspecified: Secondary | ICD-10-CM | POA: Insufficient documentation

## 2018-08-28 DIAGNOSIS — Z720 Tobacco use: Secondary | ICD-10-CM | POA: Diagnosis present

## 2018-08-28 DIAGNOSIS — Z955 Presence of coronary angioplasty implant and graft: Secondary | ICD-10-CM | POA: Insufficient documentation

## 2018-08-28 DIAGNOSIS — I739 Peripheral vascular disease, unspecified: Secondary | ICD-10-CM

## 2018-08-28 HISTORY — DX: Non-ST elevation (NSTEMI) myocardial infarction: I21.4

## 2018-08-28 HISTORY — DX: Peripheral vascular disease, unspecified: I73.9

## 2018-08-28 HISTORY — PX: ABDOMINAL AORTOGRAM W/LOWER EXTREMITY: CATH118223

## 2018-08-28 HISTORY — PX: PERIPHERAL VASCULAR ATHERECTOMY: CATH118256

## 2018-08-28 LAB — POCT ACTIVATED CLOTTING TIME
Activated Clotting Time: 197 seconds
Activated Clotting Time: 246 seconds
Activated Clotting Time: 301 seconds
Activated Clotting Time: 378 seconds
Activated Clotting Time: 208 seconds
Activated Clotting Time: 175 seconds

## 2018-08-28 SURGERY — ABDOMINAL AORTOGRAM W/LOWER EXTREMITY
Anesthesia: LOCAL | Laterality: Left

## 2018-08-28 MED ORDER — TICAGRELOR 90 MG PO TABS
90.0000 mg | ORAL_TABLET | Freq: Two times a day (BID) | ORAL | Status: DC
  Administered 2018-08-28 – 2018-08-29 (×2): 90 mg via ORAL
  Filled 2018-08-28 (×2): qty 1

## 2018-08-28 MED ORDER — HEPARIN SODIUM (PORCINE) 1000 UNIT/ML IJ SOLN
INTRAMUSCULAR | Status: DC | PRN
  Administered 2018-08-28: 4000 [IU] via INTRAVENOUS
  Administered 2018-08-28: 2500 [IU] via INTRAVENOUS
  Administered 2018-08-28: 7500 [IU] via INTRAVENOUS

## 2018-08-28 MED ORDER — ANGIOPLASTY BOOK
Freq: Once | Status: AC
  Administered 2018-08-28: 22:00:00
  Filled 2018-08-28: qty 1

## 2018-08-28 MED ORDER — ACETAMINOPHEN 500 MG PO TABS: 1000.0000 mg | ORAL_TABLET | Freq: Every day | ORAL | Status: DC | PRN

## 2018-08-28 MED ORDER — SODIUM CHLORIDE 0.9% FLUSH
3.0000 mL | Freq: Two times a day (BID) | INTRAVENOUS | Status: DC
  Administered 2018-08-28 (×2): 3 mL via INTRAVENOUS

## 2018-08-28 MED ORDER — MORPHINE SULFATE (PF) 4 MG/ML IV SOLN
4.0000 mg | INTRAVENOUS | Status: DC | PRN
  Administered 2018-08-28: 15:00:00 4 mg via INTRAVENOUS
  Filled 2018-08-28: qty 1

## 2018-08-28 MED ORDER — HEPARIN SODIUM (PORCINE) 1000 UNIT/ML IJ SOLN
INTRAMUSCULAR | Status: AC
  Filled 2018-08-28: qty 2

## 2018-08-28 MED ORDER — MIDAZOLAM HCL 2 MG/2ML IJ SOLN
INTRAMUSCULAR | Status: AC
  Filled 2018-08-28: qty 2

## 2018-08-28 MED ORDER — SODIUM CHLORIDE 0.9 % IV SOLN
INTRAVENOUS | Status: AC
  Administered 2018-08-28: 20:00:00 via INTRAVENOUS

## 2018-08-28 MED ORDER — ATORVASTATIN CALCIUM 80 MG PO TABS
80.0000 mg | ORAL_TABLET | Freq: Every day | ORAL | Status: DC
  Administered 2018-08-28: 80 mg via ORAL
  Filled 2018-08-28: qty 1

## 2018-08-28 MED ORDER — ONDANSETRON HCL 4 MG/2ML IJ SOLN
4.0000 mg | Freq: Four times a day (QID) | INTRAMUSCULAR | Status: DC | PRN
  Administered 2018-08-28: 4 mg via INTRAVENOUS
  Filled 2018-08-28: qty 2

## 2018-08-28 MED ORDER — ASPIRIN EC 81 MG PO TBEC
81.0000 mg | DELAYED_RELEASE_TABLET | Freq: Every day | ORAL | Status: DC
  Administered 2018-08-29: 09:00:00 81 mg via ORAL
  Filled 2018-08-28: qty 1

## 2018-08-28 MED ORDER — ACETAMINOPHEN 325 MG PO TABS
650.0000 mg | ORAL_TABLET | ORAL | Status: DC | PRN
  Administered 2018-08-28: 650 mg via ORAL
  Filled 2018-08-28: qty 2

## 2018-08-28 MED ORDER — FENTANYL CITRATE (PF) 100 MCG/2ML IJ SOLN
INTRAMUSCULAR | Status: AC
  Filled 2018-08-28: qty 2

## 2018-08-28 MED ORDER — MIDAZOLAM HCL 2 MG/2ML IJ SOLN
INTRAMUSCULAR | Status: DC | PRN
  Administered 2018-08-28: 1 mg via INTRAVENOUS

## 2018-08-28 MED ORDER — SODIUM CHLORIDE 0.9% FLUSH: 3.0000 mL | INTRAVENOUS | Status: DC | PRN

## 2018-08-28 MED ORDER — IODIXANOL 320 MG/ML IV SOLN
INTRAVENOUS | Status: DC | PRN
  Administered 2018-08-28: 220 mL via INTRAVENOUS

## 2018-08-28 MED ORDER — FENTANYL CITRATE (PF) 100 MCG/2ML IJ SOLN
INTRAMUSCULAR | Status: DC | PRN
  Administered 2018-08-28: 25 ug via INTRAVENOUS

## 2018-08-28 MED ORDER — HYDRALAZINE HCL 20 MG/ML IJ SOLN: 5.0000 mg | INTRAMUSCULAR | Status: DC | PRN

## 2018-08-28 MED ORDER — SODIUM CHLORIDE 0.9 % WEIGHT BASED INFUSION: 1.0000 mL/kg/h | INTRAVENOUS | Status: DC

## 2018-08-28 MED ORDER — NICOTINE 14 MG/24HR TD PT24
14.0000 mg | MEDICATED_PATCH | Freq: Every day | TRANSDERMAL | Status: DC
  Filled 2018-08-28: qty 1

## 2018-08-28 MED ORDER — ASPIRIN 81 MG PO CHEW: 81.0000 mg | CHEWABLE_TABLET | ORAL | Status: DC

## 2018-08-28 MED ORDER — MORPHINE SULFATE (PF) 10 MG/ML IV SOLN: 2.0000 mg | INTRAVENOUS | Status: DC | PRN

## 2018-08-28 MED ORDER — SODIUM CHLORIDE 0.9 % WEIGHT BASED INFUSION
3.0000 mL/kg/h | INTRAVENOUS | Status: DC
  Administered 2018-08-28: 3 mL/kg/h via INTRAVENOUS

## 2018-08-28 MED ORDER — METOPROLOL SUCCINATE ER 25 MG PO TB24
25.0000 mg | ORAL_TABLET | Freq: Every day | ORAL | Status: DC
  Administered 2018-08-29: 25 mg via ORAL
  Filled 2018-08-28: qty 1

## 2018-08-28 MED ORDER — HEPARIN (PORCINE) IN NACL 1000-0.9 UT/500ML-% IV SOLN
INTRAVENOUS | Status: DC | PRN
  Administered 2018-08-28 (×2): 500 mL

## 2018-08-28 MED ORDER — HEPARIN (PORCINE) IN NACL 1000-0.9 UT/500ML-% IV SOLN
INTRAVENOUS | Status: AC
  Filled 2018-08-28: qty 1000

## 2018-08-28 MED ORDER — LIDOCAINE HCL (PF) 1 % IJ SOLN
INTRAMUSCULAR | Status: DC | PRN
  Administered 2018-08-28: 20 mL

## 2018-08-28 MED ORDER — TICAGRELOR 90 MG PO TABS: 90.0000 mg | ORAL_TABLET | Freq: Two times a day (BID) | ORAL | Status: DC

## 2018-08-28 MED ORDER — SODIUM CHLORIDE 0.9% FLUSH: 3.0000 mL | Freq: Two times a day (BID) | INTRAVENOUS | Status: DC

## 2018-08-28 MED ORDER — AMLODIPINE BESYLATE 5 MG PO TABS
5.0000 mg | ORAL_TABLET | Freq: Every day | ORAL | Status: DC
  Administered 2018-08-28: 23:00:00 5 mg via ORAL
  Filled 2018-08-28: qty 1

## 2018-08-28 MED ORDER — ASPIRIN 81 MG PO TBEC: 81.0000 mg | DELAYED_RELEASE_TABLET | Freq: Every day | ORAL | Status: DC

## 2018-08-28 MED ORDER — SODIUM CHLORIDE 0.9 % IV SOLN: 250.0000 mL | INTRAVENOUS | Status: DC | PRN

## 2018-08-28 MED ORDER — LISINOPRIL 10 MG PO TABS
10.0000 mg | ORAL_TABLET | Freq: Every day | ORAL | Status: DC
  Administered 2018-08-29: 09:00:00 10 mg via ORAL
  Filled 2018-08-28: qty 1

## 2018-08-28 MED ORDER — LIDOCAINE HCL (PF) 1 % IJ SOLN
INTRAMUSCULAR | Status: AC
  Filled 2018-08-28: qty 30

## 2018-08-28 MED ORDER — AMLODIPINE BESYLATE 5 MG PO TABS: 5.0000 mg | ORAL_TABLET | Freq: Every day | ORAL | Status: DC

## 2018-08-28 MED ORDER — LABETALOL HCL 5 MG/ML IV SOLN: 10.0000 mg | INTRAVENOUS | Status: DC | PRN

## 2018-08-28 SURGICAL SUPPLY — 27 items
BALLN COYOTE OTW 2.5X150X150 (BALLOONS) ×3
BALLN IN.PACT DCB 5X150 (BALLOONS) ×3
BALLOON COYOTE OTW 2.5X150X150 (BALLOONS) ×2 IMPLANT
CATH ANGIO 5F PIGTAIL 65CM (CATHETERS) ×3 IMPLANT
CATH CROSS OVER TEMPO 5F (CATHETERS) ×3 IMPLANT
CATH HAWKONE LX EXTENDED TIP (CATHETERS) ×3 IMPLANT
CATH VIANCE CROSS STAND 150CM (MICROCATHETER) ×3
CATH VIANCE CROSS STD 150CM (MICROCATHETER) ×2 IMPLANT
DCB IN.PACT 5X150 (BALLOONS) ×2 IMPLANT
DEVICE CONTINUOUS FLUSH (MISCELLANEOUS) ×3 IMPLANT
DEVICE SPIDERFX EMB PROT 6MM (WIRE) ×3 IMPLANT
GUIDEWIRE ANGLED .035X150CM (WIRE) ×3 IMPLANT
KIT ENCORE 26 ADVANTAGE (KITS) ×3 IMPLANT
KIT ESSENTIALS PG (KITS) ×3 IMPLANT
KIT PV (KITS) ×3 IMPLANT
SHEATH HIGHFLEX ANSEL 7FR 55CM (SHEATH) ×3 IMPLANT
SHEATH PINNACLE 5F 10CM (SHEATH) ×3 IMPLANT
SHEATH PINNACLE 7F 10CM (SHEATH) ×3 IMPLANT
SHEATH PROBE COVER 6X72 (BAG) ×3 IMPLANT
SYR MEDRAD MARK 7 150ML (SYRINGE) ×3 IMPLANT
TAPE VIPERTRACK RADIOPAQ (MISCELLANEOUS) ×2 IMPLANT
TAPE VIPERTRACK RADIOPAQUE (MISCELLANEOUS) ×1
TRANSDUCER W/STOPCOCK (MISCELLANEOUS) ×3 IMPLANT
TRAY PV CATH (CUSTOM PROCEDURE TRAY) ×3 IMPLANT
WIRE HITORQ VERSACORE ST 145CM (WIRE) ×3 IMPLANT
WIRE ROSEN-J .035X260CM (WIRE) ×3 IMPLANT
WIRE SPARTACORE .014X300CM (WIRE) ×3 IMPLANT

## 2018-08-28 NOTE — Progress Notes (Signed)
Paged and notified cardiology physician extender d/t patient's multiple c/o discomfort. Patient states "I can't do this. I can only lay on my stomach or my side, I can't be on my back." Also states, "I'm right handed, I can't keep my right leg still, I can't keep my head on the pillow, I'm used to moving. Y'all need to do something. Give me something to knock me out, I can't do this." Patient frequently complaining and not following instruction. Message accepted.

## 2018-08-28 NOTE — Interval H&P Note (Signed)
History and Physical Interval Note:  08/28/2018 9:33 AM  Cassandra Santana  has presented today for surgery, with the diagnosis of pad  The various methods of treatment have been discussed with the patient and family. After consideration of risks, benefits and other options for treatment, the patient has consented to  Procedure(s): ABDOMINAL AORTOGRAM W/LOWER EXTREMITY (N/A) as a surgical intervention .  The patient's history has been reviewed, patient examined, no change in status, stable for surgery.  I have reviewed the patient's chart and labs.  Questions were answered to the patient's satisfaction.     Quay Burow

## 2018-08-28 NOTE — Progress Notes (Signed)
Received Cassandra Santana from cath lab into [phone] s/p left SFA balloon angioplasty. Patient appears stable, right groin with 7 fr sheath intact. Normal saline IVF running. Assessment in progress.

## 2018-08-28 NOTE — Progress Notes (Addendum)
Site area: right groin  Site Prior to Removal:  Level 0  Pressure Applied For 20 MINUTES    Minutes Beginning at 1700   Manual:   Yes.    Patient Status During Pull:   WNL  Post Pull Groin Site:  Level 0  Post Pull Instructions Given:  Yes.    Post Pull Pulses Present:  Yes.    Dressing Applied:  Yes.    Comments:  Tolerated procedure well

## 2018-08-28 NOTE — Discharge Summary (Signed)
Discharge Summary    Patient ID: Cassandra Santana MRN: [id number]; DOB: 04/30/1951  Admit date: 08/28/2018 Discharge date: 08/29/2018  Primary Care Provider: Deland Pretty, MD  Primary Cardiologist: Quay Burow, MD   Discharge Diagnoses    Principal Problem:   Peripheral arterial disease [facility]) Active Problems:   NSTEMI (non-ST elevated myocardial infarction) ([place])   Hypertension   Hyperlipidemia   Tobacco use   Claudication in peripheral vascular disease ([place])  Allergies Allergies  Allergen Reactions  . Keflex [Cephalexin] Hives  . Tomato Hives and Itching   Diagnostic Studies/Procedures    Abdominal aortogram with lower extremity with intervention 08/28/2018: Angiographic Data:   1: Abdominal aorta- the distal abdominal aorta was mildly dilated but did not appear aneurysmal or significantly atherosclerotic 2: Left lower extremity- moderately long segment mid left SFA CTO with reconstitution in the adductor canal by profunda femoris collaterals.  There was three-vessel runoff 3: Right lower extremity- mid right SFA CTO with reconstitution in the adductor canal by profunda femoris collaterals.  There was three-vessel runoff  IMPRESSION: Cassandra Santana has bilateral SFA CTO's with claudication.  We will proceed with left SFA directional atherectomy followed by drug-coated balloon angioplasty using spider distal protection  Procedure Description: Contralateral access was obtained with a crossover catheter, 035 Glidewire, 035 Rosen wire and a 7 [place] 55 cm multipurpose Ansell sheath.  Patient received a total of 14 testings of heparin with an ending ACT of 301.  Total contrast administered to the patient was 220 cc.  I was able to cross the CTO with a Viance  CTO catheter along with a 1 four 300 cm Sparta core wire.  Following this I performed balloon angioplasty using a 2.5 mm x 150 mm long coyote balloon.  I then placed a 6 mm spider distal protection device over the Sparta  core wire into the left below the knee popliteal artery.  Following this I performed directional atherectomy using a Hawk 1 device performing multiple circumferential cuts and extracting a copious amount of atherosclerotic plaque.  Following this I performed drug-coated balloon angioplasty using a 5 mm x 150 mm long impact Admiral DCB at 6 atm for 20 minutes resulting in reduction of a total occlusion to 0% residual without dissection.  There was excellent flow.  The spider distal protection device was then recaptured and completion angiography performed.  Final Impression: Successful Hawk 1 directional atherectomy followed by drug-coated balloon angioplasty of the left SFA CTO using spider distal protection in the setting of lifestyle limiting claudication.  The patient is already on dual antiplatelet therapy including aspirin and Brilinta.  She has similar anatomy on the right which can be addressed in a staged fashion.  The Ansell sheath was removed over the 035 versa core wire and exchanged for a short 7 [place] sheath which was then secured in place.  The patient left the lab in stable condition.  The sheath will be removed once the ACT falls below 170 and pressure held.  She will be gently hydrated overnight and discharged home in the morning.  We will obtain lower extremity arterial Doppler studies of her left leg next week in our [facility] line office and I will see her back the week after to assess clinical efficacy.  At that time we will discuss staged right SFA intervention.   History of Present Illness     Cassandra Santana is a 68 y.o. with a prior hx of NSTEMI (2019), HTN, rectal polyps and colitis. She was last  seen by Dr. Gwenlyn Found in the office 07/19/2018 for post hospital follow up after a NSTEMI in 04/05/2018. At that time, she underwent a cardiac cath by Dr. Burt Knack revealing a high-grade mid AV groove circumflex which underwent PCI and drug-eluting stenting using a synergy drug-eluting stent. She had  mild disease in approximately the moderate disease of the PDA. He EF was noted to be normal.She was placed on DAPT including aspirin and Brilinta as well as high-dose statin therapy. She had some mild complaints of myalgias, thought to be related to statin initiation.   Because of these complaints of bilateral lower extremity pain she had Doppler studies performed 08/04/2018 revealing a right ABI of 0.71 and a left ABI 0.64.  She had an occluded distal right SFA and an occluded left popliteal artery. Her symptoms are lifestyle limiting and plans were made to pursue angiography potential endovascular therapy.   Hospital Course     She underwent an abdominal aortogram with LE intervention secondary to bilateral SFA CTO's with claudication. She had a left SFA directional atherectomy followed by drug-coated balloon angioplasty using spider distal protection with similar anatomy on the right which can be address in a staged fashion as noted per procedure report.   Plan is for lower extremity arterial Doppler studies of her left leg at the [facility] office and office visit the week after to assess clinical efficacy.  At that time we will discuss staged right SFA intervention.  General: Well developed, well nourished, NAD Skin: Warm, dry, intact  Head: Normocephalic, atraumatic, clear, moist mucus membranes. Neck: Negative for carotid bruits. No JVD Lungs:Clear to ausculation bilaterally. No wheezes, rales, or rhonchi. Breathing is unlabored. Cardiovascular: RRR with S1 S2. No murmurs, rubs, gallops, or LV heave appreciated. Abdomen: Soft, non-tender, non-distended with normoactive bowel sounds. No obvious abdominal masses. MSK: Strength and tone appear normal for age. 5/5 in all extremities Extremities: No edema. No clubbing or cyanosis. DP/PT pulses 2+ bilaterally Neuro: Alert and oriented. No focal deficits. No facial asymmetry. MAE spontaneously. Psych: Responds to questions appropriately with normal  affect.    Consultants: None   The patient was seen and examined by Dr. Ellyn Hack who feels that she is stable and ready for discharge today, 08/29/2018. Procedure sites are unremarkable and the patient has ambulated without complication.  Discharge Vitals Blood pressure (!) 112/54, pulse 63, temperature 98.8 F (37.1 C), temperature source Oral, resp. rate 19, height _0  (1.6 m), weight 66 kg, SpO2 98 %.  Filed Weights   08/28/18 0726 08/29/18 0700  Weight: 65.3 kg 66 kg   Labs & Radiologic Studies    Vas [place] Le Art Seg Multi (segm&le Reynauds)  Result Date: 08/04/2018 LOWER EXTREMITY DOPPLER STUDY Indications: Claudication. Patient reports intermittent pain in her legs;              sometimes they feel like a dull ache all over, but she mostly              notices her legs feel funny after taking certain medication. High Risk         Hypertension, hyperlipidemia, past history of smoking, prior Factors:          MI.  Performing Technologist: Chesley Noon RVT  Examination Guidelines: A complete evaluation includes at minimum, Doppler waveform signals and systolic blood pressure reading at the level of bilateral brachial, anterior tibial, and posterior tibial arteries, when vessel segments are accessible. Bilateral testing is considered an integral part of a complete  examination. Photoelectric Plethysmograph (PPG) waveforms and toe systolic pressure readings are included as required and additional duplex testing as needed. Limited examinations for reoccurring indications may be performed as noted.  ABI Findings: +---------+------------------+-----+----------+--------+ Right    Rt Pressure (mmHg)IndexWaveform  Comment  +---------+------------------+-----+----------+--------+ Brachial 160                                       +---------+------------------+-----+----------+--------+ CFA                             biphasic            +---------+------------------+-----+----------+--------+ Popliteal                       monophasic         +---------+------------------+-----+----------+--------+ ATA      101               0.63 monophasic         +---------+------------------+-----+----------+--------+ PTA      114               0.71 monophasic         +---------+------------------+-----+----------+--------+ PERO     93                0.58 monophasic         +---------+------------------+-----+----------+--------+ Great Toe68                0.42 Abnormal           +---------+------------------+-----+----------+--------+ +---------+------------------+-----+----------+-------+ Left     Lt Pressure (mmHg)IndexWaveform  Comment +---------+------------------+-----+----------+-------+ Brachial 152                                      +---------+------------------+-----+----------+-------+ CFA                             biphasic          +---------+------------------+-----+----------+-------+ Popliteal                       monophasic        +---------+------------------+-----+----------+-------+ ATA      98                0.61 monophasic        +---------+------------------+-----+----------+-------+ PTA      103               0.64 monophasic        +---------+------------------+-----+----------+-------+ PERO     99                0.62 monophasic        +---------+------------------+-----+----------+-------+ Great Toe46                0.29 Abnormal          +---------+------------------+-----+----------+-------+ +-------+-----------+-----------+------------+------------+ ABI/TBIToday's ABIToday's TBIPrevious ABIPrevious TBI +-------+-----------+-----------+------------+------------+ Right  0.71       0.42                                +-------+-----------+-----------+------------+------------+ Left   0.64       0.29                                 +-------+-----------+-----------+------------+------------+  Summary: Right: Resting right ankle-brachial index indicates moderate right lower extremity arterial disease. The right toe-brachial index is abnormal. Left: Resting left ankle-brachial index indicates moderate left lower extremity arterial disease. The left toe-brachial index is abnormal.  *See table(s) above for measurements and observations. Please see arterial duplex report.  Vascular consult recommended. Electronically signed by Ida Rogue MD on 08/04/2018 at 33:16:42 PM.    Final    Vas US Carotid  Result Date: 08/04/2018 Carotid Arterial Duplex Study Indications:  Bruit. Risk Factors: Hypertension, hyperlipidemia, past history of smoking, prior MI,               PAD. Performing Technologist: Chesley Noon RVT  Examination Guidelines: A complete evaluation includes B-mode imaging, spectral Doppler, color Doppler, and power Doppler as needed of all accessible portions of each vessel. Bilateral testing is considered an integral part of a complete examination. Limited examinations for reoccurring indications may be performed as noted.  Right Carotid Findings: +----------+--------+--------+--------+--------+------------------+           PSV cm/sEDV cm/sStenosisDescribeComments           +----------+--------+--------+--------+--------+------------------+ CCA Prox  147     16                                         +----------+--------+--------+--------+--------+------------------+ CCA Distal67      15                      intimal thickening +----------+--------+--------+--------+--------+------------------+ ICA Prox  47      9                       intimal thickening +----------+--------+--------+--------+--------+------------------+ ICA Mid   69      18      Normal                             +----------+--------+--------+--------+--------+------------------+ ICA Distal64      20                                          +----------+--------+--------+--------+--------+------------------+ ECA       120     13                                         +----------+--------+--------+--------+--------+------------------+ +----------+--------+-------+----------------+-------------------+           PSV cm/sEDV cmsDescribe        Arm Pressure (mmHG) +----------+--------+-------+----------------+-------------------+ [id number]             Multiphasic, [id number]                 +----------+--------+-------+----------------+-------------------+ +---------+--------+--+--------+--+---------+ VertebralPSV cm/s49EDV cm/s10Antegrade +---------+--------+--+--------+--+---------+  Left Carotid Findings: +----------+--------+--------+--------+--------+------------------+           PSV cm/sEDV cm/sStenosisDescribeComments           +----------+--------+--------+--------+--------+------------------+ CCA Prox  74      18                                         +----------+--------+--------+--------+--------+------------------+  CCA Distal56      16                      intimal thickening +----------+--------+--------+--------+--------+------------------+ ICA Prox  53      14                                         +----------+--------+--------+--------+--------+------------------+ ICA Mid   74      22      Normal                             +----------+--------+--------+--------+--------+------------------+ ICA Distal89      29                                         +----------+--------+--------+--------+--------+------------------+ ECA       63      8                                          +----------+--------+--------+--------+--------+------------------+ +----------+--------+--------+----------------+-------------------+ SubclavianPSV cm/sEDV cm/sDescribe        Arm Pressure (mmHG) +----------+--------+--------+----------------+-------------------+            212             Multiphasic, [id number]                 +----------+--------+--------+----------------+-------------------+ +---------+--------+--+--------+-+---------+ VertebralPSV cm/s40EDV cm/s5Antegrade +---------+--------+--+--------+-+---------+  Summary: Right Carotid: There is no evidence of stenosis in the right ICA.                The extracranial vessels were near-normal with only minimal wall                thickening or plaque. Left Carotid: There is no evidence of stenosis in the left ICA.               The extracranial vessels were near-normal with only minimal wall               thickening or plaque. Vertebrals:  Bilateral vertebral arteries demonstrate antegrade flow. Subclavians: Normal flow hemodynamics were seen in bilateral subclavian              arteries. *See table(s) above for measurements and observations.  Electronically signed by Ida Rogue MD on 08/04/2018 at 8:19:38 PM.    Final    Vas [place] Lower Extremity Arterial Duplex  Result Date: 08/04/2018 LOWER EXTREMITY ARTERIAL DUPLEX STUDY Indications: Claudication, and Patient reports intermittent pain in her legs;              sometimes they feel like a dull ache all over, but she mostly              notices her legs feel funny after taking certain medication. High Risk Factors: Hypertension, hyperlipidemia, past history of smoking, prior                    MI.  Current ABI: Today's ABIs were 0.71 on the right and 0.64 on the left. Performing Technologist: Chesley Noon RVT  Examination Guidelines: A complete evaluation includes B-mode imaging, spectral Doppler, color Doppler, and power Doppler  as needed of all accessible portions of each vessel. Bilateral testing is considered an integral part of a complete examination. Limited examinations for reoccurring indications may be performed as noted.  Right Duplex Findings: +----------+--------+-----+---------------+----------+----------------+           PSV cm/sRatioStenosis        Waveform  Comments         +----------+--------+-----+---------------+----------+----------------+ CFA Prox  94                          biphasic                   +----------+--------+-----+---------------+----------+----------------+ DFA       87                          biphasic                   +----------+--------+-----+---------------+----------+----------------+ SFA Prox  159          30-49% stenosisbiphasic  plaque           +----------+--------+-----+---------------+----------+----------------+ SFA Mid   72                          biphasic  plaque           +----------+--------+-----+---------------+----------+----------------+ SFA Distal0            occluded                 plaque           +----------+--------+-----+---------------+----------+----------------+ POP Prox  15                          monophasicreconstitutes AK +----------+--------+-----+---------------+----------+----------------+ POP Mid   22                          monophasic                 +----------+--------+-----+---------------+----------+----------------+ POP Distal25                                                     +----------+--------+-----+---------------+----------+----------------+ ATA Mid   15                          monophasic                 +----------+--------+-----+---------------+----------+----------------+ PTA Mid   20                          monophasic                 +----------+--------+-----+---------------+----------+----------------+ PERO Mid  17                          monophasic                 +----------+--------+-----+---------------+----------+----------------+  Left Duplex Findings: +----------+--------+-----+---------------+----------+-------------------------+           PSV cm/sRatioStenosis       Waveform  Comments                   +----------+--------+-----+---------------+----------+-------------------------+  CFA Prox  94                          biphasic  plaque                    +----------+--------+-----+---------------+----------+-------------------------+ CFA Distal                                      plaque                    +----------+--------+-----+---------------+----------+-------------------------+ DFA       142                         biphasic                            +----------+--------+-----+---------------+----------+-------------------------+ SFA Prox  58                          biphasic  plaque                    +----------+--------+-----+---------------+----------+-------------------------+ SFA Mid   22                          biphasic  plaque                    +----------+--------+-----+---------------+----------+-------------------------+ SFA Distal182          50-74% stenosis          heavy plaque/narrowing    +----------+--------+-----+---------------+----------+-------------------------+ POP Prox  0            occluded                 suspect short-segment                                                     occlusion in the distal                                                   SFA/AK pop area           +----------+--------+-----+---------------+----------+-------------------------+ POP Distal21                          monophasicplaque                    +----------+--------+-----+---------------+----------+-------------------------+ ATA Distal16                          monophasic                          +----------+--------+-----+---------------+----------+-------------------------+ PTA Mid   14                          monophasic                          +----------+--------+-----+---------------+----------+-------------------------+  PERO Mid  16                          monophasic                           +----------+--------+-----+---------------+----------+-------------------------+ A focal velocity elevation of 182 cm/s was obtained at distal SFA with post stenotic turbulence with a VR of 4.3. Findings are characteristic of 50-74% stenosis based on VR and plaque burden. The distal left SFA/above-knee popliteal area demonstrates a likely segmental occlusion based on absence of Doppler flow and dampened distal waveforms.  Summary: Right: Total occlusion noted in the distal superficial femoral artery. Reconstituted flow noted in the above-knee popliteal. Left: 50-74% stenosis noted in the superficial femoral artery based on VR and plaque burden. Suspected short-segment occlusion noted in the distal superficial femoral artery and/or above-knee popliteal artery with distal reconstitution.  See table(s) above for measurements and observations. See ABI report. Vascular consult recommended. Electronically signed by Ida Rogue MD on 08/04/2018 at 8:17:29 PM.    Final    Disposition   Pt is being discharged home today in good condition.  Follow-up Plans & Appointments   Follow-up Information    [facility] Follow up on 09/15/2018.   Specialty:  Cardiology Why:  Your follow-up Doppler studies will be on 09/15/2018 at 9 AM Contact information: [address] [place]       [name], MD Follow up on 09/19/2018.   Specialties:  Cardiology, Radiology Why:  Your follow-up appoint with Dr. Gwenlyn Found will be on 09/19/2018 at 2:15 PM Contact information: [address] [place] [address] [phone]          Discharge Instructions    Call MD for:  difficulty breathing, headache or visual disturbances   Complete by:  As directed    Call MD for:  extreme fatigue   Complete by:  As directed    Call MD for:  hives   Complete by:  As directed    Call MD for:  persistant dizziness or light-headedness   Complete by:  As  directed    Call MD for:  persistant nausea and vomiting   Complete by:  As directed    Call MD for:  redness, tenderness, or signs of infection (pain, swelling, redness, odor or green/yellow discharge around incision site)   Complete by:  As directed    Call MD for:  severe uncontrolled pain   Complete by:  As directed    Call MD for:  temperature >100.4   Complete by:  As directed    Diet - low sodium heart healthy   Complete by:  As directed    Discharge instructions   Complete by:  As directed    No driving for 3 days. No lifting over 5 lbs for 1 week. No sexual activity for 1 week. Keep procedure site clean & dry. If you notice increased pain, swelling, bleeding or pus, call/return!  You may shower, but no soaking baths/hot tubs/pools for 1 week.   If you notice any bleeding such as blood in stool, black tarry stools, blood in urine, nosebleeds or any other unusual bleeding, call your doctor immediately. It is not normal to have this kind of bleeding while on a blood thinner and usually indicates there is an underlying problem with one of your body systems that needs  to be checked out.   Increase activity slowly   Complete by:  As directed      Discharge Medications   Allergies as of 08/29/2018      Reactions   Keflex [cephalexin] Hives   Tomato Hives, Itching      Medication List    TAKE these medications   acetaminophen 500 MG tablet Commonly known as:  TYLENOL Take 1,000 mg by mouth daily as needed for moderate pain or headache.   amLODipine 5 MG tablet Commonly known as:  NORVASC Take 1 tablet (5 mg total) by mouth daily. What changed:  when to take this   aspirin 81 MG EC tablet Take 1 tablet (81 mg total) by mouth daily.   atorvastatin 80 MG tablet Commonly known as:  LIPITOR Take 1 tablet (80 mg total) by mouth daily at 6 PM.   BIOFREEZE EX Apply 1 application topically daily as needed (muscle pain).   lisinopril 10 MG tablet Commonly known as:   PRINIVIL,ZESTRIL Take 1 tablet (10 mg total) by mouth daily.   Melatonin 5 MG Caps Take 10 mg by mouth at bedtime as needed (sleep).   metoprolol succinate 25 MG 24 hr tablet Commonly known as:  TOPROL-XL Take 1 tablet (25 mg total) by mouth daily. Take with or immediately following a meal.   nicotine 14 mg/24hr patch Commonly known as:  NICODERM CQ - dosed in mg/24 hours Place 1 patch (14 mg total) onto the skin daily. What changed:    when to take this  reasons to take this   nitroGLYCERIN 0.4 MG SL tablet Commonly known as:  NITROSTAT Place 1 tablet (0.4 mg total) under the tongue every 5 (five) minutes x 3 doses as needed for chest pain.   ticagrelor 90 MG Tabs tablet Commonly known as:  BRILINTA Take 1 tablet (90 mg total) by mouth 2 (two) times daily.        Acute coronary syndrome (MI, NSTEMI, STEMI, etc) this admission?: No.    Outstanding Labs/Studies   None   Duration of Discharge Encounter   Greater than 30 minutes including physician time.  Signed, Kathyrn Drown, NP 08/29/2018, 9:51 AM

## 2018-08-29 ENCOUNTER — Other Ambulatory Visit: Payer: Self-pay

## 2018-08-29 ENCOUNTER — Encounter (HOSPITAL_COMMUNITY): Payer: Self-pay | Admitting: General Practice

## 2018-08-29 DIAGNOSIS — E663 Overweight: Secondary | ICD-10-CM | POA: Diagnosis not present

## 2018-08-29 DIAGNOSIS — I70223 Atherosclerosis of native arteries of extremities with rest pain, bilateral legs: Secondary | ICD-10-CM | POA: Diagnosis not present

## 2018-08-29 DIAGNOSIS — E785 Hyperlipidemia, unspecified: Secondary | ICD-10-CM | POA: Diagnosis not present

## 2018-08-29 DIAGNOSIS — I739 Peripheral vascular disease, unspecified: Secondary | ICD-10-CM

## 2018-08-29 DIAGNOSIS — Z87891 Personal history of nicotine dependence: Secondary | ICD-10-CM | POA: Diagnosis not present

## 2018-08-29 DIAGNOSIS — Z79899 Other long term (current) drug therapy: Secondary | ICD-10-CM | POA: Diagnosis not present

## 2018-08-29 DIAGNOSIS — I214 Non-ST elevation (NSTEMI) myocardial infarction: Secondary | ICD-10-CM | POA: Diagnosis not present

## 2018-08-29 DIAGNOSIS — I10 Essential (primary) hypertension: Secondary | ICD-10-CM | POA: Diagnosis not present

## 2018-08-29 DIAGNOSIS — Z7982 Long term (current) use of aspirin: Secondary | ICD-10-CM | POA: Diagnosis not present

## 2018-08-29 DIAGNOSIS — Z881 Allergy status to other antibiotic agents status: Secondary | ICD-10-CM | POA: Diagnosis not present

## 2018-08-29 LAB — CBC
HCT: 28.1 % — ABNORMAL LOW (ref 36.0–46.0)
Hemoglobin: 8.8 g/dL — ABNORMAL LOW (ref 12.0–15.0)
MCH: 26 pg (ref 26.0–34.0)
MCHC: 31.3 g/dL (ref 30.0–36.0)
MCV: 83.1 fL (ref 80.0–100.0)
Platelets: 332 10*3/uL (ref 150–400)
RBC: 3.38 MIL/uL — ABNORMAL LOW (ref 3.87–5.11)
RDW: 15.7 % — ABNORMAL HIGH (ref 11.5–15.5)
WBC: 9.1 10*3/uL (ref 4.0–10.5)
nRBC: 0 % (ref 0.0–0.2)

## 2018-08-29 LAB — BASIC METABOLIC PANEL
Anion gap: 8 (ref 5–15)
BUN: 7 mg/dL — ABNORMAL LOW (ref 8–23)
CO2: 21 mmol/L — ABNORMAL LOW (ref 22–32)
Calcium: 8.6 mg/dL — ABNORMAL LOW (ref 8.9–10.3)
Chloride: 111 mmol/L (ref 98–111)
Creatinine, Ser: 0.93 mg/dL (ref 0.44–1.00)
GFR calc Af Amer: >60 mL/min (ref >60)
GFR calc non Af Amer: >60 mL/min (ref >60)
Glucose, Bld: 96 mg/dL (ref 70–99)
Potassium: 3.2 mmol/L — ABNORMAL LOW (ref 3.5–5.1)
Sodium: 140 mmol/L (ref 135–145)

## 2018-08-29 MED ORDER — POTASSIUM CHLORIDE CRYS ER 20 MEQ PO TBCR: 40.0000 meq | EXTENDED_RELEASE_TABLET | Freq: Once | ORAL | Status: DC

## 2018-09-01 ENCOUNTER — Telehealth: Payer: Self-pay | Admitting: Cardiovascular Disease

## 2018-09-01 ENCOUNTER — Encounter: Payer: Self-pay | Admitting: Physician Assistant

## 2018-09-01 ENCOUNTER — Ambulatory Visit (INDEPENDENT_AMBULATORY_CARE_PROVIDER_SITE_OTHER): Payer: Medicare HMO | Admitting: Physician Assistant

## 2018-09-01 VITALS — BP 142/74 | HR 74 | Ht 63.0 in | Wt 148.0 lb

## 2018-09-01 DIAGNOSIS — I10 Essential (primary) hypertension: Secondary | ICD-10-CM | POA: Diagnosis not present

## 2018-09-01 DIAGNOSIS — Z720 Tobacco use: Secondary | ICD-10-CM

## 2018-09-01 DIAGNOSIS — I739 Peripheral vascular disease, unspecified: Secondary | ICD-10-CM | POA: Diagnosis not present

## 2018-09-01 DIAGNOSIS — I2510 Atherosclerotic heart disease of native coronary artery without angina pectoris: Secondary | ICD-10-CM

## 2018-09-01 NOTE — Progress Notes (Signed)
Cardiology Office Note   Date:  09/01/2018   ID:  Cassandra Santana, Cassandra Santana Dec 08, 1950, MRN [id number]  PCP:  Deland Pretty, MD Cardiologist:  Quay Burow, MD 08/28/2018 in hospital Rosaria Ferries, PA-C   No chief complaint on file.   History of Present Illness: Cassandra Santana is a 68 y.o. female with a history of NSTEMI (04/2018) s/p DES CFX w/ nl EF, HTN, rectal polyps and colitis.   Admitted 1/27-1/28/2024 PAD, s/p left SFA directional atherectomy and drug-coated balloon angioplasty.  Cassandra Santana presents for cardiology follow up.  She has not started back smoking, hopes to stay off tobacco.  She went home on 12/28, noticed an odor on 12/29. The odor has continued. Nothing on the bandaid, no drainage. No fevers or chills.   BP higher than usual for her. Was 117/55 when she last checked it at home.   No chest pain, no SOB. Not limited by breathing at work or at home.   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  Feels the stress in her groin when she gets up and down.  Wants to get stronger and get to walking better before she has to go back to work.  When she is at work, she has to lift patients and push stretchers, sometimes the exertional level is high.   Past Medical History:  Diagnosis Date  . Colitis   . Hypertension   . Internal hemorrhoids   . NSTEMI (non-ST elevated myocardial infarction) ([place]) 04/2018  . PAD (peripheral artery disease) ([place])   . Rectal polyp    hyperplastic    Past Surgical History:  Procedure Laterality Date  . ABDOMINAL AORTOGRAM W/LOWER EXTREMITY Bilateral 08/28/2018   Procedure: ABDOMINAL AORTOGRAM W/LOWER EXTREMITY;  Surgeon: [name], MD;  Location: [facility];  Service: Cardiovascular;  Laterality: Bilateral;  . ABDOMINAL HYSTERECTOMY  1993  . CORONARY STENT INTERVENTION N/A 04/06/2018   Procedure: CORONARY STENT INTERVENTION;  Surgeon: Sherren Mocha, MD;  Location: [facility];  Service:  Cardiovascular;  Laterality: N/A;  . LEFT HEART CATH AND CORONARY ANGIOGRAPHY N/A 04/06/2018   Procedure: LEFT HEART CATH AND CORONARY ANGIOGRAPHY;  Surgeon: Sherren Mocha, MD;  Location: [facility];  Service: Cardiovascular;  Laterality: N/A;  . PERIPHERAL VASCULAR ATHERECTOMY Left 08/28/2018   Procedure: PERIPHERAL VASCULAR ATHERECTOMY;  Surgeon: [name], MD;  Location: [facility];  Service: Cardiovascular;  Laterality: Left;  SFA  . ROTATOR CUFF REPAIR Right     Current Outpatient Medications  Medication Sig Dispense Refill  . acetaminophen (TYLENOL) 500 MG tablet Take 1,000 mg by mouth daily as needed for moderate pain or headache.    Marland Kitchen amLODipine (NORVASC) 5 MG tablet Take 1 tablet (5 mg total) by mouth daily. (Patient taking differently: Take 5 mg by mouth at bedtime. ) 90 tablet 3  . aspirin EC 81 MG EC tablet Take 1 tablet (81 mg total) by mouth daily.    Marland Kitchen atorvastatin (LIPITOR) 80 MG tablet Take 1 tablet (80 mg total) by mouth daily at 6 PM. 30 tablet 11  . lisinopril (PRINIVIL,ZESTRIL) 10 MG tablet Take 1 tablet (10 mg total) by mouth daily. 30 tablet 11  . Melatonin 5 MG CAPS Take 10 mg by mouth at bedtime as needed (sleep).    . metoprolol succinate (TOPROL-XL) 25 MG 24 hr tablet Take 1 tablet (25 mg total) by mouth daily. Take with or immediately following a meal. 90  tablet 3  . nicotine (NICODERM CQ - DOSED IN MG/24 HOURS) 14 mg/24hr patch Place 1 patch (14 mg total) onto the skin daily. (Patient taking differently: Place 14 mg onto the skin daily as needed (smoking cessation). ) 28 patch 0  . nitroGLYCERIN (NITROSTAT) 0.4 MG SL tablet Place 1 tablet (0.4 mg total) under the tongue every 5 (five) minutes x 3 doses as needed for chest pain. 25 tablet 1  . ticagrelor (BRILINTA) 90 MG TABS tablet Take 1 tablet (90 mg total) by mouth 2 (two) times daily. 60 tablet 3   No current facility-administered medications for this visit.     Allergies:   Keflex  [cephalexin] and Tomato    Social History:  The patient  reports that she quit smoking about 4 months ago. Her smoking use included cigarettes. She smoked 0.50 packs per day. She has never used smokeless tobacco. She reports current drug use. Drug: Marijuana. She reports that she does not drink alcohol.   Family History:  The patient's family history includes Heart attack in her father; Kidney failure in her mother.  She indicated that her mother is deceased. She indicated that her father is deceased.   ROS:  Please see the history of present illness. All other systems are reviewed and negative.    PHYSICAL EXAM: VS:  BP (!) 142/74   Pulse 74   Ht _0  (1.6 m)   Wt 148 lb (67.1 kg)   SpO2 100%   BMI 26.22 kg/m  , BMI Body mass index is 26.22 kg/m. GEN: Well nourished, well developed, female in no acute distress HEENT: normal for age  Neck: no JVD, no carotid bruit, no masses Cardiac: RRR; no murmur, no rubs, or gallops Respiratory:  clear to auscultation bilaterally, normal work of breathing GI: soft, nontender, nondistended, + BS MS: no deformity or atrophy; no edema; distal pulses are 2+ in upper extremities; barely palpable right lower extremity DP, 1+ left lower extremity PT; right groin wound still has a small open area.  It is generally healing well, no signs or symptoms of infection.  There is no drainage noted.  No foul odor is noted. Skin: warm and dry, no rash Neuro:  Strength and sensation are intact Psych: euthymic mood, full affect   EKG:  EKG is not ordered today.  PV CATH: 08/28/2018 Abdominal aortogram with lower extremity with intervention 08/28/2018: Angiographic Data:   1: Abdominal aorta- the distal abdominal aorta was mildly dilated but did not appear aneurysmal or significantly atherosclerotic 2: Left lower extremity- moderately long segment mid left SFA CTO with reconstitution in the adductor canal by profunda femoris collaterals. There was  three-vessel runoff 3: Right lower extremity- mid right SFA CTO with reconstitution in the adductor canal by profunda femoris collaterals. There was three-vessel runoff  IMPRESSION:Cassandra Santana has bilateral SFA CTO's with claudication. We will proceed with left SFA directional atherectomy followed by drug-coated balloon angioplasty using spider distal protection  Procedure Description: Contralateral access was obtained with a crossover catheter, 035 Glidewire, 035 Rosen wire and a 7 [place] 55 cm multipurpose Ansell sheath. Patient received a total of 14 testings of heparin with an ending ACT of 301. Total contrast administered to the patient was 220 cc. I was able to cross the CTO with a Viance CTO catheter along with a 1 four 300 cm Sparta core wire. Following this I performed balloon angioplasty using a 2.5 mm x 150 mm long coyote balloon. I then placed a 6  mm spider distal protection device over the [place]a core wire into the left below the knee popliteal artery. Following this I performed directional atherectomy using a Hawk 1 device performing multiple circumferential cuts and extracting a copious amount of atherosclerotic plaque. Following this I performed drug-coated balloon angioplasty using a 5 mm x 150 mm long impact Admiral DCB at 6 atm for 20 minutes resulting in reduction of a total occlusion to 0% residual without dissection. There was excellent flow. The spider distal protection device was then recaptured and completion angiography performed.  Final Impression:Successful Hawk 1 directional atherectomy followed by drug-coated balloon angioplasty of the left SFA CTO using spider distal protection in the setting of lifestyle limiting claudication. The patient is already on dual antiplatelet therapy including aspirin and Brilinta. She has similar anatomy on the right which can be addressed in a staged fashion. The Ansell sheath was removed over the 035 versa core wire and  exchanged for a short 7 [place] sheath which was then secured in place. The patient left the lab in stable condition. The sheath will be removed once the ACT falls below 170 and pressure held. She will be gently hydrated overnight and discharged home in the morning. We will obtain lower extremity arterial Doppler studies of her left leg next week in our [facility] line office and I will see her back the week after to assess clinical efficacy. At that time we will discuss staged right SFA intervention.  CARDIAC CATH: 04/06/2018  Prox Cx to Mid Cx lesion is 95% stenosed.  Prox LAD lesion is 25% stenosed.  Mid RCA lesion is 40% stenosed.  Ost RPDA to RPDA lesion is 70% stenosed.  A drug-eluting stent was successfully placed using a STENT SYNERGY DES 3.5X16.  Post intervention, there is a 0% residual stenosis.   1.  Critical stenosis of the mid circumflex treated successfully with PCI using a 3.5 x 16 mm Synergy DES 2.  Mild to moderate stenosis of the RCA with diffuse plaquing in the RCA proper and moderate stenosis of the proximal PDA 3.  Widely patent left main and LAD with minor nonobstructive stenosis 4.  Normal LV systolic function by echo 5.  Normal LVEDP  Recommend uninterrupted dual antiplatelet therapy with Aspirin 70m daily and Ticagrelor 931mtwice daily for a minimum of 12 months (ACS - Class I recommendation).   Medical therapy for residual CAD Intervention       Recent Labs: 04/05/2018: ALT 9 08/17/2018: TSH 1.320 08/29/2018: BUN 7; Creatinine, Ser 0.93; Hemoglobin 8.8; Platelets 332; Potassium 3.2; Sodium 140  CBC    Component Value Date/Time   WBC 9.1 08/29/2018 0451   RBC 3.38 (L) 08/29/2018 0451   HGB 8.8 (L) 08/29/2018 0451   HGB 10.4 (L) 08/17/2018 1259   HCT 28.1 (L) 08/29/2018 0451   HCT 32.5 (L) 08/17/2018 1259   PLT 332 08/29/2018 0451   PLT 458 (H) 08/17/2018 1259   MCV 83.1 08/29/2018 0451   MCV 83 08/17/2018 1259   MCH 26.0 08/29/2018 0451   MCHC  31.3 08/29/2018 0451   RDW 15.7 (H) 08/29/2018 0451   RDW 14.3 08/17/2018 1259   LYMPHSABS 2.7 01/22/2013 1700   MONOABS 0.8 01/22/2013 1700   EOSABS 0.1 01/22/2013 1700   BASOSABS 0.0 01/22/2013 1700   CMP Latest Ref Rng & Units 08/29/2018 08/17/2018 04/07/2018  Glucose 70 - 99 mg/dL 96 96 91  BUN 8 - 23 mg/dL 7(L) 6(L) 8  Creatinine 0.44 - 1.00 mg/dL 0.93  0.82 0.95  Sodium 135 - 145 mmol/L 140 142 139  Potassium 3.5 - 5.1 mmol/L 3.2(L) 3.8 3.2(L)  Chloride 98 - 111 mmol/L 111 106 109  CO2 22 - 32 mmol/L 21(L) 23 23  Calcium 8.9 - 10.3 mg/dL 8.6(L) 9.4 9.6  Total Protein 6.5 - 8.1 g/dL - - -  Total Bilirubin 0.3 - 1.2 mg/dL - - -  Alkaline Phos 38 - 126 U/L - - -  AST 15 - 41 U/L - - -  ALT 0 - 44 U/L - - -     Lipid Panel Lab Results  Component Value Date   CHOL 116 07/19/2018   HDL 40 07/19/2018   LDLCALC 55 07/19/2018   TRIG 107 07/19/2018   CHOLHDL 2.9 07/19/2018      Wt Readings from Last 3 Encounters:  09/01/18 148 lb (67.1 kg)  08/29/18 145 lb 8.1 oz (66 kg)  08/08/18 148 lb (67.1 kg)     Other studies Reviewed: Additional studies/ records that were reviewed today include: Office notes, hospital records and testing.  ASSESSMENT AND PLAN:  1.  PAD: She is compliant with aspirin and Brilinta.  Her PAD symptoms on the left are improved. -I evaluated her cath site very carefully and see no reason to start antibiotics or do any further testing.  I discussed this with the patient and explained that if she did notice any drainage or began having fevers, call us immediately. -  I feel she is stable to continue Band-Aids with Neosporin on the cath site.  She may also benefit from using fungal powder in the groin area but I emphasized she is not to get this on her cath site. -She has not been walking much she needs to increase this and build her strength back up as she has a very strenuous job.  Okay to stay out of work until she follows up with Dr. Maurene Capes in a couple of  weeks.  2.  CAD: She is not having any ongoing ischemic symptoms and is not limited by cardiac issues. -Continue current therapy including aspirin, beta-blocker, high-dose statin, ACE inhibitor as well as aspirin and Brilinta  3.  Tobacco use: I emphasized the importance of continued smoking cessation, she is on the nicotine patch  4.  Hypertension: Her blood pressure is elevated in the office today but she assures me it does not run that high at home.  She is encouraged to keep a record of her blood pressures to document good blood pressure control and bring it with her when she sees Dr. Gwenlyn Found    Current medicines are reviewed at length with the patient today.  The patient does not have concerns regarding medicines.  The following changes have been made:  no change  Labs/ tests ordered today include:  No orders of the defined types were placed in this encounter.    Disposition:   FU with Quay Burow, MD  Signed, Rosaria Ferries, PA-C  09/01/2018 2:24 PM    [place] Phone: [phone]; Fax: [phone]

## 2018-09-01 NOTE — Patient Instructions (Signed)
Medication Instructions:  NO CHANGES- Your physician recommends that you continue on your current medications as directed. Please refer to the Current Medication list given to you today. If you need a refill on your cardiac medications before your next appointment, please call your pharmacy.  Labwork: NONE When you have your labs (blood work) drawn today and your tests are completely normal, you will receive your results only by MyChart Message (if you have MyChart) -OR-  A paper copy in the mail.  If you have any lab test that is abnormal or we need to change your treatment, we will call you to review these results.  Special Instructions: CONTINUE TO APPLY NEOSPORIN AND BANDAGES-KEEP CLEAN AND DRY. CALL IMMEDIATELY IF YOU HAVE ANY DRAINAGE, FEVER OR CHILLS  INCREASE ACTIVITY AS TOLERATED  LOG BP DAILY  CONTINUE SMOKING CESSASION  Follow-Up: You will need a follow up appointment KEEP SCHEDULED APPOINTMENTS ON THE 14TH AND THE 18TH.  You may see Quay Burow, MD or one of the following Advanced Practice Providers on your designated Care Team:  Kerin Ransom, PA-C Roby Lofts, PA-C Sande Rives, [facility], you and your health needs are our priority.  As part of our continuing mission to provide you with exceptional heart care, we have created designated Provider Care Teams.  These Care Teams include your primary Cardiologist (physician) and Advanced Practice Providers (APPs -  Physician Assistants and Nurse Practitioners) who all work together to provide you with the care you need, when you need it.  Thank you for choosing [facility] at [facility]!!

## 2018-09-01 NOTE — Telephone Encounter (Signed)
Spoke with pt, she had a procedure Monday and she has smelt a rotten odor since then. She is not able to see the stick site and the dressing is just a band aid now. She reports the smell is getting worse. She would like someone to look at her site. Follow up scheduled

## 2018-09-01 NOTE — Telephone Encounter (Signed)
New message   Patient states that she has an odor around area  from a peripheral angio procedure on 08/28/18. Please call the patient.

## 2018-09-03 ENCOUNTER — Other Ambulatory Visit: Payer: Self-pay | Admitting: Adult Health

## 2018-09-04 ENCOUNTER — Other Ambulatory Visit: Payer: Self-pay | Admitting: Adult Health

## 2018-09-04 ENCOUNTER — Other Ambulatory Visit (HOSPITAL_COMMUNITY): Payer: Self-pay | Admitting: Cardiovascular Disease

## 2018-09-04 ENCOUNTER — Other Ambulatory Visit (HOSPITAL_COMMUNITY): Payer: Self-pay | Admitting: Surgery

## 2018-09-04 DIAGNOSIS — I739 Peripheral vascular disease, unspecified: Secondary | ICD-10-CM

## 2018-09-04 MED ORDER — TICAGRELOR 90 MG PO TABS: 90.0000 mg | ORAL_TABLET | Freq: Two times a day (BID) | ORAL | 3 refills | Status: DC

## 2018-09-04 NOTE — Telephone Encounter (Signed)
Rx request sent to pharmacy.  

## 2018-09-04 NOTE — Telephone Encounter (Signed)
New Message    *STAT* If patient is at the pharmacy, call can be transferred to refill team.   1. Which medications need to be refilled? (please list name of each medication and dose if known) ticagrelor (BRILINTA) 90 MG TABS tablet  2. Which pharmacy/location (including street and city if local pharmacy) is medication to be sent to? [facility] ([facility] [place] - [address]  3. Do they need a 30 day or 90 day supply? 30 day supply

## 2018-09-11 ENCOUNTER — Encounter (HOSPITAL_COMMUNITY): Payer: Medicare HMO

## 2018-09-15 ENCOUNTER — Ambulatory Visit (HOSPITAL_COMMUNITY)
Admission: RE | Admit: 2018-09-15 | Discharge: 2018-09-15 | Disposition: A | Discharge status: Home / Self Care | Payer: Medicare HMO | Source: Ambulatory Visit | Attending: Cardiology | Admitting: Cardiology

## 2018-09-15 DIAGNOSIS — I739 Peripheral vascular disease, unspecified: Secondary | ICD-10-CM | POA: Diagnosis not present

## 2018-09-19 ENCOUNTER — Encounter: Payer: Self-pay | Admitting: Cardiovascular Disease

## 2018-09-19 ENCOUNTER — Ambulatory Visit: Payer: Medicare HMO | Admitting: Cardiovascular Disease

## 2018-09-19 VITALS — BP 128/66 | HR 76 | Ht 63.0 in | Wt 147.2 lb

## 2018-09-19 DIAGNOSIS — Z720 Tobacco use: Secondary | ICD-10-CM | POA: Diagnosis not present

## 2018-09-19 DIAGNOSIS — I10 Essential (primary) hypertension: Secondary | ICD-10-CM | POA: Diagnosis not present

## 2018-09-19 DIAGNOSIS — I214 Non-ST elevation (NSTEMI) myocardial infarction: Secondary | ICD-10-CM

## 2018-09-19 DIAGNOSIS — R0989 Other specified symptoms and signs involving the circulatory and respiratory systems: Secondary | ICD-10-CM | POA: Diagnosis not present

## 2018-09-19 DIAGNOSIS — I739 Peripheral vascular disease, unspecified: Secondary | ICD-10-CM

## 2018-09-19 DIAGNOSIS — E782 Mixed hyperlipidemia: Secondary | ICD-10-CM | POA: Diagnosis not present

## 2018-09-19 NOTE — Progress Notes (Signed)
09/19/2018 Cassandra Santana   [birth year]  [id number]  Primary Physician [name], DO Primary Cardiologist: [name] MD Cassandra Santana, [place], [place]  HPI:  Cassandra Santana is a 68 y.o.   mildly overweight divorced African-American female with no children who works as a Quarry manager.  I last saw her in the office 08/08/2018 .  She had a recent non-STEMI 04/05/2018. She underwent radial diagnostic cath by Dr. Burt Knack the following day revealing a high-grade mid AV groove circumflex which underwent PCI and drug-eluting stenting using a synergy drug-eluting stent. She had mild disease in approximately the moderate disease of the PDA. EF was normal. She is placed on dual antiplatelet therapy including aspirin and Brilinta as well as high-dose statin therapy. The problems include treated hypertension although suboptimally and hyperlipidemia on high-dose statin therapy although she does complain of some myalgias related to this. Because of plaints of bilateral lower extremity pain she had Doppler studies performed 08/04/2018 revealing a right ABI of 0.71 and a left ABI 0.64.  She had an occluded distal right SFA and an occluded left popliteal artery.  Her symptoms are lifestyle limiting.  She did stop smoking the day after her non-STEMI. She underwent peripheral angiography by myself 08/28/2018 documenting bilateral SFA CTO's with three-vessel runoff.  She underwent Hawk 1 directional atherectomy followed by drug-coated balloon angioplasty on the left SFA with an excellent angiographic result.  Her claudication has resolved and her Dopplers performed 09/15/2018 have markedly improved.    Current Meds  Medication Sig  . acetaminophen (TYLENOL) 500 MG tablet Take 1,000 mg by mouth daily as needed for moderate pain or headache.  Marland Kitchen amLODipine (NORVASC) 5 MG tablet Take 1 tablet (5 mg total) by mouth daily. (Patient taking differently: Take 5 mg by mouth at bedtime. )  . aspirin EC 81 MG EC tablet Take 1  tablet (81 mg total) by mouth daily.  Marland Kitchen atorvastatin (LIPITOR) 80 MG tablet Take 1 tablet (80 mg total) by mouth daily at 6 PM.  . lisinopril (PRINIVIL,ZESTRIL) 10 MG tablet Take 1 tablet (10 mg total) by mouth daily.  . Melatonin 5 MG CAPS Take 10 mg by mouth at bedtime as needed (sleep).  . metoprolol succinate (TOPROL-XL) 25 MG 24 hr tablet Take 50 mg by mouth daily.  . nicotine (NICODERM CQ - DOSED IN MG/24 HOURS) 14 mg/24hr patch Place 1 patch (14 mg total) onto the skin daily. (Patient taking differently: Place 14 mg onto the skin daily as needed (smoking cessation). )  . nitroGLYCERIN (NITROSTAT) 0.4 MG SL tablet Place 1 tablet (0.4 mg total) under the tongue every 5 (five) minutes x 3 doses as needed for chest pain.  . ticagrelor (BRILINTA) 90 MG TABS tablet Take 1 tablet (90 mg total) by mouth 2 (two) times daily.     Allergies  Allergen Reactions  . Keflex [Cephalexin] Hives  . Tomato Hives and Itching    Social History   Socioeconomic History  . Marital status: Legally Separated    Spouse name: Not on file  . Number of children: Not on file  . Years of education: Not on file  . Highest education level: Not on file  Occupational History  . Occupation: Forensic psychologist: [name]  Social Needs  . Financial resource strain: Not on file  . Food insecurity:    Worry: Not on file    Inability: Not on file  . Transportation needs:    Medical:  Not on file    Non-medical: Not on file  Tobacco Use  . Smoking status: Former Smoker    Packs/day: 0.50    Types: Cigarettes    Last attempt to quit: 04/09/2018    Years since quitting: 0.4  . Smokeless tobacco: Never Used  Substance and Sexual Activity  . Alcohol use: No  . Drug use: Yes    Types: Marijuana  . Sexual activity: Yes    Birth control/protection: Surgical  Lifestyle  . Physical activity:    Days per week: Not on file    Minutes per session: Not on file  . Stress: Not on file  Relationships  . Social  connections:    Talks on phone: Not on file    Gets together: Not on file    Attends religious service: Not on file    Active member of club or organization: Not on file    Attends meetings of clubs or organizations: Not on file    Relationship status: Not on file  . Intimate partner violence:    Fear of current or ex partner: Not on file    Emotionally abused: Not on file    Physically abused: Not on file    Forced sexual activity: Not on file  Other Topics Concern  . Not on file  Social History Narrative  . Not on file     Review of Systems: General: negative for chills, fever, night sweats or weight changes.  Cardiovascular: negative for chest pain, dyspnea on exertion, edema, orthopnea, palpitations, paroxysmal nocturnal dyspnea or shortness of breath Dermatological: negative for rash Respiratory: negative for cough or wheezing Urologic: negative for hematuria Abdominal: negative for nausea, vomiting, diarrhea, bright red blood per rectum, melena, or hematemesis Neurologic: negative for visual changes, syncope, or dizziness All other systems reviewed and are otherwise negative except as noted above.    Blood pressure 128/66, pulse 76, height 5\' 3"  (1.6 m), weight 147 lb 3.2 oz (66.8 kg).  General appearance: alert and no distress Neck: no adenopathy, no JVD, supple, symmetrical, trachea midline, thyroid not enlarged, symmetric, no tenderness/mass/nodules and Right carotid bruit Lungs: clear to auscultation bilaterally Heart: regular rate and rhythm, S1, S2 normal, no murmur, click, rub or gallop Extremities: extremities normal, atraumatic, no cyanosis or edema Pulses: 2+ left pedal pulse, absent right pedal pulse Skin: Skin color, texture, turgor normal. No rashes or lesions Neurologic: Alert and oriented X 3, normal strength and tone. Normal symmetric reflexes. Normal coordination and gait  EKG not performed today  ASSESSMENT AND PLAN:   NSTEMI (non-ST elevated  myocardial infarction) ([place]) History of CAD status post recent non-STEMI 04/05/2018 with radial diagnostic cath performed Dr. Burt Knack revealing high-grade mid AV groove circumflex circumflex stenosis which underwent PCI and drug-eluting stenting using a synergy drug-eluting stent.  She had mild disease and PDA with normal EF shear.  She remains on dual antiplatelet therapy denies chest pain or shortness of breath.  Hypertension History of essential hypertension her blood pressure measured today at 128/66.  She is on amlodipine, lisinopril and metoprolol.  Hyperlipidemia History of hyperlipidemia on high-dose statin therapy with lipid profile performed 07/19/2018 revealing total cholesterol 116, LDL 55 and HDL 40  Tobacco use History of discontinue tobacco abuse  Peripheral arterial disease ([facility]) History of PAD status post peripheral angiography 08/28/2018 with document documented bilateral SFA CTO status post [facility] 1 directional atherectomy followed by drug-coated balloon angioplasty of the left SFA.  Her follow-up Dopplers performed on 09/15/2018 revealed  a widely patent left SFA.  Her claudication is markedly improved.  She still has residual disease on the right side which she wishes to stage in several months.      [name] MD FACP,FACC,FAHA, [facility] 09/19/2018 2:27 PM

## 2018-09-19 NOTE — Patient Instructions (Addendum)
Medication Instructions:  Your physician recommends that you continue on your current medications as directed. Please refer to the Current Medication list given to you today. If you need a refill on your cardiac medications before your next appointment, please call your pharmacy.   Lab work: NONE If you have labs (blood work) drawn today and your tests are completely normal, you will receive your results only by: Marland Kitchen MyChart Message (if you have MyChart) OR . A paper copy in the mail If you have any lab test that is abnormal or we need to change your treatment, we will call you to review the results.  Testing/Procedures: Your physician has requested that you have a lower or upper extremity arterial duplex. This test is an ultrasound of the arteries in the legs or arms. It looks at arterial blood flow in the legs and arms. Allow one hour for Lower and Upper Arterial scans. There are no restrictions or special instructions  SCHEDULE EVERY 6 MONTHS (NEXT AUGUST 2020)   Your physician has requested that you have an ankle brachial index (ABI). During this test an ultrasound and blood pressure cuff are used to evaluate the arteries that supply the arms and legs with blood. Allow thirty minutes for this exam. There are no restrictions or special instructions.  SCHEDULE EVERY 6 MONTHS (NEXT AUGUST 2020)  Your physician has requested that you have a carotid duplex. This test is an ultrasound of the carotid arteries in your neck. It looks at blood flow through these arteries that supply the brain with blood. Allow one hour for this exam. There are no restrictions or special instructions.  SCHEDULE January 2021  Follow-Up: At [facility], you and your health needs are our priority.  As part of our continuing mission to provide you with exceptional heart care, we have created designated Provider Care Teams.  These Care Teams include your primary Cardiologist (physician) and Advanced Practice Providers  (APPs -  Physician Assistants and Nurse Practitioners) who all work together to provide you with the care you need, when you need it. . You will need a follow up appointment in 3 months. You may see Dr. Gwenlyn Found or one of the following Advanced Practice Providers on your designated Care Team:   . Kerin Ransom, [place] . Almyra Deforest, PA-C . Fabian Sharp, PA-C . Jory Sims, DNP . Rosaria Ferries, PA-C . Roby Lofts, PA-C . Sande Rives, PA-C

## 2018-09-19 NOTE — Assessment & Plan Note (Signed)
History of discontinue tobacco abuse. 

## 2018-09-19 NOTE — Assessment & Plan Note (Signed)
History of CAD status post recent non-STEMI 04/05/2018 with radial diagnostic cath performed Dr. Burt Knack revealing high-grade mid AV groove circumflex circumflex stenosis which underwent PCI and drug-eluting stenting using a synergy drug-eluting stent.  She had mild disease and PDA with normal EF shear.  She remains on dual antiplatelet therapy denies chest pain or shortness of breath.

## 2018-09-19 NOTE — Assessment & Plan Note (Signed)
History of hyperlipidemia on high-dose statin therapy with lipid profile performed 07/19/2018 revealing total cholesterol 116, LDL 55 and HDL 40

## 2018-09-19 NOTE — Assessment & Plan Note (Signed)
History of PAD status post peripheral angiography 08/28/2018 with document documented bilateral SFA CTO status post [facility] 1 directional atherectomy followed by drug-coated balloon angioplasty of the left SFA.  Her follow-up Dopplers performed on 09/15/2018 revealed a widely patent left SFA.  Her claudication is markedly improved.  She still has residual disease on the right side which she wishes to stage in several months.

## 2018-09-19 NOTE — Assessment & Plan Note (Signed)
History of essential hypertension her blood pressure measured today at 128/66.  She is on amlodipine, lisinopril and metoprolol.

## 2018-10-17 ENCOUNTER — Ambulatory Visit: Payer: Medicare HMO | Admitting: Cardiovascular Disease

## 2018-10-18 ENCOUNTER — Other Ambulatory Visit: Payer: Self-pay

## 2018-10-18 ENCOUNTER — Encounter (HOSPITAL_COMMUNITY): Payer: Self-pay | Admitting: Emergency Medicine

## 2018-10-18 ENCOUNTER — Ambulatory Visit (HOSPITAL_COMMUNITY)
Admission: EM | Admit: 2018-10-18 | Discharge: 2018-10-18 | Disposition: A | Discharge status: Home / Self Care | Payer: Medicare HMO | Attending: Family Medicine | Admitting: Family Medicine

## 2018-10-18 DIAGNOSIS — J069 Acute upper respiratory infection, unspecified: Secondary | ICD-10-CM | POA: Diagnosis not present

## 2018-10-18 DIAGNOSIS — H9201 Otalgia, right ear: Secondary | ICD-10-CM

## 2018-10-18 NOTE — ED Provider Notes (Addendum)
[place]   [id number] 10/18/18 Arrival Time: [place]:  1. Viral upper respiratory tract infection   2. Otalgia of right ear    Work note provided. No sign of ear infection. No concern for COVID-19. Testing not indicated. Discussed.  Cough medication sedation precautions. Discussed typical duration of symptoms. OTC symptom care as needed. Ensure adequate fluid intake and rest. May f/u with PCP or here as needed.  Reviewed expectations re: course of current medical issues. Questions answered. Outlined signs and symptoms indicating need for more acute intervention. Patient verbalized understanding. After Visit Summary given.   SUBJECTIVE: History from: patient.  Cassandra Santana is a 68 y.o. female who reports a possible exposure to a person who has been tested for COVID-19. No known results of testing. [name] reports mild nasal congestion, sore throat, and bilateral ear discomfort beginning three days ago. No worsening. Afebrile. No cough or SOB reported. "Just wanted to get checked out." No OTC tx. No specific aggravating or alleviating factors reported.  Social History   Tobacco Use  Smoking Status Former Smoker  . Packs/day: 0.50  . Types: Cigarettes  . Last attempt to quit: 04/09/2018  . Years since quitting: 0.5  Smokeless Tobacco Never Used    ROS: As per HPI.   OBJECTIVE:  Vitals:   10/18/18 1634  BP: (!) 147/67  Pulse: 68  Resp: 16  Temp: 98.3 F (36.8 C)  TempSrc: Oral  SpO2: 98%     General appearance: alert; no distress HEENT: nasal congestion; clear runny nose; throat irritation secondary to post-nasal drainage; bilateral TMs normal Neck: supple without LAD CV: RRR Lungs: unlabored respirations, symmetrical air entry without wheezing; cough: absent Abd: soft Ext: no LE edema Skin: warm and dry Psychological: alert and cooperative; normal mood and affect   Allergies  Allergen Reactions  . Keflex  [Cephalexin] Hives  . Tomato Hives and Itching    Past Medical History:  Diagnosis Date  . Colitis   . Hypertension   . Internal hemorrhoids   . NSTEMI (non-ST elevated myocardial infarction) ([place]) 04/2018  . PAD (peripheral artery disease) ([place])   . Rectal polyp    hyperplastic   Family History  Problem Relation Age of Onset  . Kidney failure Mother   . Heart attack Father    Social History   Socioeconomic History  . Marital status: Legally Separated    Spouse name: Not on file  . Number of children: Not on file  . Years of education: Not on file  . Highest education level: Not on file  Occupational History  . Occupation: Forensic psychologist: [name]  Social Needs  . Financial resource strain: Not on file  . Food insecurity:    Worry: Not on file    Inability: Not on file  . Transportation needs:    Medical: Not on file    Non-medical: Not on file  Tobacco Use  . Smoking status: Former Smoker    Packs/day: 0.50    Types: Cigarettes    Last attempt to quit: 04/09/2018    Years since quitting: 0.5  . Smokeless tobacco: Never Used  Substance and Sexual Activity  . Alcohol use: No  . Drug use: Yes    Types: Marijuana  . Sexual activity: Yes    Birth control/protection: Surgical  Lifestyle  . Physical activity:    Days per week: Not on file    Minutes per session: Not on file  .  Stress: Not on file  Relationships  . Social connections:    Talks on phone: Not on file    Gets together: Not on file    Attends religious service: Not on file    Active member of club or organization: Not on file    Attends meetings of clubs or organizations: Not on file    Relationship status: Not on file  . Intimate partner violence:    Fear of current or ex partner: Not on file    Emotionally abused: Not on file    Physically abused: Not on file    Forced sexual activity: Not on file  Other Topics Concern  . Not on file  Social History Narrative  . Not on file            Vanessa Kick, MD 10/19/18 [place], [place], MD 10/19/18 [phone]

## 2018-10-18 NOTE — ED Triage Notes (Addendum)
Pt states a friend was "tested" for the virus and she played cards with this friend on Friday. No positive test results. No travel. No KNOWN contact with someone positive for the test. Pt c/o sore throat since Monday and ear pain.

## 2018-10-30 ENCOUNTER — Other Ambulatory Visit: Payer: Self-pay | Admitting: Cardiovascular Disease

## 2018-10-30 MED ORDER — TICAGRELOR 90 MG PO TABS: 90.0000 mg | ORAL_TABLET | Freq: Two times a day (BID) | ORAL | 1 refills | Status: DC

## 2018-10-30 NOTE — Telephone Encounter (Signed)
°*  STAT* If patient is at the pharmacy, call can be transferred to refill team.   1. Which medications need to be refilled? (please list name of each medication and dose if known) ticagrelor (BRILINTA) 90 MG TABS tablet  2. Which pharmacy/location (including street and city if local pharmacy) is medication to be sent to? [facility] ([place]), [place] - [address]  3. Do they need a 30 day or 90 day supply? 90 days

## 2018-10-31 ENCOUNTER — Telehealth: Payer: Self-pay | Admitting: Cardiovascular Disease

## 2018-10-31 NOTE — Telephone Encounter (Signed)
New Message           Patient states the "Brilinta" is $135.00 that is too expensive for her to pay. Patient would like something cheaper. Pls call to advise.

## 2018-10-31 NOTE — Telephone Encounter (Signed)
Left message to call back  

## 2018-10-31 NOTE — Telephone Encounter (Signed)
Follow Up:    Pt wants to know what medicine was called in for her yesterday? She says she usually pays 8.95. Today when she went to get it, it was 135.00. I do not think she picked it up, because it was so expensive.

## 2018-11-01 NOTE — Telephone Encounter (Signed)
New message   Patient returning Sharon's call.

## 2018-11-02 MED ORDER — CLOPIDOGREL BISULFATE 75 MG PO TABS: 75.0000 mg | ORAL_TABLET | Freq: Every day | ORAL | 3 refills | Status: DC

## 2018-11-02 MED ORDER — TICAGRELOR 90 MG PO TABS: 90.0000 mg | ORAL_TABLET | Freq: Two times a day (BID) | ORAL | 0 refills | Status: DC

## 2018-11-02 NOTE — Telephone Encounter (Signed)
Cardiologist to address  P2Y12 changes

## 2018-11-02 NOTE — Telephone Encounter (Signed)
Routing to MD to decide on change from [place].   Patient aware of samples.

## 2018-11-02 NOTE — Telephone Encounter (Signed)
Can change Brilinta to clopidogrel 75 mg a day.  When she stops her Brilinta make sure she takes a 300 mg of clopidogrel load on day 1 followed by 75 mg a day thereafter.

## 2018-11-02 NOTE — Telephone Encounter (Signed)
Patient called earlier stating that cost of Brilinta 90 MG is $135.00 and that is too expensive for her to pay. Patient would like something cheaper. Message routed to Dr. Gwenlyn Found and pharmD for review.  Left message to call back and also that note has been routed and samples available at front desk  Medication Samples available at front desk.  Drug name: BRILINTA       Strength: 90 MG        Qty: 3 BOTTLES  LOT: [id number]  Exp.Date: 05-2021

## 2018-11-02 NOTE — Telephone Encounter (Signed)
Advised pt of the following:  When you finish your Brilinta, you may start taking this medication.   The first day you take your clopidogrel (Plavix), make sure you take 300 mg at once (4 tablets) as a "loading dose" to help get the amount of the drug to the appropriate therapeutic level in your body.  Take 1 tablet (75 mg) a day starting the day after and continue taking 75 mg a day from then on.  Pt verbalized understanding

## 2018-11-02 NOTE — Telephone Encounter (Signed)
  Patient is calling to check on the Brilinta situation. She will not pay the $135.00 for it and would like a medication change to something more affordable. She will run out of medication on Friday 11/03/18. I did let her know there is samples available at the front desk for her. She will try to make arrangements to pick the samples up.

## 2018-12-19 ENCOUNTER — Telehealth: Payer: Self-pay | Admitting: Cardiovascular Disease

## 2018-12-20 ENCOUNTER — Telehealth: Payer: Self-pay | Admitting: Cardiovascular Disease

## 2018-12-20 NOTE — Telephone Encounter (Signed)
Called patient and LVM cancelling her appt with Dr. Gwenlyn Found for 12-22-18.  She needs to schedule appt on a day that Dr. Gwenlyn Found is in the office.

## 2018-12-22 ENCOUNTER — Ambulatory Visit: Payer: Medicare HMO | Admitting: Cardiovascular Disease

## 2018-12-22 ENCOUNTER — Telehealth (INDEPENDENT_AMBULATORY_CARE_PROVIDER_SITE_OTHER): Payer: Medicare HMO | Admitting: Cardiovascular Disease

## 2018-12-22 ENCOUNTER — Telehealth: Payer: Self-pay

## 2018-12-22 ENCOUNTER — Encounter: Payer: Self-pay | Admitting: Cardiovascular Disease

## 2018-12-22 DIAGNOSIS — E782 Mixed hyperlipidemia: Secondary | ICD-10-CM

## 2018-12-22 DIAGNOSIS — I10 Essential (primary) hypertension: Secondary | ICD-10-CM

## 2018-12-22 DIAGNOSIS — I214 Non-ST elevation (NSTEMI) myocardial infarction: Secondary | ICD-10-CM

## 2018-12-22 DIAGNOSIS — I739 Peripheral vascular disease, unspecified: Secondary | ICD-10-CM | POA: Diagnosis not present

## 2018-12-22 DIAGNOSIS — Z720 Tobacco use: Secondary | ICD-10-CM

## 2018-12-22 NOTE — Patient Instructions (Signed)
Medication Instructions:  Your physician recommends that you continue on your current medications as directed. Please refer to the Current Medication list given to you today.  If you need a refill on your cardiac medications before your next appointment, please call your pharmacy.   Lab work: NONE If you have labs (blood work) drawn today and your tests are completely normal, you will receive your results only by: Marland Kitchen MyChart Message (if you have MyChart) OR . A paper copy in the mail If you have any lab test that is abnormal or we need to change your treatment, we will call you to review the results.  Testing/Procedures: Your physician has requested that you have a lower or upper extremity arterial duplex. This test is an ultrasound of the arteries in the legs or arms. It looks at arterial blood flow in the legs and arms. Allow one hour for Lower and Upper Arterial scans. There are no restrictions or special instructions TO BE SCHEDULED FOR February 2021. YOU WILL BE CONTACTED BY A SCHEDULER TO SET UP THIS APPOINTMENT.  Your physician has requested that you have an ankle brachial index (ABI). During this test an ultrasound and blood pressure cuff are used to evaluate the arteries that supply the arms and legs with blood. Allow thirty minutes for this exam. There are no restrictions or special instructions. TO BE SCHEDULED FOR February 2021. YOU WILL BE CONTACTED BY A SCHEDULER TO SET UP THIS APPOINTMENT.   Follow-Up: At [facility], you and your health needs are our priority.  As part of our continuing mission to provide you with exceptional heart care, we have created designated Provider Care Teams.  These Care Teams include your primary Cardiologist (physician) and Advanced Practice Providers (APPs -  Physician Assistants and Nurse Practitioners) who all work together to provide you with the care you need, when you need it. You will need a follow up appointment in 6 months WITH DR. Gwenlyn Found.   Please call our office 2 months in advance to schedule this appointment.

## 2018-12-22 NOTE — Progress Notes (Signed)
Virtual Visit via Telephone Note   This visit type was conducted due to national recommendations for restrictions regarding the COVID-19 Pandemic (e.g. social distancing) in an effort to limit this patient's exposure and mitigate transmission in our community.  Due to her co-morbid illnesses, this patient is at least at moderate risk for complications without adequate follow up.  This format is felt to be most appropriate for this patient at this time.  The patient did not have access to video technology/had technical difficulties with video requiring transitioning to audio format only (telephone).  All issues noted in this document were discussed and addressed.  No physical exam could be performed with this format.  Please refer to the patient's chart for her  consent to telehealth for [facility].   Date:  12/22/2018   ID:  Cassandra Santana, Cassandra Santana 09-13-1950, MRN [id number]  Patient Location: Home Provider Location: Home  PCP:  [name], DO  Cardiologist:  Quay Burow, MD  Electrophysiologist:  None   Evaluation Performed:  Follow-Up Visit  Chief Complaint: Follow-up CAD and PAD  History of Present Illness:    Cassandra Santana is a 68 y.o.  mildly overweight divorced African-American female with no children who works as a Quarry manager.I last saw her in the office  09/19/2018 .  She had a recent non-STEMI 04/05/2018. She underwent radial diagnostic cath by Dr. Burt Knack the following day revealing a high-grade mid AV groove circumflex which underwent PCI and drug-eluting stenting using a synergy drug-eluting stent. She had mild disease in approximately the moderate disease of the PDA. EF was normal. She is placed on dual antiplatelet therapy including aspirin and Brilinta as well as high-dose statin therapy. The problems include treated hypertension although suboptimally and hyperlipidemia on high-dose statin therapy although she does complain of some myalgias related to this.  Because of plaints of bilateral lower extremity pain she had Doppler studies performed 08/04/2018 revealing a right ABI of 0.71 and a left ABI 0.64. She had an occluded distal right SFA and an occluded left popliteal artery. Her symptoms are lifestyle limiting. She did stop smoking the day after her non-STEMI. She underwent peripheral angiography by myself 08/28/2018 documenting bilateral SFA CTO's with three-vessel runoff.  She underwent [place] 1 directional atherectomy followed by drug-coated balloon angioplasty on the left SFA with an excellent angiographic result.  Her claudication has resolved and her Dopplers performed 09/15/2018 have markedly improved.  She says that since changing from Brilinta to Plavix she no longer has lower extremity claudication.  Unfortunately, she has gone back to smoking several cigarettes a day.  She denies chest pain or shortness of breath.  She does have a dental abscess and needs tooth extraction.  She is on DAPT which we discussed about the risk of discontinuing with regards to acute stent thrombosis.   The patient does not have symptoms concerning for COVID-19 infection (fever, chills, cough, or new shortness of breath).    Past Medical History:  Diagnosis Date  . Colitis   . Hypertension   . Internal hemorrhoids   . NSTEMI (non-ST elevated myocardial infarction) ([place]) 04/2018  . PAD (peripheral artery disease) ([place])   . Rectal polyp    hyperplastic   Past Surgical History:  Procedure Laterality Date  . ABDOMINAL AORTOGRAM W/LOWER EXTREMITY Bilateral 08/28/2018   Procedure: ABDOMINAL AORTOGRAM W/LOWER EXTREMITY;  Surgeon: [name], MD;  Location: [place] [facility];  Service: Cardiovascular;  Laterality: Bilateral;  . ABDOMINAL HYSTERECTOMY  1993  .  CORONARY STENT INTERVENTION N/A 04/06/2018   Procedure: CORONARY STENT INTERVENTION;  Surgeon: Sherren Mocha, MD;  Location: [place] [facility];  Service: Cardiovascular;  Laterality: N/A;  . LEFT  HEART CATH AND CORONARY ANGIOGRAPHY N/A 04/06/2018   Procedure: LEFT HEART CATH AND CORONARY ANGIOGRAPHY;  Surgeon: Sherren Mocha, MD;  Location: [place] [facility];  Service: Cardiovascular;  Laterality: N/A;  . PERIPHERAL VASCULAR ATHERECTOMY Left 08/28/2018   Procedure: PERIPHERAL VASCULAR ATHERECTOMY;  Surgeon: [name], MD;  Location: [place] [facility];  Service: Cardiovascular;  Laterality: Left;  SFA  . ROTATOR CUFF REPAIR Right      No outpatient medications have been marked as taking for the 12/22/18 encounter (Appointment) with [name], MD.     Allergies:   Keflex [cephalexin] and Tomato   Social History   Tobacco Use  . Smoking status: Former Smoker    Packs/day: 0.50    Types: Cigarettes    Last attempt to quit: 04/09/2018    Years since quitting: 0.7  . Smokeless tobacco: Never Used  Substance Use Topics  . Alcohol use: No  . Drug use: Yes    Types: Marijuana     Family Hx: The patient's family history includes Heart attack in her father; Kidney failure in her mother.  ROS:   Please see the history of present illness.     All other systems reviewed and are negative.   Prior CV studies:   The following studies were reviewed today:  Lower extremity arterial Doppler studies performed 09/15/2018  Labs/Other Tests and Data Reviewed:    EKG:  No ECG reviewed.  Recent Labs: 04/05/2018: ALT 9 08/17/2018: TSH 1.320 08/29/2018: BUN 7; Creatinine, Ser 0.93; Hemoglobin 8.8; Platelets 332; Potassium 3.2; Sodium 140   Recent Lipid Panel Lab Results  Component Value Date/Time   CHOL 116 07/19/2018 11:51 AM   TRIG 107 07/19/2018 11:51 AM   HDL 40 07/19/2018 11:51 AM   CHOLHDL 2.9 07/19/2018 11:51 AM   CHOLHDL 5.2 04/06/2018 04:55 AM   LDLCALC 55 07/19/2018 11:51 AM    Wt Readings from Last 3 Encounters:  09/19/18 147 lb 3.2 oz (66.8 kg)  09/01/18 148 lb (67.1 kg)  08/29/18 145 lb 8.1 oz (66 kg)     Objective:    Vital Signs:  There were no  vitals taken for this visit.   VITAL SIGNS:  reviewed a complete physical exam was not performed today since this was a virtual telemedicine phone visit  ASSESSMENT & PLAN:    1. Coronary artery disease- history of CAD status post non-STEMI 04/05/2018 treated with urgent catheterization performed radially by Dr. Burt Knack with PCI and drug-eluting stenting of the AV groove circumflex using a synergy drug-eluting stent.  She had noncritical disease otherwise with normal LV function.  She was originally on Brilinta and was transitioned to Plavix.  She denies chest pain or shortness of breath. 2. Peripheral arterial disease- history of PAD with angiography performed by myself 08/28/2018 revealing bilateral SFA CTO's with three-vessel runoff.  I performed [facility] 1 directional atherectomy followed by drug-coated balloon angioplasty of her left SFA with an excellent angiographic and clinical result.  Her Dopplers performed 09/15/2018 showed normalization of her left ABI.  She currently denies claudication. 3. Essential hypertension- history of essential hypertension with blood pressure measured by the patient at home today of 125/69 with a pulse of 68.  She is on amlodipine and lisinopril 4. Hyperlipidemia- history of hyperlipidemia on atorvastatin with lipid  profile performed 07/19/2018 revealing total cholesterol 116, LDL 55 and HDL 40 5. Tobacco abuse- she initially stopped smoking at the time of her non-STEMI but has since gone back currently smoking 2 to 3 cigarettes a day.  I have counseled her on the importance of smoking cessation  COVID-19 Education: The signs and symptoms of COVID-19 were discussed with the patient and how to seek care for testing (follow up with PCP or arrange E-visit).  The importance of social distancing was discussed today.  Time:   Today, I have spent 8 minutes with the patient with telehealth technology discussing the above problems.     Medication Adjustments/Labs and Tests  Ordered: Current medicines are reviewed at length with the patient today.  Concerns regarding medicines are outlined above.   Tests Ordered: No orders of the defined types were placed in this encounter.   Medication Changes: No orders of the defined types were placed in this encounter.   Disposition:  Follow up in 6 month(s)  Signed, Quay Burow, MD  12/22/2018 8:07 AM    [place]

## 2018-12-22 NOTE — Telephone Encounter (Signed)
Patient and/or DPR-approved person aware of AVS instructions and verbalized understanding. Letter including After Visit Summary and any other necessary documents to be mailed to the patient's address on file.  

## 2018-12-29 ENCOUNTER — Telehealth: Payer: Self-pay | Admitting: Cardiovascular Disease

## 2018-12-29 NOTE — Telephone Encounter (Signed)
Spoke with pt. She report for the past month she has noticed that she feels dizzy sometimes a night and BP tends to run lower. She report Wednesday night 5/26 BP was 98/53, Thursday 98/54 and 100/52 and today 132/74. Pt also report having a syncopal episode 2 weeks ago. She denies symptoms at this moment. Pt state she takes her Metoprolol in the morning and amlodipine at night. Will route to MD for recommendation but advised pt to report to ED or urgent care if symptoms reoccur. Pt voiced understanding.

## 2018-12-29 NOTE — Telephone Encounter (Signed)
Pt states she is on 2 BP medications, amlodipine and metoprolol. The pharmacy recommends she take amlodipine at night and the metoprolol in the morning. Pt called to say that her BP has been low at night (98/53 Wednesday, 98/54 Thursday) She is concerned that it gets low at night At time of phone call . 4pm Friday, it was 132/74.  She wants to know if she should still be taking the amlodipine at night if her bp continues to be low

## 2018-12-30 NOTE — Telephone Encounter (Signed)
Have her decrease her lisinipril from 10--->5 mg daily. Keep a BP log for 30 days and have Kristen call her in 4 weeks to review

## 2019-01-04 ENCOUNTER — Telehealth: Payer: Self-pay | Admitting: Cardiovascular Disease

## 2019-01-04 NOTE — Telephone Encounter (Signed)
New Message ° ° ° °Pt is returning call  ° ° ° °Please call back  °

## 2019-01-04 NOTE — Telephone Encounter (Signed)
lmtcb

## 2019-01-09 MED ORDER — LISINOPRIL 5 MG PO TABS: 5.0000 mg | ORAL_TABLET | Freq: Every day | ORAL | 0 refills | Status: DC

## 2019-01-09 NOTE — Telephone Encounter (Signed)
Contacted pt and advised of the following from Dr. Gwenlyn Found:  "Have her decrease her lisinopril from 10--->5 mg daily. Keep a BP log for 30 days and have Kristen call her in 4 weeks to review"  Pt states that she d/c lisinopril herself d/t systolic BPs in the 28A. Reviewed BP meds with pt. Pt states prior to self-discontinue of lisinopril, she was taking metoprolol, lisinopril, and amlodipine. Informed pt that per 12/22/2018 OV note, pt was to be on amlodipine and lisinopril alone for BP. Advised pt to stop taking metoprolol and take 5 mg (half) lisinopril daily as recommended and 5 mg amlodipine each evening per sig. Also advised to keep BP log for 30 days and pharmD will f/u with pt regarding BP log. Pt verbalized understanding

## 2019-01-30 NOTE — Telephone Encounter (Signed)
Opened in error

## 2019-02-05 ENCOUNTER — Telehealth: Payer: Self-pay | Admitting: Cardiovascular Disease

## 2019-02-05 NOTE — Telephone Encounter (Signed)
   [facility] Pre-operative Risk Assessment    Request for surgical clearance:  1. What type of surgery is being performed? 3 surgical & 4 simple dental extractions   2. When is this surgery scheduled? TBD   3. What type of clearance is required (medical clearance vs. Pharmacy clearance to hold med vs. Both)? Both - clearance note says "recent myocardial infarction & current antiplatelet therapy"  4. Are there any medications that need to be held prior to surgery and how long? ASA & plavix  5. Practice name and name of physician performing surgery? Perlie Gold, DDS ([facility])  6. What is your office phone number [phone]    7.   What is your office fax number [phone]  8.   Anesthesia type (None, local, MAC, general) ? Not specified    Cassandra Santana 02/05/2019, 4:57 PM  _________________________________________________________________   (provider comments below)

## 2019-02-06 NOTE — Telephone Encounter (Signed)
Patient was recently seen by Dr. Gwenlyn Found as a virtual patient. She had DES to mid LCx with 70% ost RPDA and 40% mid RCA residual on 04/06/2018. More recently, she had LE angiography on 08/28/2018 with [facility] 1 directional atherectomy followed by drug coated balloon angioplasty of L SFA.   During the recent virtual visit, Dr. Gwenlyn Found mentioned: "She does have a dental abscess and needs tooth extraction.  She is on DAPT which we discussed about the risk of discontinuing with regards to acute stent thrombosis."  Dr. Gwenlyn Found, since patient is within 1 year from the DES to LCx and within 6 month from LE balloon angioplasty, would you be ok with holding plavix knowing higher risk of stent thrombosis? I have called and spoken with Cassandra Santana DDS, he is willing to proceed with the procedure on the aspirin if we allow her to hold the plavix.

## 2019-02-06 NOTE — Telephone Encounter (Signed)
   Primary Cardiologist: Quay Burow, MD  Chart reviewed as part of pre-operative protocol coverage. Simple dental extractions are considered low risk procedures per guidelines and generally do not require any specific cardiac clearance. It is also generally accepted that for simple extractions and dental cleanings, there is no need to interrupt blood thinner therapy.   SBE prophylaxis is not required for the patient.  I will route this recommendation to the requesting party via Epic fax function and remove from pre-op pool.  Please call with questions. Per Dr. Gwenlyn Found, she is close to 1 year out from stent placement, ok to hold plavix for 5 days prior to dental extraction, she is to continue aspirin through the procedure. I have called and informed the patient. She will need to resume plavix as soon as possible after the procedure.   [place], [place] 02/06/2019, 3:41 PM

## 2019-02-06 NOTE — Telephone Encounter (Signed)
Cassandra Santana, it is close to 12 months from her coronary intervention.  I am not so concerned about the peripheral intervention with regards to DAPT.  Okay to hold Plavix for tooth extraction if necessary they would prefer to wait until September.  If she needs to have this done in the near future I would be okay with that.

## 2019-02-15 ENCOUNTER — Telehealth: Payer: Self-pay

## 2019-02-15 NOTE — Telephone Encounter (Signed)
lmtcb regarding DMV paperwork pickup

## 2019-02-26 ENCOUNTER — Other Ambulatory Visit: Payer: Self-pay

## 2019-02-26 ENCOUNTER — Telehealth: Payer: Self-pay | Admitting: Cardiovascular Disease

## 2019-02-26 MED ORDER — LISINOPRIL 5 MG PO TABS: 5.0000 mg | ORAL_TABLET | Freq: Every day | ORAL | 3 refills | Status: DC

## 2019-02-26 NOTE — Telephone Encounter (Signed)
New message:    Patient calling she need a pre authorization. Please call patien.

## 2019-02-26 NOTE — Telephone Encounter (Signed)
Spoke with pt who states she wasn't able to get refill of her lisinopril 5mg  at pharmacy. 90 day supply Rx with 3 refills sent to [place] ([place]), [place]. Pt verbalized understanding

## 2019-03-28 ENCOUNTER — Other Ambulatory Visit: Payer: Self-pay | Admitting: Family Medicine

## 2019-03-28 DIAGNOSIS — Z1231 Encounter for screening mammogram for malignant neoplasm of breast: Secondary | ICD-10-CM

## 2019-05-07 ENCOUNTER — Other Ambulatory Visit: Payer: Self-pay | Admitting: Cardiovascular Disease

## 2019-05-07 MED ORDER — ATORVASTATIN CALCIUM 80 MG PO TABS: 80.0000 mg | ORAL_TABLET | Freq: Every day | ORAL | 2 refills | Status: DC

## 2019-05-07 NOTE — Telephone Encounter (Signed)
New Message   *STAT* If patient is at the pharmacy, call can be transferred to refill team.   1. Which medications need to be refilled? (please list name of each medication and dose if known) atorvastatin (LIPITOR) 80 MG tablet  2. Which pharmacy/location (including street and city if local pharmacy) is medication to be sent to? [place] ([place]), [place] [address]  3. Do they need a 30 day or 90 day supply? 90 day

## 2019-05-07 NOTE — Telephone Encounter (Signed)
Rx(s) sent to pharmacy electronically.  

## 2019-05-10 ENCOUNTER — Ambulatory Visit: Payer: Medicare HMO

## 2019-07-23 ENCOUNTER — Other Ambulatory Visit: Payer: Self-pay | Admitting: Cardiovascular Disease

## 2019-07-23 NOTE — Telephone Encounter (Signed)
New message    *STAT* If patient is at the pharmacy, call can be transferred to refill team.   1. Which medications need to be refilled? (please list name of each medication and dose if known) amLODipine (NORVASC) 5 MG tablet(Expired)  2. Which pharmacy/location (including street and city if local pharmacy) is medication to be sent to?[place] ([place]), [place] [address]  3. Do they need a 30 day or 90 day supply? [place]

## 2019-07-25 ENCOUNTER — Telehealth: Payer: Self-pay | Admitting: Cardiovascular Disease

## 2019-07-25 ENCOUNTER — Other Ambulatory Visit: Payer: Self-pay | Admitting: Cardiovascular Disease

## 2019-07-25 NOTE — Telephone Encounter (Signed)
New message   Patient needs a new prescription for amLODipine (NORVASC) 5 MG tablet(Expired) sent to [place] ([place]), [place] [address]

## 2019-07-25 NOTE — Telephone Encounter (Signed)
Spoke with patient. Patient informed she will need to make a f/u appt with Dr. Gwenlyn Found or an APP for further refills on her amlodipine. One month supply sent into patient's pharmacy. Patient verbalizes understanding and reports she will call to schedule an appointment on Monday.

## 2019-08-10 ENCOUNTER — Encounter (INDEPENDENT_AMBULATORY_CARE_PROVIDER_SITE_OTHER): Payer: Self-pay

## 2019-08-10 ENCOUNTER — Other Ambulatory Visit: Payer: Self-pay

## 2019-08-10 ENCOUNTER — Encounter: Payer: Self-pay | Admitting: Cardiovascular Disease

## 2019-08-10 ENCOUNTER — Ambulatory Visit: Payer: Medicare HMO | Admitting: Cardiovascular Disease

## 2019-08-10 DIAGNOSIS — Z720 Tobacco use: Secondary | ICD-10-CM | POA: Diagnosis not present

## 2019-08-10 DIAGNOSIS — I10 Essential (primary) hypertension: Secondary | ICD-10-CM

## 2019-08-10 DIAGNOSIS — I214 Non-ST elevation (NSTEMI) myocardial infarction: Secondary | ICD-10-CM | POA: Diagnosis not present

## 2019-08-10 DIAGNOSIS — I739 Peripheral vascular disease, unspecified: Secondary | ICD-10-CM | POA: Diagnosis not present

## 2019-08-10 DIAGNOSIS — E782 Mixed hyperlipidemia: Secondary | ICD-10-CM | POA: Diagnosis not present

## 2019-08-10 NOTE — Assessment & Plan Note (Signed)
History of ongoing tobacco abuse recalcitrant to factor modification.

## 2019-08-10 NOTE — Assessment & Plan Note (Signed)
History of PAD status post right SFA directional atherectomy followed by drug-coated balloon angioplasty for a CTO 08/28/2018.  She had three-vessel runoff bilaterally with an occluded right SFA which has not been revascularized although she is not symptomatic from this.  She continues to have left lower extremity discomfort.  Her last Dopplers performed 09/15/2018 revealed a left ABI of 0.92 with a patent left SFA.  She has poorly palpable pedal pulses on that side and continues to complain of claudication.  We will recheck lower extremity arterial Doppler studies.

## 2019-08-10 NOTE — Assessment & Plan Note (Signed)
History of hyperlipidemia on high-dose atorvastatin with lipid profile performed 07/19/2018 revealing total cholesterol of 116, LDL 55 and HDL 40.

## 2019-08-10 NOTE — Progress Notes (Signed)
08/10/2019 Cassandra Santana   Nov 12, 1950  [id number]  Primary Physician [name], DO Primary Cardiologist: [name] MD [name], [place], [place]  HPI:  Cassandra Santana is a 69 y.o.  mildly overweight divorced African-American female with no children who works as a Quarry manager.I last did a virtual telemedicine phone visit with her 12/22/2018. She had a recent non-STEMI 04/05/2018.She underwent radial diagnostic cath by Dr. Burt Knack the following day revealing a high-grade mid AV groove circumflex which underwent PCI and drug-eluting stenting using a synergy drug-eluting stent. She had mild disease in approximately the moderate disease of the PDA. EF was normal. She is placed on dual antiplatelet therapy including aspirin and Brilinta as well as high-dose statin therapy. The problems include treated hypertension although suboptimally and hyperlipidemia on high-dose statin therapy although she does complain of some myalgias related to this. Because of plaints of bilateral lower extremity pain she had Doppler studies performed 08/04/2018 revealing a right ABI of 0.71 and a left ABI 0.64. She had an occluded distal right SFA and an occluded left popliteal artery. Her symptoms are lifestyle limiting. She did stop smoking the day after her non-STEMI. She underwent peripheral angiography by myself 08/28/2018 documenting bilateral SFA CTO's with three-vessel runoff. She underwent [place] 1 directional atherectomy followed by drug-coated balloon angioplasty on the left SFA with an excellent angiographic result. Her claudication has resolved and her Dopplers performed 09/15/2018 have markedly improved.  She did change from Brilinta to Plavix which she said improved her claudication.  She does continue to smoke however.  She had lower extremity arterial Doppler studies performed 09/18/2018 which revealed a left ABI of 0.92 with a patent left SFA.  She otherwise denies chest pain or shortness of  breath.  She does continue to complain of left lower extremity claudication.   Current Meds  Medication Sig  . acetaminophen (TYLENOL) 500 MG tablet Take 1,000 mg by mouth daily as needed for moderate pain or headache.  Marland Kitchen amLODipine (NORVASC) 5 MG tablet Take 1 tablet (5 mg total) by mouth at bedtime. Appointment needed, no further refills until seen  . aspirin EC 81 MG EC tablet Take 1 tablet (81 mg total) by mouth daily.  Marland Kitchen atorvastatin (LIPITOR) 80 MG tablet Take 1 tablet (80 mg total) by mouth daily at 6 PM.  . clopidogrel (PLAVIX) 75 MG tablet Take 1 tablet (75 mg total) by mouth daily. When you finish your Brilinta, you may start taking this medication.   The first day you take your clopidogrel (Plavix), make sure you take 300 mg at once (4 tablets) as a "loading dose" to help get the amount of the drug to the appropriate therapeutic level in your body.  Take 1 tablet (75 mg) a day starting the day after and continue taking 75 mg a day from then on.  . lisinopril (ZESTRIL) 5 MG tablet Take 1 tablet (5 mg total) by mouth daily.  . Melatonin 5 MG CAPS Take 10 mg by mouth at bedtime as needed (sleep).  . nitroGLYCERIN (NITROSTAT) 0.4 MG SL tablet Place 1 tablet (0.4 mg total) under the tongue every 5 (five) minutes x 3 doses as needed for chest pain.     Allergies  Allergen Reactions  . Keflex [Cephalexin] Hives  . Tomato Hives and Itching    Social History   Socioeconomic History  . Marital status: Legally Separated    Spouse name: Not on file  . Number of children: Not on  file  . Years of education: Not on file  . Highest education level: Not on file  Occupational History  . Occupation: Forensic psychologist: [name]  Tobacco Use  . Smoking status: Former Smoker    Packs/day: 0.50    Types: Cigarettes    Quit date: 04/09/2018    Years since quitting: 1.3  . Smokeless tobacco: Never Used  Substance and Sexual Activity  . Alcohol use: No  . Drug use: Yes    Types:  Marijuana  . Sexual activity: Yes    Birth control/protection: Surgical  Other Topics Concern  . Not on file  Social History Narrative  . Not on file   Social Determinants of Health   Financial Resource Strain:   . Difficulty of Paying Living Expenses: Not on file  Food Insecurity:   . Worried About Charity fundraiser in the Last Year: Not on file  . Ran Out of Food in the Last Year: Not on file  Transportation Needs:   . Lack of Transportation (Medical): Not on file  . Lack of Transportation (Non-Medical): Not on file  Physical Activity:   . Days of Exercise per Week: Not on file  . Minutes of Exercise per Session: Not on file  Stress:   . Feeling of Stress : Not on file  Social Connections:   . Frequency of Communication with Friends and Family: Not on file  . Frequency of Social Gatherings with Friends and Family: Not on file  . Attends Religious Services: Not on file  . Active Member of Clubs or Organizations: Not on file  . Attends Archivist Meetings: Not on file  . Marital Status: Not on file  Intimate Partner Violence:   . Fear of Current or Ex-Partner: Not on file  . Emotionally Abused: Not on file  . Physically Abused: Not on file  . Sexually Abused: Not on file     Review of Systems: General: negative for chills, fever, night sweats or weight changes.  Cardiovascular: negative for chest pain, dyspnea on exertion, edema, orthopnea, palpitations, paroxysmal nocturnal dyspnea or shortness of breath Dermatological: negative for rash Respiratory: negative for cough or wheezing Urologic: negative for hematuria Abdominal: negative for nausea, vomiting, diarrhea, bright red blood per rectum, melena, or hematemesis Neurologic: negative for visual changes, syncope, or dizziness All other systems reviewed and are otherwise negative except as noted above.    Blood pressure 112/60, pulse 67, temperature (!) 97.5 F (36.4 C), height 5\' 3"  (1.6 m), weight 134  lb (60.8 kg), SpO2 99 %.  General appearance: alert and no distress Neck: no adenopathy, no JVD, supple, symmetrical, trachea midline, thyroid not enlarged, symmetric, no tenderness/mass/nodules and Left carotid bruit Lungs: clear to auscultation bilaterally Heart: regular rate and rhythm, S1, S2 normal, no murmur, click, rub or gallop Extremities: extremities normal, atraumatic, no cyanosis or edema Pulses: 2+ and symmetric Skin: Skin color, texture, turgor normal. No rashes or lesions Neurologic: Alert and oriented X 3, normal strength and tone. Normal symmetric reflexes. Normal coordination and gait  EKG sinus rhythm at 67 without ST or T wave changes.  I personally reviewed this EKG.  ASSESSMENT AND PLAN:   NSTEMI (non-ST elevated myocardial infarction) ([place]) History of CAD status post non-STEMI 04/05/2018.  She underwent right radial diagnostic cath by Dr. Burt Knack the following day revealing high-grade mid AV groove circumflex which he ultimately stented with a synergy drug-eluting stent.  She had moderate disease in other vessels  with normal EF.  She was placed on dual antiplatelet therapy including aspirin and Brilinta which was ultimately transitioned to Plavix.  She denies chest pain or shortness of breath.  Hypertension History essential potential blood pressure measured at 151/78.  She is on amlodipine and lisinopril.  Hyperlipidemia History of hyperlipidemia on high-dose atorvastatin with lipid profile performed 07/19/2018 revealing total cholesterol of 116, LDL 55 and HDL 40.  Tobacco use History of ongoing tobacco abuse recalcitrant to factor modification.  Peripheral arterial disease (HCC) History of PAD status post right SFA directional atherectomy followed by drug-coated balloon angioplasty for a CTO 08/28/2018.  She had three-vessel runoff bilaterally with an occluded right SFA which has not been revascularized although she is not symptomatic from this.  She continues to have  left lower extremity discomfort.  Her last Dopplers performed 09/15/2018 revealed a left ABI of 0.92 with a patent left SFA.  She has poorly palpable pedal pulses on that side and continues to complain of claudication.  We will recheck lower extremity arterial Doppler studies.      [name] MD FACP,FACC,FAHA, [facility] 08/10/2019 2:21 PM

## 2019-08-10 NOTE — Assessment & Plan Note (Signed)
History essential potential blood pressure measured at 151/78.  She is on amlodipine and lisinopril.

## 2019-08-10 NOTE — Patient Instructions (Signed)
Medication Instructions:  Your physician recommends that you continue on your current medications as directed. Please refer to the Current Medication list given to you today.  If you need a refill on your cardiac medications before your next appointment, please call your pharmacy.   Lab work: Fasting Lipids and Hepatic Function with CBC If you have labs (blood work) drawn today and your tests are completely normal, you will receive your results only by: [place] (if you have MyChart) OR A paper copy in the mail If you have any lab test that is abnormal or we need to change your treatment, we will call you to review the results.  Testing/Procedures: Your physician has requested that you have a lower extremity arterial exercise duplex. During this test, exercise and ultrasound are used to evaluate arterial blood flow in the legs. Allow one hour for this exam. There are no restrictions or special instructions.  AND  Your physician has requested that you have an ankle brachial index (ABI). During this test an ultrasound and blood pressure cuff are used to evaluate the arteries that supply the arms and legs with blood. Allow thirty minutes for this exam. There are no restrictions or special instructions.  Follow-Up: At [facility], you and your health needs are our priority.  As part of our continuing mission to provide you with exceptional heart care, we have created designated Provider Care Teams.  These Care Teams include your primary Cardiologist (physician) and Advanced Practice Providers (APPs -  Physician Assistants and Nurse Practitioners) who all work together to provide you with the care you need, when you need it. You may see Quay Burow, MD or one of the following Advanced Practice Providers on your designated Care Team:    Kerin Ransom, PA-C  [place], [place]  Coletta Memos, [place]  Your physician wants you to follow-up in: 1 year with Dr Gwenlyn Found

## 2019-08-10 NOTE — Assessment & Plan Note (Signed)
History of CAD status post non-STEMI 04/05/2018.  She underwent right radial diagnostic cath by Dr. Burt Knack the following day revealing high-grade mid AV groove circumflex which he ultimately stented with a synergy drug-eluting stent.  She had moderate disease in other vessels with normal EF.  She was placed on dual antiplatelet therapy including aspirin and Brilinta which was ultimately transitioned to Plavix.  She denies chest pain or shortness of breath.

## 2019-08-10 NOTE — Addendum Note (Signed)
Addended by: Cain Sieve on: 08/10/2019 02:27 PM   Modules accepted: Orders

## 2019-08-14 ENCOUNTER — Encounter (HOSPITAL_COMMUNITY): Payer: Medicare HMO

## 2019-08-14 DIAGNOSIS — Z720 Tobacco use: Secondary | ICD-10-CM | POA: Diagnosis not present

## 2019-08-14 DIAGNOSIS — I739 Peripheral vascular disease, unspecified: Secondary | ICD-10-CM | POA: Diagnosis not present

## 2019-08-14 DIAGNOSIS — E782 Mixed hyperlipidemia: Secondary | ICD-10-CM | POA: Diagnosis not present

## 2019-08-14 DIAGNOSIS — I10 Essential (primary) hypertension: Secondary | ICD-10-CM | POA: Diagnosis not present

## 2019-08-14 DIAGNOSIS — I214 Non-ST elevation (NSTEMI) myocardial infarction: Secondary | ICD-10-CM | POA: Diagnosis not present

## 2019-08-15 LAB — HEPATIC FUNCTION PANEL
ALT: 9 IU/L (ref 0–32)
AST: 14 IU/L (ref 0–40)
Albumin: 4.1 g/dL (ref 3.8–4.8)
Alkaline Phosphatase: 124 IU/L — ABNORMAL HIGH (ref 39–117)
Bilirubin Total: 0.3 mg/dL (ref 0.0–1.2)
Bilirubin, Direct: 0.1 mg/dL (ref 0.00–0.40)
Total Protein: 7.1 g/dL (ref 6.0–8.5)

## 2019-08-15 LAB — LIPID PANEL
Chol/HDL Ratio: 3 ratio (ref 0.0–4.4)
Cholesterol, Total: 133 mg/dL (ref 100–199)
HDL: 44 mg/dL (ref >39)
LDL Chol Calc (NIH): 65 mg/dL (ref 0–99)
Triglycerides: 135 mg/dL (ref 0–149)
VLDL Cholesterol Cal: 24 mg/dL (ref 5–40)

## 2019-08-15 LAB — CBC
Hematocrit: 29.2 % — ABNORMAL LOW (ref 34.0–46.6)
Hemoglobin: 8 g/dL — ABNORMAL LOW (ref 11.1–15.9)
MCH: 19.9 pg — ABNORMAL LOW (ref 26.6–33.0)
MCHC: 27.4 g/dL — ABNORMAL LOW (ref 31.5–35.7)
MCV: 73 fL — ABNORMAL LOW (ref 79–97)
Platelets: 411 10*3/uL (ref 150–450)
RBC: 4.02 x10E6/uL (ref 3.77–5.28)
RDW: 17.5 % — ABNORMAL HIGH (ref 11.7–15.4)
WBC: 9.4 10*3/uL (ref 3.4–10.8)

## 2019-08-27 ENCOUNTER — Other Ambulatory Visit: Payer: Self-pay | Admitting: Cardiovascular Disease

## 2019-08-27 NOTE — Telephone Encounter (Signed)
*  STAT* If patient is at the pharmacy, call can be transferred to refill team.   1. Which medications need to be refilled? (please list name of each medication and dose if known)  amLODipine (NORVASC) 5 MG tablet  2. Which pharmacy/location (including street and city if local pharmacy) is medication to be sent to? [place] ([place]), [place] [address]  3. Do they need a 30 day or 90 day supply? 30  Patient has one tablet left, she saw Dr. Gwenlyn Found on 08/10/19.

## 2019-08-28 MED ORDER — AMLODIPINE BESYLATE 5 MG PO TABS: 5.0000 mg | ORAL_TABLET | Freq: Every day | ORAL | 3 refills | Status: DC

## 2019-09-18 ENCOUNTER — Encounter (HOSPITAL_COMMUNITY): Payer: Medicare HMO

## 2019-09-19 ENCOUNTER — Ambulatory Visit (HOSPITAL_COMMUNITY)
Admission: RE | Admit: 2019-09-19 | Discharge: 2019-09-19 | Disposition: A | Discharge status: Home / Self Care | Payer: Medicare HMO | Source: Ambulatory Visit | Attending: Cardiology | Admitting: Cardiology

## 2019-09-19 ENCOUNTER — Other Ambulatory Visit: Payer: Self-pay | Admitting: Cardiovascular Disease

## 2019-09-19 ENCOUNTER — Ambulatory Visit (HOSPITAL_BASED_OUTPATIENT_CLINIC_OR_DEPARTMENT_OTHER)
Admission: RE | Admit: 2019-09-19 | Discharge: 2019-09-19 | Disposition: A | Discharge status: Home / Self Care | Payer: Medicare HMO | Source: Ambulatory Visit | Attending: Cardiovascular Disease | Admitting: Cardiovascular Disease

## 2019-09-19 ENCOUNTER — Other Ambulatory Visit: Payer: Self-pay

## 2019-09-19 DIAGNOSIS — Z9862 Peripheral vascular angioplasty status: Secondary | ICD-10-CM

## 2019-09-19 DIAGNOSIS — I739 Peripheral vascular disease, unspecified: Secondary | ICD-10-CM | POA: Insufficient documentation

## 2019-09-19 DIAGNOSIS — R0989 Other specified symptoms and signs involving the circulatory and respiratory systems: Secondary | ICD-10-CM | POA: Diagnosis not present

## 2019-09-20 ENCOUNTER — Telehealth: Payer: Self-pay

## 2019-09-20 NOTE — Telephone Encounter (Signed)
LM2CB for results

## 2019-09-20 NOTE — Telephone Encounter (Signed)
-----   Message from [name], MD sent at 09/20/2019 10:42 AM EST ----- Mild renarrowing within the left SFA but no change since prior study.  Repeat 12 months

## 2019-09-21 DIAGNOSIS — I739 Peripheral vascular disease, unspecified: Secondary | ICD-10-CM

## 2019-10-16 DIAGNOSIS — U071 COVID-19: Secondary | ICD-10-CM | POA: Diagnosis not present

## 2019-10-17 DIAGNOSIS — I2510 Atherosclerotic heart disease of native coronary artery without angina pectoris: Secondary | ICD-10-CM | POA: Diagnosis not present

## 2019-10-17 DIAGNOSIS — Z79899 Other long term (current) drug therapy: Secondary | ICD-10-CM | POA: Diagnosis not present

## 2019-10-17 DIAGNOSIS — Z0000 Encounter for general adult medical examination without abnormal findings: Secondary | ICD-10-CM | POA: Diagnosis not present

## 2019-10-22 DIAGNOSIS — F17200 Nicotine dependence, unspecified, uncomplicated: Secondary | ICD-10-CM | POA: Diagnosis not present

## 2019-10-22 DIAGNOSIS — Z1211 Encounter for screening for malignant neoplasm of colon: Secondary | ICD-10-CM | POA: Diagnosis not present

## 2019-10-22 DIAGNOSIS — Z0000 Encounter for general adult medical examination without abnormal findings: Secondary | ICD-10-CM | POA: Diagnosis not present

## 2019-10-22 DIAGNOSIS — R7309 Other abnormal glucose: Secondary | ICD-10-CM | POA: Diagnosis not present

## 2019-10-22 DIAGNOSIS — I2510 Atherosclerotic heart disease of native coronary artery without angina pectoris: Secondary | ICD-10-CM | POA: Diagnosis not present

## 2019-10-22 DIAGNOSIS — I739 Peripheral vascular disease, unspecified: Secondary | ICD-10-CM | POA: Diagnosis not present

## 2019-10-22 DIAGNOSIS — D649 Anemia, unspecified: Secondary | ICD-10-CM | POA: Diagnosis not present

## 2019-10-22 DIAGNOSIS — K130 Diseases of lips: Secondary | ICD-10-CM | POA: Diagnosis not present

## 2019-11-02 ENCOUNTER — Other Ambulatory Visit: Payer: Self-pay | Admitting: Cardiovascular Disease

## 2019-11-02 ENCOUNTER — Telehealth: Payer: Self-pay | Admitting: Cardiovascular Disease

## 2019-11-02 NOTE — Telephone Encounter (Signed)
*  STAT* If patient is at the pharmacy, call can be transferred to refill team.   1. Which medications need to be refilled? (please list name of each medication and dose if known) clopidogrel (PLAVIX) 75 MG tablet  2. Which pharmacy/location (including street and city if local pharmacy) is medication to be sent to? [id number] - [place] ([place]), [place] - [address]  3. Do they need a 30 day or 90 day supply? 90  

## 2019-11-28 ENCOUNTER — Encounter: Payer: Self-pay | Admitting: Internal Medicine

## 2019-11-28 ENCOUNTER — Telehealth: Payer: Self-pay

## 2019-11-28 ENCOUNTER — Ambulatory Visit: Payer: Medicare HMO | Admitting: Internal Medicine

## 2019-11-28 VITALS — BP 122/60 | HR 77 | Temp 97.5°F | Ht 63.0 in | Wt 138.6 lb

## 2019-11-28 DIAGNOSIS — D509 Iron deficiency anemia, unspecified: Secondary | ICD-10-CM

## 2019-11-28 DIAGNOSIS — Z01818 Encounter for other preprocedural examination: Secondary | ICD-10-CM

## 2019-11-28 DIAGNOSIS — Z86010 Personal history of colonic polyps: Secondary | ICD-10-CM

## 2019-11-28 DIAGNOSIS — Z7902 Long term (current) use of antithrombotics/antiplatelets: Secondary | ICD-10-CM

## 2019-11-28 MED ORDER — NA SULFATE-K SULFATE-MG SULF 17.5-3.13-1.6 GM/177ML PO SOLN: 1.0000 | Freq: Once | ORAL | 0 refills | Status: AC

## 2019-11-28 NOTE — Progress Notes (Signed)
HISTORY OF PRESENT ILLNESS:  Cassandra Santana is a 69 y.o. 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  She is sent today by her primary care provider Dr. Theda Santana regarding iron deficiency anemia.  The patient's myocardial infarction occurred September 2019.  Hemoglobin at that time was 13.5 with MCV 89.  Subsequent hemoglobin January 2020 was 8.8 with MCV 83.  In January 2021 hemoglobin was 8.0 with MCV 73.  Patient was offered iron infusion.  She declined.  Has been taking oral iron once daily.  This upsets her stomach.  She is not on prophylactic PPI therapy.  Most recent blood counts with Dr. Theda Santana March 2021 revealed hemoglobin 8.4 with MCV 66.  Patient denies melena or hematochezia.  She denies NSAID use, other than aspirin.  She does have bloating and gas.  No dysphagia.  No abdominal pain.  No weight loss.  She is not a blood donor.  She continues to smoke.  She has not had her Covid vaccination.  She has come off Plavix for dental work.  She sees Dr. Gwenlyn Santana for her cardiac care.  She has normal renal function.  She does have a history of adenomatous colon polyps and underwent colonoscopy July 2014.  She was Santana to have 1 diminutive adenoma.  Otherwise normal exam.  Follow-up in 5 years recommended.  REVIEW OF SYSTEMS:  All non-GI ROS negative unless otherwise stated in the HPI except for sinus and allergies, sore throat  Past Medical History:  Diagnosis Date  . Allergies   . Anemia   . CAD (coronary artery disease)   . Colitis   . Colon polyp   . High cholesterol   . HTN (hypertension)   . Hyperlipidemia   . Hypertension   . Internal hemorrhoids   . NSTEMI (non-ST elevated myocardial infarction) ([place]) 04/2018  . PAD (peripheral artery  disease) ([employer])   . Rectal polyp    hyperplastic    Past Surgical History:  Procedure Laterality Date  . ABDOMINAL AORTOGRAM W/LOWER EXTREMITY Bilateral 08/28/2018   Procedure: ABDOMINAL AORTOGRAM W/LOWER EXTREMITY;  Surgeon: Cassandra Harp, MD;  Location: [facility];  Service: Cardiovascular;  Laterality: Bilateral;  . ABDOMINAL HYSTERECTOMY  1993  . CORONARY STENT INTERVENTION N/A 04/06/2018   Procedure: CORONARY STENT INTERVENTION;  Surgeon: Cassandra Mocha, MD;  Location: [facility];  Service: Cardiovascular;  Laterality: N/A;  . LEFT HEART CATH AND CORONARY ANGIOGRAPHY N/A 04/06/2018   Procedure: LEFT HEART CATH AND CORONARY ANGIOGRAPHY;  Surgeon: Cassandra Mocha, MD;  Location: [facility];  Service: Cardiovascular;  Laterality: N/A;  . PERIPHERAL VASCULAR ATHERECTOMY Left 08/28/2018   Procedure: PERIPHERAL VASCULAR ATHERECTOMY;  Surgeon: Cassandra Harp, MD;  Location: [facility];  Service: Cardiovascular;  Laterality: Left;  SFA  . ROTATOR CUFF REPAIR Right     Social History Cassandra Santana  reports that she has been smoking cigarettes. She has been smoking about 0.50 packs per day. She has never used smokeless tobacco. She reports current drug use. Drug: Marijuana. She reports that she does not drink alcohol.  family history includes Diabetes in her mother and sister; Heart attack in her father; Heart disease in her father, mother, and sister; Kidney failure in her mother; Pancreatic cancer in her maternal grandmother.  Allergies  Allergen Reactions  .  Keflex [Cephalexin] Hives  . Tomato Hives and Itching       PHYSICAL EXAMINATION: Vital signs: BP 122/60   Pulse 77   Temp (!) 97.5 F (36.4 C)   Ht 5\' 3"  (1.6 m)   Wt 138 lb 9.6 oz (62.9 kg)   BMI 24.55 kg/m   Constitutional: generally well-appearing, no acute distress Psychiatric: alert and oriented x3, cooperative Eyes: extraocular movements intact, anicteric, conjunctiva  pink Mouth: oral pharynx moist, no lesions Neck: supple no lymphadenopathy Cardiovascular: heart regular rate and rhythm, no murmur Lungs: clear to auscultation bilaterally Abdomen: soft, nontender, nondistended, no obvious ascites, no peritoneal signs, normal bowel sounds, no organomegaly Rectal: Deferred until colonoscopy Extremities: no clubbing, cyanosis, or lower extremity edema bilaterally Skin: no lesions on visible extremities Neuro: No focal deficits.  Cranial nerves intact  ASSESSMENT:  1.  Iron deficiency anemia.  Rule out underlying GI mucosal abnormality 2.  Multiple medical problems including coronary artery disease.  On aspirin and Plavix 3.  Chronic tobacco abuse.  Ongoing 4.  History of adenomatous colon polyp July 2014.  Due for follow-up 5.  Has not been vaccinated against Covid.  High risk patient   PLAN:  1.  Schedule colonoscopy to evaluate iron deficiency anemia and provide neoplasia surveillance.  The patient is HIGH RISK given her comorbidities and the need to address her Plavix therapy.The nature of the procedure, as well as the risks, benefits, and alternatives were carefully and thoroughly reviewed with the patient. Ample time for discussion and questions allowed. The patient understood, was satisfied, and agreed to proceed. 2.  Schedule upper endoscopy to evaluate iron deficiency anemia.  High risk patient as above.The nature of the procedure, as well as the risks, benefits, and alternatives were carefully and thoroughly reviewed with the patient. Ample time for discussion and questions allowed. The patient understood, was satisfied, and agreed to proceed. 3.  Continue iron. 4.  May need prophylactic PPI 5.  Encouraged to get her Covid vaccination 6.  Would like to hold Plavix for 1 week prior to her procedures.  Would continue on aspirin throughout.  We will check with her cardiologist to see if this is acceptable.  A total time of 60 minutes was spent  preparing to see the patient, reviewing outside laboratory test, obtaining comprehensive history, reviewing outside history, performing comprehensive physical examination, counseling the patient regarding the above listed issues, ordering advanced procedures, discussing her high risk nature and the need to address her Plavix therapy, conferring with her cardiologist, and documenting clinical information in the health record.

## 2019-11-28 NOTE — Patient Instructions (Addendum)
You have been scheduled for an endoscopy and colonoscopy. Please follow the written instructions given to you at your visit today. Please pick up your prep supplies at the pharmacy within the next 1-3 days. If you use inhalers (even only as needed), please bring them with you on the day of your procedure.  

## 2019-11-28 NOTE — Telephone Encounter (Signed)
   Primary Cardiologist: Quay Burow, MD  Chart reviewed as part of pre-operative protocol coverage. Patient was contacted 11/28/2019 in reference to pre-operative risk assessment for pending surgery as outlined below.  Cassandra Santana was last seen on 08/10/19 by Dr. Gwenlyn Found. She has a history of CAD with NSTEMI 04/2018 s/p DES to Cx, HTN, HLD, PAD s/p lower extremity intervention 08/2018, tobacco abuse.   She saw GI with progressive iron deficiency anemia. Plan is for endoscopy/colonoscopy which isn't scheduled until end of July. Pt's hemoglobin was 13.1 in 2019, 10.4 in 08/2018, 8.8 in 08/2019 and 8.0 in 08/2019. I see in a prior clearance that Dr. Gwenlyn Found had cleared patient to come off Plavix as needed for dental work which we can extrapolate to this colonoscopy, but I also will route to Dr. Gwenlyn Found to make him aware of her progressive anemia in case there was ever a consideration of transitioning DAPT to only using one antiplatelet agent. Cath report from 04/2018 recommended DAPT for 1 year, and then she went onto have PAD intervention in 08/2018.  Dr. Gwenlyn Found -- Please route response to [facility] PREOP (the pre-op pool). Thank you.  Charlie Pitter, PA-C 11/28/2019, 3:42 PM

## 2019-11-28 NOTE — Telephone Encounter (Signed)
[facility] Pre-operative Risk Assessment     Request for surgical clearance:     Endoscopy Procedure  What type of surgery is being performed?     Endo/colon  When is this surgery scheduled?     02/26/2020  What type of clearance is required ?   Pharmacy  Are there any medications that need to be held prior to surgery and how long? Plavix - 1 week  Practice name and name of physician performing surgery?      [facility]  What is your office phone and fax number?      Phone- [phone]  [phone]  Anesthesia type (None, local, MAC, general) ?       MAC

## 2019-11-29 NOTE — Telephone Encounter (Signed)
   Primary Cardiologist: Quay Burow, MD  Chart reviewed as part of pre-operative protocol coverage.   Per Dr. Gwenlyn Found, patient can hold plavix 1 week prior to her upcoming colonoscopy so long as there is no interval change in her cardiac history prior to her procedure. She should restart plavix when cleared to do so by her gastroenterologist.   I will route this recommendation to the requesting party via [facility] fax function and remove from pre-op pool.  Please call with questions.  Abigail Butts, PA-C 11/29/2019, 10:13 AM

## 2019-11-29 NOTE — Telephone Encounter (Signed)
Okay to interrupt antiplatelet therapy for colonoscopy

## 2019-11-30 ENCOUNTER — Telehealth: Payer: Self-pay

## 2019-11-30 NOTE — Telephone Encounter (Signed)
Lm on vm that patient could hold her Plavix for 1 week prior to her procedure.  Requested that patient call and leave a message that she received and understood this information.

## 2019-12-03 NOTE — Telephone Encounter (Signed)
Patient communicated she understood information regarding her Plavix

## 2019-12-03 NOTE — Telephone Encounter (Signed)
Patient called confirmed message understood

## 2020-01-02 ENCOUNTER — Telehealth: Payer: Self-pay

## 2020-01-02 NOTE — Telephone Encounter (Signed)
Spoke to patient to let her know I had rescheduled her covid testing before her procedure on 02/26/2020 to 02/22/2020 at 2:00pm.  Patient agreed

## 2020-01-14 ENCOUNTER — Encounter: Payer: Medicare HMO | Admitting: Internal Medicine

## 2020-02-03 ENCOUNTER — Other Ambulatory Visit: Payer: Self-pay | Admitting: Cardiovascular Disease

## 2020-02-07 ENCOUNTER — Other Ambulatory Visit: Payer: Self-pay | Admitting: Cardiovascular Disease

## 2020-02-07 MED ORDER — ATORVASTATIN CALCIUM 80 MG PO TABS: 80.0000 mg | ORAL_TABLET | Freq: Every day | ORAL | 2 refills | Status: DC

## 2020-02-07 NOTE — Telephone Encounter (Signed)
Refill for Lipitor sent to pharmacy.

## 2020-02-07 NOTE — Telephone Encounter (Signed)
*  STAT* If patient is at the pharmacy, call can be transferred to refill team.   1. Which medications need to be refilled? (please list name of each medication and dose if known)  atorvastatin (LIPITOR) 80 MG tablet  2. Which pharmacy/location (including street and city if local pharmacy) is medication to be sent to?  [place] ([place]),  - [address]  3. Do they need a 30 day or 90 day supply? 75  Pt said her bottle "requires authorization" for refills. Pt only has 4 days of medicine left

## 2020-02-18 ENCOUNTER — Encounter: Payer: Self-pay | Admitting: Internal Medicine

## 2020-02-26 ENCOUNTER — Encounter: Payer: Self-pay | Admitting: Internal Medicine

## 2020-02-26 ENCOUNTER — Other Ambulatory Visit: Payer: Self-pay

## 2020-02-26 ENCOUNTER — Ambulatory Visit (AMBULATORY_SURGERY_CENTER): Payer: Medicare HMO | Admitting: Internal Medicine

## 2020-02-26 ENCOUNTER — Other Ambulatory Visit (INDEPENDENT_AMBULATORY_CARE_PROVIDER_SITE_OTHER): Payer: Medicare HMO

## 2020-02-26 VITALS — BP 138/64 | HR 75 | Temp 97.3°F | Resp 17 | Ht 63.0 in | Wt 138.0 lb

## 2020-02-26 DIAGNOSIS — K649 Unspecified hemorrhoids: Secondary | ICD-10-CM | POA: Diagnosis not present

## 2020-02-26 DIAGNOSIS — D508 Other iron deficiency anemias: Secondary | ICD-10-CM | POA: Diagnosis not present

## 2020-02-26 DIAGNOSIS — D120 Benign neoplasm of cecum: Secondary | ICD-10-CM

## 2020-02-26 DIAGNOSIS — D509 Iron deficiency anemia, unspecified: Secondary | ICD-10-CM | POA: Diagnosis not present

## 2020-02-26 DIAGNOSIS — Z86010 Personal history of colonic polyps: Secondary | ICD-10-CM | POA: Diagnosis not present

## 2020-02-26 DIAGNOSIS — K2980 Duodenitis without bleeding: Secondary | ICD-10-CM

## 2020-02-26 DIAGNOSIS — I10 Essential (primary) hypertension: Secondary | ICD-10-CM | POA: Diagnosis not present

## 2020-02-26 DIAGNOSIS — I2510 Atherosclerotic heart disease of native coronary artery without angina pectoris: Secondary | ICD-10-CM | POA: Diagnosis not present

## 2020-02-26 DIAGNOSIS — I252 Old myocardial infarction: Secondary | ICD-10-CM | POA: Diagnosis not present

## 2020-02-26 DIAGNOSIS — D122 Benign neoplasm of ascending colon: Secondary | ICD-10-CM | POA: Diagnosis not present

## 2020-02-26 DIAGNOSIS — D123 Benign neoplasm of transverse colon: Secondary | ICD-10-CM | POA: Diagnosis not present

## 2020-02-26 LAB — CBC WITH DIFFERENTIAL/PLATELET
Basophils Absolute: 0.2 10*3/uL — ABNORMAL HIGH (ref 0.0–0.1)
Basophils Relative: 3.5 % — ABNORMAL HIGH (ref 0.0–3.0)
Eosinophils Absolute: 0.2 10*3/uL (ref 0.0–0.7)
Eosinophils Relative: 3.3 % (ref 0.0–5.0)
HCT: 26.3 % — ABNORMAL LOW (ref 36.0–46.0)
Hemoglobin: 7.7 g/dL — CL (ref 12.0–15.0)
Lymphocytes Relative: 28.2 % (ref 12.0–46.0)
Lymphs Abs: 1.7 10*3/uL (ref 0.7–4.0)
MCHC: 29.1 g/dL — ABNORMAL LOW (ref 30.0–36.0)
MCV: 64.3 fl — ABNORMAL LOW (ref 78.0–100.0)
Monocytes Absolute: 0.6 10*3/uL (ref 0.1–1.0)
Monocytes Relative: 9.4 % (ref 3.0–12.0)
Neutro Abs: 3.5 10*3/uL (ref 1.4–7.7)
Neutrophils Relative %: 55.6 % (ref 43.0–77.0)
Platelets: 378 10*3/uL (ref 150.0–400.0)
RBC: 4.09 Mil/uL (ref 3.87–5.11)
RDW: 20 % — ABNORMAL HIGH (ref 11.5–15.5)
WBC: 6.2 10*3/uL (ref 4.0–10.5)

## 2020-02-26 LAB — FERRITIN: Ferritin: 5.9 ng/mL — ABNORMAL LOW (ref 10.0–291.0)

## 2020-02-26 MED ORDER — SODIUM CHLORIDE 0.9 % IV SOLN: 500.0000 mL | Freq: Once | INTRAVENOUS | Status: DC

## 2020-02-26 MED ORDER — PANTOPRAZOLE SODIUM 40 MG PO TBEC: 40.0000 mg | DELAYED_RELEASE_TABLET | Freq: Every day | ORAL | 3 refills | Status: DC

## 2020-02-26 NOTE — Patient Instructions (Signed)
START taking pantoprozole 40 mg Daily.  RESUME Plavix tomorrow at prior dose.   YOU HAD AN ENDOSCOPIC PROCEDURE TODAY AT [facility]:   Refer to the procedure report that was given to you for any specific questions about what was found during the examination.  If the procedure report does not answer your questions, please call your gastroenterologist to clarify.  If you requested that your care partner not be given the details of your procedure findings, then the procedure report has been included in a sealed envelope for you to review at your convenience later.  YOU SHOULD EXPECT: Some feelings of bloating in the abdomen. Passage of more gas than usual.  Walking can help get rid of the air that was put into your GI tract during the procedure and reduce the bloating. If you had a lower endoscopy (such as a colonoscopy or flexible sigmoidoscopy) you may notice spotting of blood in your stool or on the toilet paper. If you underwent a bowel prep for your procedure, you may not have a normal bowel movement for a few days.  Please Note:  You might notice some irritation and congestion in your nose or some drainage.  This is from the oxygen used during your procedure.  There is no need for concern and it should clear up in a day or so.  SYMPTOMS TO REPORT IMMEDIATELY:   Following lower endoscopy (colonoscopy or flexible sigmoidoscopy):  Excessive amounts of blood in the stool  Significant tenderness or worsening of abdominal pains  Swelling of the abdomen that is new, acute  Fever of 100F or higher   Following upper endoscopy (EGD)  Vomiting of blood or coffee ground material  New chest pain or pain under the shoulder blades  Painful or persistently difficult swallowing  New shortness of breath  Fever of 100F or higher  Black, tarry-looking stools  For urgent or emergent issues, a gastroenterologist can be reached at any hour by calling [phone]. Do not use MyChart  messaging for urgent concerns.    DIET:  We do recommend a small meal at first, but then you may proceed to your regular diet.  Drink plenty of fluids but you should avoid alcoholic beverages for 24 hours.  ACTIVITY:  You should plan to take it easy for the rest of today and you should NOT DRIVE or use heavy machinery until tomorrow (because of the sedation medicines used during the test).    FOLLOW UP: Our staff will call the number listed on your records 48-72 hours following your procedure to check on you and address any questions or concerns that you may have regarding the information given to you following your procedure. If we do not reach you, we will leave a message.  We will attempt to reach you two times.  During this call, we will ask if you have developed any symptoms of COVID 19. If you develop any symptoms (ie: fever, flu-like symptoms, shortness of breath, cough etc.) before then, please call [phone].  If you test positive for Covid 19 in the 2 weeks post procedure, please call and report this information to [place].    If any biopsies were taken you will be contacted by phone or by letter within the next 1-3 weeks.  Please call us at [phone] if you have not heard about the biopsies in 3 weeks.    SIGNATURES/CONFIDENTIALITY: You and/or your care partner have signed paperwork which will be entered into your electronic  medical record.  These signatures attest to the fact that that the information above on your After Visit Summary has been reviewed and is understood.  Full responsibility of the confidentiality of this discharge information lies with you and/or your care-partner.

## 2020-02-26 NOTE — Op Note (Signed)
[facility] Patient Name: Cassandra Santana Procedure Date: 02/26/2020 1:35 PM MRN: [id number] Endoscopist: Docia Chuck. Henrene Pastor , MD Age: 69 Referring MD:  Date of Birth: Jul 17, 1951 Gender: Female Account #: [id number] Procedure:                Upper GI endoscopy with biopsies Indications:              Iron deficiency anemia Medicines:                Monitored Anesthesia Care Procedure:                Pre-Anesthesia Assessment:                           - Prior to the procedure, a History and Physical                            was performed, and patient medications and                            allergies were reviewed. The patient's tolerance of                            previous anesthesia was also reviewed. The risks                            and benefits of the procedure and the sedation                            options and risks were discussed with the patient.                            All questions were answered, and informed consent                            was obtained. Prior Anticoagulants: The patient has                            taken Plavix (clopidogrel), last dose was 8 days                            prior to procedure. ASA Grade Assessment: III - A                            patient with severe systemic disease. After                            reviewing the risks and benefits, the patient was                            deemed in satisfactory condition to undergo the                            procedure.  After obtaining informed consent, the endoscope was                            passed under direct vision. Throughout the                            procedure, the patient's blood pressure, pulse, and                            oxygen saturations were monitored continuously. The                            Endoscope was introduced through the mouth, and                            advanced to the second part of duodenum. The upper                             GI endoscopy was accomplished without difficulty.                            The patient tolerated the procedure well. Scope In: Scope Out: Findings:                 The esophagus was normal.                           The stomach was normal.                           The the duodenal bulb revealed multiple erosions.                            The post bulbar duodenum was normal. This was                            biopsied with a cold forceps from the gastric                            antrum for histology to rule out Helicobacter                            pylori.                           The cardia and gastric fundus were normal on                            retroflexion. Complications:            No immediate complications. Estimated Blood Loss:     Estimated blood loss: none. Impression:               1. Erosive duodenitis                           2. Otherwise unremarkable EGD  status post biopsy to                            rule out Helicobacter pylori                           3. Iron deficiency anemia. Recommendation:           1. Prescribe pantoprazole 40 mg daily; #30; 11                            refills                           2. Resume daily iron therapy                           3. Go to the laboratory today for CBC and ferritin                            level                           4. Resume Plavix (clopidogrel) at prior dose                            tomorrow.                           5. Office follow-up with Dr. Henrene Pastor in 4 to 6 weeks Docia Chuck. Henrene Pastor, MD 02/26/2020 2:33:34 PM This report has been signed electronically.

## 2020-02-26 NOTE — Progress Notes (Signed)
V/S-CW  Check-in-JB

## 2020-02-26 NOTE — Progress Notes (Signed)
Report to PACU, RN, vss, pt on 2L O2. BS as pre op (smoker)

## 2020-02-26 NOTE — Progress Notes (Signed)
Called to room to assist during endoscopic procedure.  Patient ID and intended procedure confirmed with present staff. Received instructions for my participation in the procedure from the performing physician.  

## 2020-02-26 NOTE — Op Note (Signed)
[facility] Patient Name: Cassandra Santana Procedure Date: 02/26/2020 1:36 PM MRN: [id number] Endoscopist: Docia Chuck. Henrene Pastor , MD Age: 69 Referring MD:  Date of Birth: 1951-05-22 Gender: Female Account #: [id number] Procedure:                Colonoscopy with submucosal injection; with cold                            snare polypectomy x 5 Indications:              Iron deficiency anemia. Previous colonoscopy 2004                            was negative for neoplasia. Colonoscopy 2014 with                            small adenoma Medicines:                Monitored Anesthesia Care Procedure:                Pre-Anesthesia Assessment:                           - Prior to the procedure, a History and Physical                            was performed, and patient medications and                            allergies were reviewed. The patient's tolerance of                            previous anesthesia was also reviewed. The risks                            and benefits of the procedure and the sedation                            options and risks were discussed with the patient.                            All questions were answered, and informed consent                            was obtained. Prior Anticoagulants: The patient has                            taken Plavix (clopidogrel), last dose was 8 days                            prior to procedure. ASA Grade Assessment: III - A                            patient with severe systemic disease. After  reviewing the risks and benefits, the patient was                            deemed in satisfactory condition to undergo the                            procedure.                           After obtaining informed consent, the colonoscope                            was passed under direct vision. Throughout the                            procedure, the patient's blood pressure, pulse, and                             oxygen saturations were monitored continuously. The                            Colonoscope was introduced through the anus and                            advanced to the the cecum, identified by                            appendiceal orifice and ileocecal valve. The                            terminal ileum, ileocecal valve, appendiceal                            orifice, and rectum were photographed. The quality                            of the bowel preparation was good. The colonoscopy                            was performed without difficulty. The patient                            tolerated the procedure well. The bowel preparation                            used was SUPREP via split dose instruction. Scope In: 1:43:05 PM Scope Out: 2:06:48 PM Scope Withdrawal Time: 0 hours 19 minutes 58 seconds  Total Procedure Duration: 0 hours 23 minutes 43 seconds  Findings:                 The terminal ileum appeared normal.                           A 15 mm polyp was found in the transverse colon.  The polyp was sessile. The polyp was removed with a                            saline injection-lift technique (10 cc normal                            saline injected submucosally) using a cold snare.                            Resection and retrieval were complete.                           Four polyps were found in the transverse colon,                            ascending colon and cecum. The polyps were 2 to 4                            mm in size. These polyps were removed with a cold                            snare. Resection and retrieval were complete.                           Internal hemorrhoids were found during                            retroflexion. The hemorrhoids were moderate.                           The exam was otherwise without abnormality on                            direct and retroflexion views. Complications:            No immediate  complications. Estimated blood loss:                            None. Estimated Blood Loss:     Estimated blood loss: none. Impression:               - One 15 mm polyp in the transverse colon, removed                            using injection-lift and a cold snare. Resected and                            retrieved.                           - Four 2 to 4 mm polyps in the transverse colon, in                            the ascending colon and in the cecum, removed with  a cold snare. Resected and retrieved.                           - Internal hemorrhoids.                           - Normal terminal ileum                           - The examination was otherwise normal on direct                            and retroflexion views. Recommendation:           - Repeat colonoscopy in 3 years for surveillance.                           - Resume Plavix (clopidogrel) tomorrow at prior                            dose.                           - Patient has a contact number available for                            emergencies. The signs and symptoms of potential                            delayed complications were discussed with the                            patient. Return to normal activities tomorrow.                            Written discharge instructions were provided to the                            patient.                           - Resume previous diet.                           - Continue present medications.                           - Await pathology results. Docia Chuck. Henrene Pastor, MD 02/26/2020 2:17:07 PM This report has been signed electronically.

## 2020-02-26 NOTE — Progress Notes (Signed)
approx 1405 during withdrawal, pt HR dropped to 30s.  I was drawing up Robinul to treat and pt started vommiting dark fluid.  Pt head immediately dropped to steep t-burg and suction of oropharynx started.  Sedation and procedure haled and pt allowed to fully wake up and cough and swallow before sedated again for egd.  Dr Henrene Pastor aware and ok'd Robinul.  Less than 100cc in cannister/some on sheets

## 2020-02-27 ENCOUNTER — Encounter: Payer: Self-pay | Admitting: *Deleted

## 2020-02-27 ENCOUNTER — Telehealth: Payer: Self-pay | Admitting: Internal Medicine

## 2020-02-27 ENCOUNTER — Other Ambulatory Visit: Payer: Self-pay | Admitting: *Deleted

## 2020-02-27 DIAGNOSIS — D509 Iron deficiency anemia, unspecified: Secondary | ICD-10-CM

## 2020-02-27 MED ORDER — FERROUS SULFATE 325 (65 FE) MG PO TBEC: 325.0000 mg | DELAYED_RELEASE_TABLET | Freq: Two times a day (BID) | ORAL | 3 refills | Status: AC

## 2020-02-27 NOTE — Telephone Encounter (Signed)
Left a message for patient to call back. 

## 2020-02-27 NOTE — Telephone Encounter (Signed)
Pt is returning Emagene's phone call

## 2020-02-28 ENCOUNTER — Telehealth: Payer: Self-pay

## 2020-02-28 ENCOUNTER — Telehealth: Payer: Self-pay | Admitting: Adult Health

## 2020-02-28 NOTE — Telephone Encounter (Signed)
Left message on follow up call. 

## 2020-02-28 NOTE — Telephone Encounter (Signed)
Received a new hem referral from Dr. Henrene Pastor for IDA. Cassandra Santana has been cld and scheduled to see Mendel Ryder on 8/4 at 2pm w/labs at 130pm. Pt aware to arrive 15 minutes early.

## 2020-02-28 NOTE — Telephone Encounter (Signed)
Patient returned the call states she is well no questions at this time. ?

## 2020-02-28 NOTE — Telephone Encounter (Signed)
2nd follow up call made.  NALM 

## 2020-02-28 NOTE — Telephone Encounter (Signed)
See results note. 

## 2020-03-04 ENCOUNTER — Encounter: Payer: Self-pay | Admitting: Internal Medicine

## 2020-03-05 ENCOUNTER — Other Ambulatory Visit: Payer: Self-pay

## 2020-03-05 ENCOUNTER — Inpatient Hospital Stay: Payer: Medicare HMO | Attending: Adult Health | Admitting: Adult Health

## 2020-03-05 ENCOUNTER — Inpatient Hospital Stay: Payer: Medicare HMO

## 2020-03-05 ENCOUNTER — Telehealth: Payer: Self-pay | Admitting: Adult Health

## 2020-03-05 ENCOUNTER — Telehealth: Payer: Self-pay | Admitting: Internal Medicine

## 2020-03-05 VITALS — BP 132/71 | HR 74 | Temp 98.3°F | Resp 17 | Ht 63.0 in | Wt 138.7 lb

## 2020-03-05 DIAGNOSIS — D500 Iron deficiency anemia secondary to blood loss (chronic): Secondary | ICD-10-CM

## 2020-03-05 DIAGNOSIS — D509 Iron deficiency anemia, unspecified: Secondary | ICD-10-CM | POA: Diagnosis not present

## 2020-03-05 DIAGNOSIS — F1290 Cannabis use, unspecified, uncomplicated: Secondary | ICD-10-CM

## 2020-03-05 DIAGNOSIS — F17210 Nicotine dependence, cigarettes, uncomplicated: Secondary | ICD-10-CM | POA: Diagnosis not present

## 2020-03-05 DIAGNOSIS — Z800 Family history of malignant neoplasm of digestive organs: Secondary | ICD-10-CM

## 2020-03-05 LAB — RETIC PANEL
Immature Retic Fract: 40.9 % — ABNORMAL HIGH (ref 2.3–15.9)
RBC.: 4.19 MIL/uL (ref 3.87–5.11)
Retic Count, Absolute: 93.4 10*3/uL (ref 19.0–186.0)
Retic Ct Pct: 2.2 % (ref 0.4–3.1)
Reticulocyte Hemoglobin: 22.7 pg — ABNORMAL LOW (ref >27.9)

## 2020-03-05 LAB — CBC WITH DIFFERENTIAL (CANCER CENTER ONLY)
Abs Immature Granulocytes: 0.02 10*3/uL (ref 0.00–0.07)
Basophils Absolute: 0 10*3/uL (ref 0.0–0.1)
Basophils Relative: 1 %
Eosinophils Absolute: 0.4 10*3/uL (ref 0.0–0.5)
Eosinophils Relative: 5 %
HCT: 28.3 % — ABNORMAL LOW (ref 36.0–46.0)
Hemoglobin: 7.9 g/dL — ABNORMAL LOW (ref 12.0–15.0)
Immature Granulocytes: 0 %
Lymphocytes Relative: 20 %
Lymphs Abs: 1.6 10*3/uL (ref 0.7–4.0)
MCH: 18.8 pg — ABNORMAL LOW (ref 26.0–34.0)
MCHC: 27.9 g/dL — ABNORMAL LOW (ref 30.0–36.0)
MCV: 67.2 fL — ABNORMAL LOW (ref 80.0–100.0)
Monocytes Absolute: 0.9 10*3/uL (ref 0.1–1.0)
Monocytes Relative: 11 %
Neutro Abs: 5 10*3/uL (ref 1.7–7.7)
Neutrophils Relative %: 63 %
Platelet Count: 400 10*3/uL (ref 150–400)
RBC: 4.21 MIL/uL (ref 3.87–5.11)
RDW: 21.3 % — ABNORMAL HIGH (ref 11.5–15.5)
WBC Count: 8 10*3/uL (ref 4.0–10.5)
nRBC: 0 % (ref 0.0–0.2)

## 2020-03-05 LAB — IRON AND TIBC
Iron: 308 ug/dL — ABNORMAL HIGH (ref 41–142)
Saturation Ratios: 72 % — ABNORMAL HIGH (ref 21–57)
TIBC: 428 ug/dL (ref 236–444)
UIBC: 119 ug/dL — ABNORMAL LOW (ref 120–384)

## 2020-03-05 LAB — FERRITIN: Ferritin: 19 ng/mL (ref 11–307)

## 2020-03-05 LAB — SAVE SMEAR(SSMR), FOR PROVIDER SLIDE REVIEW

## 2020-03-05 LAB — FOLATE: Folate: 9 ng/mL (ref >5.9)

## 2020-03-05 LAB — VITAMIN B12: Vitamin B-12: 515 pg/mL (ref 180–914)

## 2020-03-05 NOTE — Telephone Encounter (Signed)
Left message for pt to call back  °

## 2020-03-05 NOTE — Telephone Encounter (Signed)
Patient just called states a nurse scheduled a follow up appointment without her knowing and that day will not work I tried to assist with rescheduling but the patient declined to schedule in October. Requesting to speak with a nurse

## 2020-03-05 NOTE — Telephone Encounter (Signed)
Scheduled appts per 8/4 los. Pt declined scheduling 3 month follow up with GM and stated she would call back when she is ready to schedule that appt. Gave pt a print out of AVS.

## 2020-03-05 NOTE — Progress Notes (Addendum)
[name]  [phone] [phone]     ID: Cassandra Santana DOB: April 02, 1951  MR#: [id number]  XMI#:[id number]  Patient Care Team: [name], DO as PCP - General (Family Medicine) [name], MD as PCP - Cardiology (Cardiology) Irene Shipper, MD as Consulting Physician (Gastroenterology) Magrinat, Virgie Dad, MD as Consulting Physician (Oncology) Scot Dock, NP OTHER MD:  CHIEF COMPLAINT:  Iron deficiency anemia  CURRENT TREATMENT: to receive IV iron   HISTORY OF CURRENT ILLNESS:  Cassandra Santana is a 69 year old woman who was referred to [place] by Dr. Henrene Pastor for consideration of IV iron.  Cassandra Santana has had a h/o iron deficiency and was referred to Dr. Henrene Pastor for work up and evaluation of GI etiology.  Cassandra Santana was started on oral iron and underwent upper endoscopy and colonoscopy on 02/26/2020 that found erosive esophagitis.  Cassandra Santana is taking Plavix as Cassandra Santana has h/o CAD, HTN, and NSTEMI.  Cassandra Santana was started on Protonix by Dr. Henrene Pastor, however on 7/27 Cassandra Santana ferritin was 5.9 and Cassandra Santana was referred to hematology for consideration and discussion of IV iron.    Cassandra Santana hemoglobin, MCV, and Ferritin have been as follows:  Results for HONG, TIMM (MRN [id number]) as of 03/05/2020 07:39  Ref. Range 04/07/2018 03:08 08/17/2018 12:59 08/29/2018 04:51 08/14/2019 14:09 02/26/2020 15:00  Hemoglobin Latest Ref Range: 12.0 - 15.0 g/dL 13.1 10.4 (L) 8.8 (L) 8.0 (L) 7.7 Repeated and verified X2. (LL)  Results for CARMELA, PIECHOWSKI (MRN [id number]) as of 03/05/2020 07:39  Ref. Range 04/07/2018 03:08 08/17/2018 12:59 08/29/2018 04:51 08/14/2019 14:09 02/26/2020 15:00  MCV Latest Ref Range: 78.0 - 100.0 fl 86.7 83 83.1 73 (L) 64.3 Repeated and verified X2. (L)   Results for NADYA, HOPWOOD (MRN [id number]) as of 03/05/2020 07:39  Ref. Range 02/26/2020 15:00  Ferritin Latest Ref Range: 10.0 - 291.0 ng/mL 5.9 (L)    The patient's subsequent history is as detailed below.  INTERVAL  HISTORY:  Cassandra Santana notes that Cassandra Santana is feeling moderately well.  Cassandra Santana notes that Cassandra Santana is anemic.  Cassandra Santana has been recommended to take oral iron, however Cassandra Santana says that Cassandra Santana cannot tolerated it due to GI upset and constipation, so Cassandra Santana hasn't been taking it.  Cassandra Santana denies significant fatigue, however does feel cold a lot and enjoys chewing on ice.     REVIEW OF SYSTEMS:  Cassandra Santana is doing well today other than noted above.  Cassandra Santana denies any fever or chills.  Cassandra Santana has no lymphadenopathy, unintentional weight loss, night sweats, bowel/bladder changes, headaches, vision issues, dizziness, cough, shortness of breath, chest pain, or palpitations.  A detailed ROS was otherwise non contributory.    PAST MEDICAL HISTORY: Past Medical History:  Diagnosis Date  . Allergies   . Anemia   . Arthritis   . CAD (coronary artery disease)   . Colitis   . Colon polyp   . High cholesterol   . HTN (hypertension)   . Hyperlipidemia   . Hypertension   . Internal hemorrhoids   . NSTEMI (non-ST elevated myocardial infarction) ([place]) 04/2018  . PAD (peripheral artery disease) ([place])   . Rectal polyp    hyperplastic    PAST SURGICAL HISTORY: Past Surgical History:  Procedure Laterality Date  . ABDOMINAL AORTOGRAM W/LOWER EXTREMITY Bilateral 08/28/2018   Procedure: ABDOMINAL AORTOGRAM W/LOWER EXTREMITY;  Surgeon: [name], MD;  Location: [facility];  Service: Cardiovascular;  Laterality: Bilateral;  . ABDOMINAL HYSTERECTOMY  1993  . CORONARY  STENT INTERVENTION N/A 04/06/2018   Procedure: CORONARY STENT INTERVENTION;  Surgeon: Sherren Mocha, MD;  Location: [facility];  Service: Cardiovascular;  Laterality: N/A;  . LEFT HEART CATH AND CORONARY ANGIOGRAPHY N/A 04/06/2018   Procedure: LEFT HEART CATH AND CORONARY ANGIOGRAPHY;  Surgeon: Sherren Mocha, MD;  Location: [facility];  Service: Cardiovascular;  Laterality: N/A;  . PERIPHERAL VASCULAR ATHERECTOMY Left 08/28/2018   Procedure: PERIPHERAL  VASCULAR ATHERECTOMY;  Surgeon: [name], MD;  Location: [facility];  Service: Cardiovascular;  Laterality: Left;  SFA  . ROTATOR CUFF REPAIR Right     FAMILY HISTORY Family History  Problem Relation Age of Onset  . Kidney failure Mother   . Diabetes Mother   . Heart disease Mother   . Heart attack Father   . Heart disease Father   . Diabetes Sister   . Heart disease Sister   . Pancreatic cancer Maternal Grandmother   . Colon cancer Neg Hx   . Colon polyps Neg Hx   . Esophageal cancer Neg Hx   . Rectal cancer Neg Hx   . Stomach cancer Neg Hx     GYNECOLOGIC HISTORY:  No LMP recorded. Patient has had a hysterectomy. Menarche: 69 years old Age at first live birth: 69 years old [place] P 1 LMP 49 Contraceptive s/p TAH HRT n/a  Hysterectomy? 1993 Salpingo-oophorectomy? One removed in [place] is separated and lives alone in [place], [place].  Cassandra Santana has no living children.  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  Cassandra Santana sister Cassandra Santana lives nearby.  Cassandra Santana denies any ETOH, but does smoke 1/3 ppd of cigarettes, and one marijuana joint daily.     ADVANCED DIRECTIVES: Not currently in place, however Cassandra Santana tells me Cassandra Santana would name Cassandra Santana sister, Cassandra Santana [phone].   HEALTH MAINTENANCE: Social History   Tobacco Use  . Smoking status: Current Every Day Smoker    Packs/day: 0.50    Types: Cigarettes    Last attempt to quit: 04/09/2018    Years since quitting: 1.9  . Smokeless tobacco: Never Used  Vaping Use  . Vaping Use: Never used  Substance Use Topics  . Alcohol use: No  . Drug use: Yes    Types: Marijuana    Comment: LAST USED 02/25/20 _0      Colonoscopy and upper endoscopy 02/26/2020  PAP:  Bone density:   Allergies  Allergen Reactions  . Keflex [Cephalexin] Hives  . Tomato Hives and Itching    Current Outpatient Medications  Medication Sig Dispense Refill  . acetaminophen (TYLENOL) 500 MG tablet Take 1,000 mg by mouth  daily as needed for moderate pain or headache.    Marland Kitchen amLODipine (NORVASC) 5 MG tablet Take 1 tablet (5 mg total) by mouth at bedtime. Appointment needed, no further refills until seen 90 tablet 3  . aspirin EC 81 MG EC tablet Take 1 tablet (81 mg total) by mouth daily.    Marland Kitchen atorvastatin (LIPITOR) 80 MG tablet Take 1 tablet (80 mg total) by mouth daily at 6 PM. 90 tablet 2  . clopidogrel (PLAVIX) 75 MG tablet TAKE 1 TABLET BY MOUTH ONCE DAILY WHEN  YOU  FINISH  YOUR  BRILINTA,  YOU  MAY  START  TAKING  THIS  MEDICATION 90 tablet 3  . clotrimazole (LOTRIMIN) 1 % cream     . ferrous sulfate 325 (65 FE) MG EC tablet Take 1 tablet (325 mg total) by  mouth 2 (two) times daily. 60 tablet 3  . lisinopril (ZESTRIL) 5 MG tablet Take 1 tablet by mouth once daily 90 tablet 3  . nitroGLYCERIN (NITROSTAT) 0.4 MG SL tablet Place 1 tablet (0.4 mg total) under the tongue every 5 (five) minutes x 3 doses as needed for chest pain. 25 tablet 1  . pantoprazole (PROTONIX) 40 MG tablet Take 1 tablet (40 mg total) by mouth daily. 30 tablet 3   No current facility-administered medications for this visit.    OBJECTIVE:  Vitals:   03/05/20 1345  BP: 132/71  Pulse: 74  Resp: 17  Temp: 98.3 F (36.8 C)  SpO2: 100%     Body mass index is 24.57 kg/m.   Wt Readings from Last 3 Encounters:  03/05/20 138 lb 11.2 oz (62.9 kg)  02/26/20 138 lb (62.6 kg)  11/28/19 138 lb 9.6 oz (62.9 kg)  ECOG FS:1 - Symptomatic but completely ambulatory GENERAL: Patient is a well appearing female in no acute distress HEENT:  Sclerae anicteric. Mask in place. Neck is supple.  NODES:  No cervical, supraclavicular, or axillary lymphadenopathy palpated.  BREAST EXAM:  Deferred. LUNGS:  Clear to auscultation bilaterally.  No wheezes or rhonchi. HEART:  Regular rate and rhythm. No murmur appreciated. ABDOMEN:  Soft, nontender.  Positive, normoactive bowel sounds. No organomegaly palpated. MSK:  No focal spinal tenderness to palpation.   EXTREMITIES:  No peripheral edema.   SKIN:  Clear with no obvious rashes or skin changes. No nail dyscrasia. NEURO:  Nonfocal. Well oriented.  Appropriate affect.     LAB RESULTS:  CMP     Component Value Date/Time   NA 140 08/29/2018 0451   NA 142 08/17/2018 1259   K 3.2 (L) 08/29/2018 0451   CL 111 08/29/2018 0451   CO2 21 (L) 08/29/2018 0451   GLUCOSE 96 08/29/2018 0451   BUN 7 (L) 08/29/2018 0451   BUN 6 (L) 08/17/2018 1259   CREATININE 0.93 08/29/2018 0451   CALCIUM 8.6 (L) 08/29/2018 0451   PROT 7.1 08/14/2019 1409   ALBUMIN 4.1 08/14/2019 1409   AST 14 08/14/2019 1409   ALT 9 08/14/2019 1409   ALKPHOS 124 (H) 08/14/2019 1409   BILITOT 0.3 08/14/2019 1409   GFRNONAA >60 08/29/2018 0451   GFRAA >60 08/29/2018 0451    No results found for: TOTALPROTELP, ALBUMINELP, A1GS, A2GS, BETS, BETA2SER, GAMS, MSPIKE, SPEI  No results found for: KPAFRELGTCHN, LAMBDASER, KAPLAMBRATIO  Lab Results  Component Value Date   WBC 8.0 03/05/2020   NEUTROABS 5.0 03/05/2020   HGB 7.9 (L) 03/05/2020   HCT 28.3 (L) 03/05/2020   MCV 67.2 (L) 03/05/2020   PLT 400 03/05/2020      Chemistry      Component Value Date/Time   NA 140 08/29/2018 0451   NA 142 08/17/2018 1259   K 3.2 (L) 08/29/2018 0451   CL 111 08/29/2018 0451   CO2 21 (L) 08/29/2018 0451   BUN 7 (L) 08/29/2018 0451   BUN 6 (L) 08/17/2018 1259   CREATININE 0.93 08/29/2018 0451      Component Value Date/Time   CALCIUM 8.6 (L) 08/29/2018 0451   ALKPHOS 124 (H) 08/14/2019 1409   AST 14 08/14/2019 1409   ALT 9 08/14/2019 1409   BILITOT 0.3 08/14/2019 1409       No results found for: LABCA2  No components found for: [id number]  No results for input(s): INR in the last 168 hours.  No results found for: LABCA2  No results found for: XLK440  No results found for: NUU725  No results found for: DGU440  No results found for: CA2729  No components found for: HGQUANT  No results found for: CEA1 / No  results found for: CEA1   No results found for: AFPTUMOR  No results found for: CHROMOGRNA  No results found for: PSA1  Appointment on 03/05/2020  Component Date Value Ref Range Status  . Folate 03/05/2020 9.0  >5.9 ng/mL Final   Performed at [facility] [address] [place], [place] [address]  . Vitamin B-12 03/05/2020 515  180 - 914 pg/mL Final   Comment: (NOTE) This assay is not validated for testing neonatal or myeloproliferative syndrome specimens for Vitamin B12 levels. Performed at [facility], [place] [address] [place], [place] [address]   . Ferritin 03/05/2020 19  11 - 307 ng/mL Final   Performed at [facility], [place] [address], [place], [place] [address]  . Iron 03/05/2020 308* 41 - 142 ug/dL Final  . TIBC 03/05/2020 428  236 - 444 ug/dL Final  . Saturation Ratios 03/05/2020 72* 21 - 57 % Final  . UIBC 03/05/2020 119* 120 - 384 ug/dL Final   Performed at [facility], [place] [address] [place], [place] [address]  . Smear Review 03/05/2020 SMEAR STAINED AND AVAILABLE FOR REVIEW   Final   Performed at [facility], [address], [place], [place] [address]  . Retic Ct Pct 03/05/2020 2.2  0.4 - 3.1 % Final  . RBC. 03/05/2020 4.19  3.87 - 5.11 MIL/uL Final  . Retic Count, Absolute 03/05/2020 93.4  19.0 - 186.0 K/uL Final  . Immature Retic Fract 03/05/2020 40.9* 2.3 - 15.9 % Final  . Reticulocyte Hemoglobin 03/05/2020 22.7* >27.9 pg Final   Comment:        A RET-He < 28 pg is an indication of iron-deficient or iron- insufficient erythropoiesis. Patients with thalassemia may also have a decreased RET-He result unrelated to iron availability.     If this patient has chronic kidney disease and does not have a hemoglobinopathy he/Cassandra Santana meets criteria for iron deficiency per the 2016 NICE guidelines. Refer to specific guidelines to determine the  appropriate thresholds for treating CKD- associated iron deficiency. TSAT and ferritin should be used in patients with hemoglobinopathies (e.g. thalassemia). Performed at [facility], [place] [address] [place], [place] [address]   . WBC Count 03/05/2020 8.0  4.0 - 10.5 K/uL Final  . RBC 03/05/2020 4.21  3.87 - 5.11 MIL/uL Final  . Hemoglobin 03/05/2020 7.9* 12.0 - 15.0 g/dL Final   Comment: Reticulocyte Hemoglobin testing may be clinically indicated, consider ordering this additional test [id number]   . HCT 03/05/2020 28.3* 36 - 46 % Final  . MCV 03/05/2020 67.2* 80.0 - 100.0 fL Final  . MCH 03/05/2020 18.8* 26.0 - 34.0 pg Final  . MCHC 03/05/2020 27.9* 30.0 - 36.0 g/dL Final  . RDW 03/05/2020 21.3* 11.5 - 15.5 % Final  . Platelet Count 03/05/2020 400  150 - 400 K/uL Final  . nRBC 03/05/2020 0.0  0.0 - 0.2 % Final  . Neutrophils Relative % 03/05/2020 63  % Final  . Neutro Abs 03/05/2020 5.0  1.7 - 7.7 K/uL Final  . Lymphocytes Relative 03/05/2020 20  % Final  . Lymphs Abs 03/05/2020 1.6  0.7 - 4.0 K/uL Final  . Monocytes Relative 03/05/2020 11  % Final  .  Monocytes Absolute 03/05/2020 0.9  0 - 1 K/uL Final  . Eosinophils Relative 03/05/2020 5  % Final  . Eosinophils Absolute 03/05/2020 0.4  0 - 0 K/uL Final  . Basophils Relative 03/05/2020 1  % Final  . Basophils Absolute 03/05/2020 0.0  0 - 0 K/uL Final  . Immature Granulocytes 03/05/2020 0  % Final  . Abs Immature Granulocytes 03/05/2020 0.02  0.00 - 0.07 K/uL Final   Performed at [facility], [place] [address] [place], [place] [address]    (this displays the last labs from the last 3 days)  No results found for: TOTALPROTELP, ALBUMINELP, A1GS, A2GS, BETS, BETA2SER, GAMS, MSPIKE, SPEI (this displays SPEP labs)  No results found for: KPAFRELGTCHN, LAMBDASER, KAPLAMBRATIO (kappa/lambda light chains)  No results found for: HGBA, HGBA2QUANT, HGBFQUANT,  HGBSQUAN (Hemoglobinopathy evaluation)   No results found for: LDH  Lab Results  Component Value Date   IRON 308 (H) 03/05/2020   TIBC 428 03/05/2020   IRONPCTSAT 72 (H) 03/05/2020   (Iron and TIBC)  Lab Results  Component Value Date   FERRITIN 19 03/05/2020    Urinalysis    Component Value Date/Time   COLORURINE YELLOW 01/22/2013 2013   APPEARANCEUR CLEAR 01/22/2013 2013   LABSPEC 1.028 01/22/2013 2013   [place] 7.0 01/22/2013 2013   [place] 01/22/2013 2013   [place] NEGATIVE 01/22/2013 2013   [place] NEGATIVE 01/22/2013 2013   [place] NEGATIVE 01/22/2013 2013   PROTEINUR NEGATIVE 01/22/2013 2013   UROBILINOGEN 0.2 01/22/2013 2013   NITRITE NEGATIVE 01/22/2013 2013   LEUKOCYTESUR NEGATIVE 01/22/2013 2013     STUDIES: No results found.   ASSESSMENT: 69 y.o. [place] woman with iron deficiency anemia.  1. Iron deficiency anemia  (a) Upper endoscopy and colonoscopy on 7/27 show erosive esophagitis--Protonix started  (b) Unable to tolerate oral iron, to receive IV feraheme x 2  PLAN: Cassandra Santana met with myself and Dr. Jana Hakim to review Cassandra Santana anemia.  Cassandra Santana understands that there are three main types of blood cells that are made in the marrow of bone.  These include: 1. White blood cells: These are immune cells, Cassandra Santana are normal in number and appearance. 2. Platelets: These are clotting cells, Cassandra Santana are normal in number and appearance. 3. Red blood cells: these are oxygen carrying cells.  Cassandra Santana's are decreased in number, and are hypochromic in appearance.  On smear review cigar shape cells are also noted, which can be seen in iron deficiency anemia.   We reviewed with Cassandra Santana, that since Cassandra Santana is s/p hysterectomy, Cassandra Santana upper endoscopy notes erosive esophagitis.  With Cassandra Santana being on plavix, this is likely the source of bleeding.  Cassandra Santana was started on Protonix and is taking that daily, which will hopefully resolve the inflammation, and stop any bleeding.  In  the meantime, Cassandra Santana cannot tolerate oral iron and is symptomatic from Cassandra Santana iron deficiency.  Cassandra Santana was recommended to receive IV feraheme x 2.  We reviewed this with Cassandra Santana in detail.    We will see Cassandra Santana back after Cassandra Santana receives Cassandra Santana Feraheme in 3 months for labs and f/u.  Cassandra Santana knows to call for any questions that may arise between now and Cassandra Santana next appointment.  We are happy to see Cassandra Santana sooner if needed.  Total encounter time: 45 minutes  Wilber Bihari, NP 03/06/20 9:47 AM Medical Oncology and Hematology [facility] [place],  [address] Tel. [phone]    Fax. [phone]   ADDENDUM: 69 year old [place] woman  with well documented iron deficiency anemia felt to be most likely secondary to erosive esophagitis, with bleeding exacerbated by Cassandra Santana antiplatelet therapy.  Cassandra Santana has been intolerant of oral iron.  We are going to proceed with intravenous iron replacement.  We discussed the various possibilities there as well as the possible toxicities side effects and complications.  Cassandra Santana is eager to proceed.  We will recheck Cassandra Santana counts in approximately 3 months to make sure Cassandra Santana has attained a complete recovery and if not Cassandra Santana may receive additional iron at that point.  I personally saw this patient and performed a substantive portion of this encounter with the listed APP documented above.   Chauncey Cruel, MD Medical Oncology and Hematology [facility] [address] [place], [place] [address] Tel. [phone]    Fax. [phone]   *Total Encounter Time as defined by the Centers for Medicare and Medicaid Services includes, in addition to the face-to-face time of a patient visit (documented in the note above) non-face-to-face time: obtaining and reviewing outside history, ordering and reviewing medications, tests or procedures, care coordination (communications with other health care professionals or caregivers) and documentation in the medical  record.

## 2020-03-06 ENCOUNTER — Encounter: Payer: Self-pay | Admitting: Adult Health

## 2020-03-12 NOTE — Telephone Encounter (Signed)
Spoke with pt and she states she is not near a calendar and she will need to call the office back to reschedule the appt.

## 2020-03-19 ENCOUNTER — Other Ambulatory Visit: Payer: Self-pay

## 2020-03-19 ENCOUNTER — Inpatient Hospital Stay: Payer: Medicare HMO

## 2020-03-19 VITALS — BP 135/58 | HR 67 | Temp 98.5°F | Resp 20

## 2020-03-19 DIAGNOSIS — Z800 Family history of malignant neoplasm of digestive organs: Secondary | ICD-10-CM | POA: Diagnosis not present

## 2020-03-19 DIAGNOSIS — F1290 Cannabis use, unspecified, uncomplicated: Secondary | ICD-10-CM | POA: Diagnosis not present

## 2020-03-19 DIAGNOSIS — F17210 Nicotine dependence, cigarettes, uncomplicated: Secondary | ICD-10-CM | POA: Diagnosis not present

## 2020-03-19 DIAGNOSIS — D509 Iron deficiency anemia, unspecified: Secondary | ICD-10-CM | POA: Diagnosis not present

## 2020-03-19 DIAGNOSIS — D500 Iron deficiency anemia secondary to blood loss (chronic): Secondary | ICD-10-CM

## 2020-03-19 MED ORDER — SODIUM CHLORIDE 0.9 % IV SOLN
Freq: Once | INTRAVENOUS | Status: AC
  Filled 2020-03-19: qty 250

## 2020-03-19 MED ORDER — SODIUM CHLORIDE 0.9 % IV SOLN
510.0000 mg | Freq: Once | INTRAVENOUS | Status: AC
  Administered 2020-03-19: 510 mg via INTRAVENOUS
  Filled 2020-03-19: qty 510

## 2020-03-19 NOTE — Patient Instructions (Signed)

## 2020-03-26 ENCOUNTER — Other Ambulatory Visit: Payer: Self-pay

## 2020-03-26 ENCOUNTER — Inpatient Hospital Stay: Payer: Medicare HMO

## 2020-03-26 VITALS — BP 134/62 | HR 67 | Temp 98.6°F | Resp 18

## 2020-03-26 DIAGNOSIS — F1290 Cannabis use, unspecified, uncomplicated: Secondary | ICD-10-CM | POA: Diagnosis not present

## 2020-03-26 DIAGNOSIS — F17210 Nicotine dependence, cigarettes, uncomplicated: Secondary | ICD-10-CM | POA: Diagnosis not present

## 2020-03-26 DIAGNOSIS — Z800 Family history of malignant neoplasm of digestive organs: Secondary | ICD-10-CM | POA: Diagnosis not present

## 2020-03-26 DIAGNOSIS — D509 Iron deficiency anemia, unspecified: Secondary | ICD-10-CM | POA: Diagnosis not present

## 2020-03-26 DIAGNOSIS — D500 Iron deficiency anemia secondary to blood loss (chronic): Secondary | ICD-10-CM

## 2020-03-26 MED ORDER — SODIUM CHLORIDE 0.9 % IV SOLN
510.0000 mg | Freq: Once | INTRAVENOUS | Status: AC
  Administered 2020-03-26: 510 mg via INTRAVENOUS
  Filled 2020-03-26: qty 510

## 2020-03-26 MED ORDER — SODIUM CHLORIDE 0.9 % IV SOLN
Freq: Once | INTRAVENOUS | Status: AC
  Filled 2020-03-26: qty 250

## 2020-03-26 NOTE — Patient Instructions (Signed)

## 2020-04-08 DIAGNOSIS — K5900 Constipation, unspecified: Secondary | ICD-10-CM | POA: Diagnosis not present

## 2020-04-08 DIAGNOSIS — F17200 Nicotine dependence, unspecified, uncomplicated: Secondary | ICD-10-CM | POA: Diagnosis not present

## 2020-04-08 DIAGNOSIS — R7303 Prediabetes: Secondary | ICD-10-CM | POA: Diagnosis not present

## 2020-04-08 DIAGNOSIS — I2510 Atherosclerotic heart disease of native coronary artery without angina pectoris: Secondary | ICD-10-CM | POA: Diagnosis not present

## 2020-04-08 DIAGNOSIS — D509 Iron deficiency anemia, unspecified: Secondary | ICD-10-CM | POA: Diagnosis not present

## 2020-04-08 DIAGNOSIS — I739 Peripheral vascular disease, unspecified: Secondary | ICD-10-CM | POA: Diagnosis not present

## 2020-04-17 ENCOUNTER — Encounter: Payer: Self-pay | Admitting: Internal Medicine

## 2020-04-17 ENCOUNTER — Other Ambulatory Visit (INDEPENDENT_AMBULATORY_CARE_PROVIDER_SITE_OTHER): Payer: Medicare HMO

## 2020-04-17 ENCOUNTER — Ambulatory Visit (INDEPENDENT_AMBULATORY_CARE_PROVIDER_SITE_OTHER): Payer: Medicare HMO | Admitting: Internal Medicine

## 2020-04-17 VITALS — BP 112/68 | HR 71 | Ht 63.0 in | Wt 140.2 lb

## 2020-04-17 DIAGNOSIS — Z86010 Personal history of colonic polyps: Secondary | ICD-10-CM | POA: Diagnosis not present

## 2020-04-17 DIAGNOSIS — D509 Iron deficiency anemia, unspecified: Secondary | ICD-10-CM | POA: Diagnosis not present

## 2020-04-17 DIAGNOSIS — K2980 Duodenitis without bleeding: Secondary | ICD-10-CM

## 2020-04-17 DIAGNOSIS — Z7902 Long term (current) use of antithrombotics/antiplatelets: Secondary | ICD-10-CM | POA: Diagnosis not present

## 2020-04-17 LAB — CBC WITH DIFFERENTIAL/PLATELET
Basophils Absolute: 0.2 10*3/uL — ABNORMAL HIGH (ref 0.0–0.1)
Basophils Relative: 3.7 % — ABNORMAL HIGH (ref 0.0–3.0)
Eosinophils Absolute: 0.3 10*3/uL (ref 0.0–0.7)
Eosinophils Relative: 4.6 % (ref 0.0–5.0)
HCT: 41.1 % (ref 36.0–46.0)
Hemoglobin: 13 g/dL (ref 12.0–15.0)
Lymphocytes Relative: 29.2 % (ref 12.0–46.0)
Lymphs Abs: 1.6 10*3/uL (ref 0.7–4.0)
MCHC: 31.6 g/dL (ref 30.0–36.0)
MCV: 80.9 fl (ref 78.0–100.0)
Monocytes Absolute: 0.5 10*3/uL (ref 0.1–1.0)
Monocytes Relative: 8.7 % (ref 3.0–12.0)
Neutro Abs: 2.9 10*3/uL (ref 1.4–7.7)
Neutrophils Relative %: 53.8 % (ref 43.0–77.0)
Platelets: 268 10*3/uL (ref 150.0–400.0)
RBC: 5.08 Mil/uL (ref 3.87–5.11)
RDW: 34.1 % — ABNORMAL HIGH (ref 11.5–15.5)
WBC: 5.5 10*3/uL (ref 4.0–10.5)

## 2020-04-17 LAB — FERRITIN: Ferritin: 104.4 ng/mL (ref 10.0–291.0)

## 2020-04-17 NOTE — Patient Instructions (Addendum)
Your provider has requested that you go to the basement level for lab work before leaving today. Press "B" on the elevator. The lab is located at the first door on the left as you exit the elevator.   Continue taking iron twice a day.  Continue taking Pantoprazole.  Follow up as needed

## 2020-04-17 NOTE — Progress Notes (Signed)
HISTORY OF PRESENT ILLNESS:  Cassandra Santana is a 69 y.o. 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  I saw her in consultation November 28, 2019 regarding significant iron deficiency anemia.  Hemoglobin at that time was 8.0 with MCV of 73.  Her baseline hemoglobin is around 13.5-14.  She was placed on iron replacement therapy.  She subsequently underwent colonoscopy and upper endoscopy February 26, 2020.  Upper endoscopy revealed duodenitis.  She is prescribed pantoprazole 40 mg daily colonoscopy revealed multiple tubular adenomas, 1 of which was large.  And internal hemorrhoids.  Follow-up in 3 years recommended.  Follow-up blood counts that they revealed persistent anemia despite iron therapy.  Hemoglobin at that time was 7.7.  She was referred to hematology who provided iron infusion therapy.  Her most recent hemoglobin was 9.7 on March 05, 2020.  Patient tells me that she is feeling well.  No fatigue.  No GI complaints.  She is taking iron twice daily as well as pantoprazole once daily.  She continues on Plavix therapy.  She has completed her Covid vaccination series.  REVIEW OF SYSTEMS:  All non-GI ROS negative otherwise stated in the HPI except for sinus allergy, headaches, itching, sleeping problems  Past Medical History:  Diagnosis Date  . Allergies   . Anemia   . Arthritis   . CAD (coronary artery disease)   . Colitis   . Colon polyp   . High cholesterol   . HTN (hypertension)   . Hyperlipidemia   . Hypertension   . Internal hemorrhoids   . NSTEMI (non-ST elevated myocardial infarction) ([place]) 04/2018  . PAD (peripheral artery disease) ([place])   . Rectal polyp    hyperplastic    Past Surgical History:  Procedure Laterality Date  . ABDOMINAL AORTOGRAM W/LOWER EXTREMITY Bilateral 08/28/2018   Procedure: ABDOMINAL AORTOGRAM W/LOWER EXTREMITY;  Surgeon: [name], MD;   Location: [facility];  Service: Cardiovascular;  Laterality: Bilateral;  . ABDOMINAL HYSTERECTOMY  1993  . CORONARY STENT INTERVENTION N/A 04/06/2018   Procedure: CORONARY STENT INTERVENTION;  Surgeon: Sherren Mocha, MD;  Location: [facility];  Service: Cardiovascular;  Laterality: N/A;  . LEFT HEART CATH AND CORONARY ANGIOGRAPHY N/A 04/06/2018   Procedure: LEFT HEART CATH AND CORONARY ANGIOGRAPHY;  Surgeon: Sherren Mocha, MD;  Location: [facility];  Service: Cardiovascular;  Laterality: N/A;  . PERIPHERAL VASCULAR ATHERECTOMY Left 08/28/2018   Procedure: PERIPHERAL VASCULAR ATHERECTOMY;  Surgeon: [name], MD;  Location: [facility];  Service: Cardiovascular;  Laterality: Left;  SFA  . ROTATOR CUFF REPAIR Right     Social History Cassandra Santana  reports that she has been smoking cigarettes. She has been smoking about 0.50 packs per day. She has never used smokeless tobacco. She reports current drug use. Drug: Marijuana. She reports that she does not drink alcohol.  family history includes Diabetes in her mother and sister; Heart attack in her father; Heart disease in her father, mother, and sister; Kidney failure in her mother; Pancreatic cancer in her maternal grandmother.  Allergies  Allergen Reactions  . Keflex [Cephalexin] Hives  . Tomato Hives and Itching       PHYSICAL EXAMINATION: Vital signs: BP 112/68   Pulse 71   Ht 5\' 3"  (1.6 m)   Wt 140 lb 4 oz (63.6 kg)   BMI 24.84 kg/m   Constitutional:  generally well-appearing, no acute distress Psychiatric: alert and oriented x3, cooperative Eyes: extraocular movements intact, anicteric, conjunctiva pink Mouth: oral pharynx moist, no lesions Neck: supple no lymphadenopathy Cardiovascular: heart regular rate and rhythm, no murmur Lungs: clear to auscultation bilaterally Abdomen: soft, nontender, nondistended, no obvious ascites, no peritoneal signs, normal bowel sounds, no  organomegaly Rectal: Normal at the time of colonoscopy Extremities: no clubbing, cyanosis, or lower extremity edema bilaterally Skin: no lesions on visible extremities Neuro: No focal deficits.  Cranial nerves intact  ASSESSMENT:  1.  Iron deficiency anemia.  This is a patient on chronic Plavix therapy.  Possible etiologies include arch colon polyps, duodenitis.  Could have small bowel pathology such as AVMs.  She is asymptomatic.  She has received fusion therapy under the surveillance of hematology. 2.  Multiple adenomatous colon polyps. 3.  Duodenitis.  On PPI 4.  Multiple medical problems   PLAN:  1.  Repeat CBC and ferritin today.  We will contact the patient when these results are available 2.  Continue oral iron 3.  Continue PPI for history of duodenitis in a patient on chronic Plavix therapy 4.  Surveillance colonoscopy in 3 years 5.  Interval follow-up as needed.  Her PCP can monitor her blood counts periodically.  If low and she needs iron infusion therapy, then her PCP can refer her back to hematology. A total time of 30 minutes was required preparing to see the patient, reviewing test, reviewing procedure reports and pathology, obtaining comprehensive history, performing medically appropriate exam, counseling the patient regarding her above listed issues.  Answering questions.  Directing medical therapy and follow-up.  As well, documenting clinical information in the health record

## 2020-04-20 DIAGNOSIS — Z20822 Contact with and (suspected) exposure to covid-19: Secondary | ICD-10-CM | POA: Diagnosis not present

## 2020-06-30 ENCOUNTER — Other Ambulatory Visit: Payer: Self-pay | Admitting: Internal Medicine

## 2020-06-30 MED ORDER — PANTOPRAZOLE SODIUM 40 MG PO TBEC
40.0000 mg | DELAYED_RELEASE_TABLET | Freq: Every day | ORAL | 3 refills | Status: DC
Start: 2020-06-30 — End: 2022-10-01

## 2020-06-30 NOTE — Telephone Encounter (Signed)
Prescription for pantoprazole was sent to patients pharmacy.

## 2020-08-21 ENCOUNTER — Other Ambulatory Visit: Payer: Self-pay | Admitting: Cardiovascular Disease

## 2020-09-18 ENCOUNTER — Other Ambulatory Visit (HOSPITAL_COMMUNITY): Payer: Self-pay | Admitting: Cardiovascular Disease

## 2020-09-18 ENCOUNTER — Other Ambulatory Visit: Payer: Self-pay

## 2020-09-18 ENCOUNTER — Ambulatory Visit (HOSPITAL_COMMUNITY)
Admission: RE | Admit: 2020-09-18 | Discharge: 2020-09-18 | Disposition: A | Discharge status: Home / Self Care | Payer: Medicare HMO | Source: Ambulatory Visit | Attending: Cardiology | Admitting: Cardiology

## 2020-09-18 DIAGNOSIS — I739 Peripheral vascular disease, unspecified: Secondary | ICD-10-CM | POA: Diagnosis not present

## 2020-09-18 DIAGNOSIS — Z9862 Peripheral vascular angioplasty status: Secondary | ICD-10-CM | POA: Diagnosis not present

## 2020-09-24 ENCOUNTER — Telehealth: Payer: Self-pay | Admitting: Cardiovascular Disease

## 2020-09-24 MED ORDER — NITROGLYCERIN 0.4 MG SL SUBL: 0.4000 mg | SUBLINGUAL_TABLET | SUBLINGUAL | 2 refills | Status: DC | PRN

## 2020-09-24 NOTE — Telephone Encounter (Signed)
    *  STAT* If patient is at the pharmacy, call can be transferred to refill team.   1. Which medications need to be refilled? (please list name of each medication and dose if known)   nitroGLYCERIN (NITROSTAT) 0.4 MG SL tablet    2. Which pharmacy/location (including street and city if local pharmacy) is medication to be sent to? [place] ([place]), [facility] [address]  3. Do they need a 30 day or 90 day supply? 90 days

## 2020-10-19 ENCOUNTER — Other Ambulatory Visit: Payer: Self-pay | Admitting: Cardiovascular Disease

## 2020-10-29 ENCOUNTER — Encounter: Payer: Self-pay | Admitting: Cardiovascular Disease

## 2020-10-29 ENCOUNTER — Other Ambulatory Visit: Payer: Self-pay

## 2020-10-29 ENCOUNTER — Ambulatory Visit: Payer: Medicare HMO | Admitting: Cardiovascular Disease

## 2020-10-29 DIAGNOSIS — I214 Non-ST elevation (NSTEMI) myocardial infarction: Secondary | ICD-10-CM

## 2020-10-29 DIAGNOSIS — E782 Mixed hyperlipidemia: Secondary | ICD-10-CM

## 2020-10-29 DIAGNOSIS — I10 Essential (primary) hypertension: Secondary | ICD-10-CM | POA: Diagnosis not present

## 2020-10-29 DIAGNOSIS — Z720 Tobacco use: Secondary | ICD-10-CM | POA: Diagnosis not present

## 2020-10-29 DIAGNOSIS — I739 Peripheral vascular disease, unspecified: Secondary | ICD-10-CM | POA: Diagnosis not present

## 2020-10-29 NOTE — Assessment & Plan Note (Signed)
History of non-STEMI status post cardiac catheterization performed by Dr. Burt Knack 04/05/2018 revealing a high-grade AV groove circumflex stenosis which underwent PCI and drug-eluting stenting with a synergy drug-eluting stent.  She did have some mild disease in her PDA.  EF was normal.  She remains on dual antiplatelet therapy.  She denies chest pain or shortness of breath.

## 2020-10-29 NOTE — Assessment & Plan Note (Signed)
History of essential hypertension a blood pressure measured today 108/78.  She is on amlodipine, and lisinopril.

## 2020-10-29 NOTE — Assessment & Plan Note (Signed)
History of hyperlipidemia on statin therapy with lipid profile performed 10/17/2019 revealing total cholesterol 112, LDL 49 and HDL of 44.  I am going to recheck a lipid liver profile.

## 2020-10-29 NOTE — Patient Instructions (Signed)
Medication Instructions:  Your physician recommends that you continue on your current medications as directed. Please refer to the Current Medication list given to you today.  *If you need a refill on your cardiac medications before your next appointment, please call your pharmacy*   Lab Work: Your physician recommends that you return for lab work in: next 1-2 weeks for fasting lipid/liver profile.   If you have labs (blood work) drawn today and your tests are completely normal, you will receive your results only by: Marland Kitchen MyChart Message (if you have MyChart) OR . A paper copy in the mail If you have any lab test that is abnormal or we need to change your treatment, we will call you to review the results.   Testing/Procedures: Your physician has requested that you have a lower extremity arterial duplex. This test is an ultrasound of the arteries in the legs. It looks at arterial blood flow in the legs. Allow one hour for Lower Arterial scans. There are no restrictions or special instructions  Your physician has requested that you have an ankle brachial index (ABI). During this test an ultrasound and blood pressure cuff are used to evaluate the arteries that supply the arms and legs with blood. Allow thirty minutes for this exam. There are no restrictions or special instructions.  These procedures are done at [facility]. 2nd Floor. To be done in Feb 2023.   Follow-Up: At [facility], you and your health needs are our priority.  As part of our continuing mission to provide you with exceptional heart care, we have created designated Provider Care Teams.  These Care Teams include your primary Cardiologist (physician) and Advanced Practice Providers (APPs -  Physician Assistants and Nurse Practitioners) who all work together to provide you with the care you need, when you need it.  We recommend signing up for the patient portal called "MyChart".  Sign up information is provided on this  After Visit Summary.  MyChart is used to connect with patients for Virtual Visits (Telemedicine).  Patients are able to view lab/test results, encounter notes, upcoming appointments, etc.  Non-urgent messages can be sent to your provider as well.   To learn more about what you can do with MyChart, go to [email or web address].    Your next appointment:   12 month(s)  The format for your next appointment:   In Person  Provider:   Quay Burow, MD

## 2020-10-29 NOTE — Progress Notes (Signed)
10/29/2020 Cassandra Santana   07/23/1951  [id number]  Primary Physician [name], DO Primary Cardiologist: [name] MD [name], [place], [place]  HPI:  Cassandra Santana is a 70 y.o.  mildly overweight divorced African-American female with no children who works as a Quarry manager.I last  saw her in the office 08/10/2019. She had a non-STEMI 04/05/2018.She underwent radial diagnostic cath by Dr. Burt Knack the following day revealing a high-grade mid AV groove circumflex which underwent PCI and drug-eluting stenting using a synergy drug-eluting stent. She had mild disease in approximately the moderate disease of the PDA. EF was normal. She is placed on dual antiplatelet therapy including aspirin and Brilinta as well as high-dose statin therapy. The problems include treated hypertension although suboptimally and hyperlipidemia on high-dose statin therapy although she does complain of some myalgias related to this.  Because of complaints of bilateral lower extremity pain she had Doppler studies performed 08/04/2018 revealing a right ABI of 0.71 and a left ABI 0.64. She had an occluded distal right SFA and an occluded left popliteal artery. Her symptoms are lifestyle limiting. She did stop smoking the day after her non-STEMI. She underwent peripheral angiography by myself 08/28/2018 documenting bilateral SFA CTO's with three-vessel runoff. She underwent Hawk 1 directional atherectomy followed by drug-coated balloon angioplasty on the left SFA with an excellent angiographic result. Her claudication has resolved and her Dopplers performed 09/15/2018 have markedly improved.  Since I saw her a year ago she still does not have claudication.  She continues to smoke 7 to 8 cigarettes a day.  She denies chest pain or shortness of breath.  Recent lower extremity arterial Doppler studies performed 09/18/2020 revealed a right ABI of 0.71 and left of 1.06 with a widely patent left SFA.  Current Meds   Medication Sig  . acetaminophen (TYLENOL) 500 MG tablet Take 1,000 mg by mouth daily as needed for moderate pain or headache.  Marland Kitchen amLODipine (NORVASC) 5 MG tablet Take 1 tablet (5 mg total) by mouth daily.  Marland Kitchen aspirin EC 81 MG EC tablet Take 1 tablet (81 mg total) by mouth daily.  Marland Kitchen atorvastatin (LIPITOR) 80 MG tablet Take 1 tablet (80 mg total) by mouth daily at 6 PM.  . clopidogrel (PLAVIX) 75 MG tablet TAKE 1 TABLET BY MOUTH ONCE DAILY WHEN  YOU  FINISH  YOUR  BRILINTA,  YOU  MAY  START  TAKING  THIS  MEDICATION  . clotrimazole (LOTRIMIN) 1 % cream   . ferrous sulfate 325 (65 FE) MG EC tablet Take 1 tablet (325 mg total) by mouth 2 (two) times daily.  Marland Kitchen lisinopril (ZESTRIL) 5 MG tablet Take 1 tablet by mouth once daily  . nitroGLYCERIN (NITROSTAT) 0.4 MG SL tablet Place 1 tablet (0.4 mg total) under the tongue every 5 (five) minutes x 3 doses as needed for chest pain.  . pantoprazole (PROTONIX) 40 MG tablet Take 1 tablet (40 mg total) by mouth daily.     Allergies  Allergen Reactions  . Keflex [Cephalexin] Hives  . Tomato Hives and Itching    Social History   Socioeconomic History  . Marital status: Legally Separated    Spouse name: Not on file  . Number of children: Not on file  . Years of education: Not on file  . Highest education level: Not on file  Occupational History  . Occupation: Forensic psychologist: [name]  Tobacco Use  . Smoking status: Current Every Day Smoker  Packs/day: 0.50    Types: Cigarettes    Last attempt to quit: 04/09/2018    Years since quitting: 2.5  . Smokeless tobacco: Never Used  Vaping Use  . Vaping Use: Never used  Substance and Sexual Activity  . Alcohol use: No  . Drug use: Yes    Types: Marijuana    Comment: LAST USED 02/25/20 @1800   . Sexual activity: Yes    Birth control/protection: Surgical  Other Topics Concern  . Not on file  Social History Narrative  . Not on file   Social Determinants of Health   Financial Resource  Strain: Not on file  Food Insecurity: Not on file  Transportation Needs: Not on file  Physical Activity: Not on file  Stress: Not on file  Social Connections: Not on file  Intimate Partner Violence: Not on file     Review of Systems: General: negative for chills, fever, night sweats or weight changes.  Cardiovascular: negative for chest pain, dyspnea on exertion, edema, orthopnea, palpitations, paroxysmal nocturnal dyspnea or shortness of breath Dermatological: negative for rash Respiratory: negative for cough or wheezing Urologic: negative for hematuria Abdominal: negative for nausea, vomiting, diarrhea, bright red blood per rectum, melena, or hematemesis Neurologic: negative for visual changes, syncope, or dizziness All other systems reviewed and are otherwise negative except as noted above.    Blood pressure 108/78, pulse 68, height 5\' 3"  (1.6 m), weight 133 lb (60.3 kg).  General appearance: alert and no distress Neck: no adenopathy, no carotid bruit, no JVD, supple, symmetrical, trachea midline and thyroid not enlarged, symmetric, no tenderness/mass/nodules Lungs: clear to auscultation bilaterally Heart: regular rate and rhythm, S1, S2 normal, no murmur, click, rub or gallop Extremities: extremities normal, atraumatic, no cyanosis or edema Pulses: Absent right pedal pulse Skin: Skin color, texture, turgor normal. No rashes or lesions Neurologic: Alert and oriented X 3, normal strength and tone. Normal symmetric reflexes. Normal coordination and gait  EKG sinus rhythm at 68 without ST or T wave changes.  I personally reviewed this EKG.  ASSESSMENT AND PLAN:   NSTEMI (non-ST elevated myocardial infarction) [facility]) History of non-STEMI status post cardiac catheterization performed by Dr. Burt Knack 04/05/2018 revealing a high-grade AV groove circumflex stenosis which underwent PCI and drug-eluting stenting with a synergy drug-eluting stent.  She did have some mild disease in her PDA.   EF was normal.  She remains on dual antiplatelet therapy.  She denies chest pain or shortness of breath.  Hypertension History of essential hypertension a blood pressure measured today 108/78.  She is on amlodipine, and lisinopril.  Hyperlipidemia History of hyperlipidemia on statin therapy with lipid profile performed 10/17/2019 revealing total cholesterol 112, LDL 49 and HDL of 44.  I am going to recheck a lipid liver profile.  Tobacco use Ongoing tobacco abuse of 7 to 8 cigarettes a day recalcitrant to risk factor modification.  Peripheral arterial disease (HCC) History of peripheral arterial disease with known occluded SFAs bilaterally status post left SFA directional atherectomy followed by drug-coated balloon angioplasty on 08/28/2018.  Her claudication resolved and her Dopplers normalized.  Most recent Dopplers performed 09/28/2020 revealed a right ABI of 0.72 and a left of 1.06 with a widely patent left SFA.  She says that her right leg really does not bother her.      [name] MD FACP,FACC,FAHA, [facility] 10/29/2020 2:20 PM

## 2020-10-29 NOTE — Assessment & Plan Note (Signed)
Ongoing tobacco abuse of 7 to 8 cigarettes a day recalcitrant to risk factor modification.

## 2020-10-29 NOTE — Assessment & Plan Note (Signed)
History of peripheral arterial disease with known occluded SFAs bilaterally status post left SFA directional atherectomy followed by drug-coated balloon angioplasty on 08/28/2018.  Her claudication resolved and her Dopplers normalized.  Most recent Dopplers performed 09/28/2020 revealed a right ABI of 0.72 and a left of 1.06 with a widely patent left SFA.  She says that her right leg really does not bother her.

## 2020-11-10 ENCOUNTER — Telehealth: Payer: Self-pay | Admitting: Cardiovascular Disease

## 2020-11-10 NOTE — Telephone Encounter (Signed)
   *  STAT* If patient is at the pharmacy, call can be transferred to refill team.   1. Which medications need to be refilled? (please list name of each medication and dose if known)   atorvastatin (LIPITOR) 80 MG tablet    clopidogrel (PLAVIX) 75 MG tablet    2. Which pharmacy/location (including street and city if local pharmacy) is medication to be sent to? [place] ([place]), [place] - [address]  3. Do they need a 30 day or 90 day supply? 90 days   Pt said need prior auth to refill these meds

## 2020-11-11 MED ORDER — ATORVASTATIN CALCIUM 80 MG PO TABS: 80.0000 mg | ORAL_TABLET | Freq: Every day | ORAL | 2 refills | Status: DC

## 2020-11-11 NOTE — Addendum Note (Signed)
Addended by: Alvin Critchley on: 11/11/2020 02:18 PM   Modules accepted: Orders

## 2020-11-11 NOTE — Addendum Note (Signed)
Addended by: Alvin Critchley on: 11/11/2020 02:20 PM   Modules accepted: Orders

## 2020-11-12 ENCOUNTER — Other Ambulatory Visit: Payer: Self-pay | Admitting: Cardiovascular Disease

## 2020-11-12 DIAGNOSIS — E782 Mixed hyperlipidemia: Secondary | ICD-10-CM | POA: Diagnosis not present

## 2020-11-12 MED ORDER — CLOPIDOGREL BISULFATE 75 MG PO TABS: 75.0000 mg | ORAL_TABLET | Freq: Every day | ORAL | 3 refills | Status: DC

## 2020-11-12 NOTE — Telephone Encounter (Signed)
Patient walked into the office for patient wanted refill for clopidogrel 75 mg daily

## 2020-11-12 NOTE — Addendum Note (Signed)
Addended by: Alvin Critchley on: 11/12/2020 01:34 PM   Modules accepted: Orders

## 2020-11-13 LAB — LIPID PANEL
Chol/HDL Ratio: 3 ratio (ref 0.0–4.4)
Cholesterol, Total: 124 mg/dL (ref 100–199)
HDL: 42 mg/dL (ref >39)
LDL Chol Calc (NIH): 61 mg/dL (ref 0–99)
Triglycerides: 114 mg/dL (ref 0–149)
VLDL Cholesterol Cal: 21 mg/dL (ref 5–40)

## 2020-11-13 LAB — HEPATIC FUNCTION PANEL
ALT: 11 IU/L (ref 0–32)
AST: 13 IU/L (ref 0–40)
Albumin: 4.3 g/dL (ref 3.8–4.8)
Alkaline Phosphatase: 110 IU/L (ref 44–121)
Bilirubin Total: 0.3 mg/dL (ref 0.0–1.2)
Bilirubin, Direct: <0.1 mg/dL (ref 0.00–0.40)
Total Protein: 7 g/dL (ref 6.0–8.5)

## 2021-01-06 ENCOUNTER — Other Ambulatory Visit: Payer: Self-pay | Admitting: Family Medicine

## 2021-01-06 DIAGNOSIS — Z1231 Encounter for screening mammogram for malignant neoplasm of breast: Secondary | ICD-10-CM

## 2021-01-26 ENCOUNTER — Ambulatory Visit
Admission: RE | Admit: 2021-01-26 | Discharge: 2021-01-26 | Disposition: A | Discharge status: Home / Self Care | Payer: Medicare HMO | Source: Ambulatory Visit | Attending: Family Medicine | Admitting: Family Medicine

## 2021-01-26 ENCOUNTER — Other Ambulatory Visit: Payer: Self-pay

## 2021-01-26 DIAGNOSIS — Z1231 Encounter for screening mammogram for malignant neoplasm of breast: Secondary | ICD-10-CM

## 2021-01-29 ENCOUNTER — Other Ambulatory Visit: Payer: Self-pay | Admitting: Family Medicine

## 2021-01-29 DIAGNOSIS — R928 Other abnormal and inconclusive findings on diagnostic imaging of breast: Secondary | ICD-10-CM

## 2021-02-24 ENCOUNTER — Ambulatory Visit
Admission: RE | Admit: 2021-02-24 | Discharge: 2021-02-24 | Disposition: A | Discharge status: Home / Self Care | Payer: Medicare HMO | Source: Ambulatory Visit | Attending: Family Medicine | Admitting: Family Medicine

## 2021-02-24 ENCOUNTER — Telehealth: Payer: Self-pay | Admitting: Cardiovascular Disease

## 2021-02-24 ENCOUNTER — Other Ambulatory Visit: Payer: Self-pay

## 2021-02-24 DIAGNOSIS — R928 Other abnormal and inconclusive findings on diagnostic imaging of breast: Secondary | ICD-10-CM

## 2021-02-24 DIAGNOSIS — R922 Inconclusive mammogram: Secondary | ICD-10-CM | POA: Diagnosis not present

## 2021-02-24 MED ORDER — LISINOPRIL 5 MG PO TABS: 5.0000 mg | ORAL_TABLET | Freq: Every day | ORAL | 3 refills | Status: DC

## 2021-02-24 NOTE — Telephone Encounter (Signed)
*  STAT* If patient is at the pharmacy, call can be transferred to refill team.   1. Which medications need to be refilled? (please list name of each medication and dose if known) lisinopril (ZESTRIL) 5 MG tablet  2. Which pharmacy/location (including street and city if local pharmacy) is medication to be sent to? [id number] - [place] ([place]), [place] - [address]  3. Do they need a 30 day or 90 day supply? 90  

## 2021-02-25 ENCOUNTER — Other Ambulatory Visit: Payer: Medicare HMO

## 2021-05-20 ENCOUNTER — Encounter: Payer: Self-pay | Admitting: Cardiovascular Disease

## 2021-05-20 ENCOUNTER — Other Ambulatory Visit: Payer: Self-pay

## 2021-05-20 ENCOUNTER — Ambulatory Visit: Payer: Medicare HMO | Admitting: Cardiovascular Disease

## 2021-05-20 VITALS — BP 124/66 | HR 85 | Ht 63.0 in | Wt 131.4 lb

## 2021-05-20 DIAGNOSIS — I10 Essential (primary) hypertension: Secondary | ICD-10-CM | POA: Diagnosis not present

## 2021-05-20 DIAGNOSIS — E782 Mixed hyperlipidemia: Secondary | ICD-10-CM

## 2021-05-20 DIAGNOSIS — I739 Peripheral vascular disease, unspecified: Secondary | ICD-10-CM

## 2021-05-20 DIAGNOSIS — Z720 Tobacco use: Secondary | ICD-10-CM | POA: Diagnosis not present

## 2021-05-20 DIAGNOSIS — I214 Non-ST elevation (NSTEMI) myocardial infarction: Secondary | ICD-10-CM | POA: Diagnosis not present

## 2021-05-20 NOTE — Assessment & Plan Note (Signed)
History of hyperlipidemia on statin therapy with lipid profile performed 11/12/2020 revealing total cholesterol 124, LDL 61 HDL 42.

## 2021-05-20 NOTE — Progress Notes (Signed)
05/20/2021 Cassandra Santana   [birth year]  [id number]  Primary Physician [name], DO Primary Cardiologist: [name] MD [name], [place], [place]  HPI:  Cassandra Santana is a 70 y.o.  mildly overweight divorced African-American female with no children who works as a [employer]ar[employer] manager.  I last  saw her in the office 10/29/2020.  She had a non-STEMI 04/05/2018.  She underwent radial diagnostic cath by Dr. Burt Knack the following day revealing a high-grade mid AV groove circumflex which underwent PCI and drug-eluting stenting using a synergy drug-eluting stent.  She had mild disease in the proximal LAD and moderate disease of the PDA.  EF was normal.  She is placed on dual antiplatelet therapy including aspirin and Brilinta as well as high-dose statin therapy.  The problems include treated hypertension although suboptimally and hyperlipidemia on high-dose statin therapy although she does complain of some myalgias related to this.   Because of complaints of bilateral lower extremity pain she had Doppler studies performed 08/04/2018 revealing a right ABI of 0.71 and a left ABI 0.64.  She had an occluded distal right SFA and an occluded left popliteal artery.  Her symptoms are lifestyle limiting.  She did stop smoking the day after her non-STEMI. She underwent peripheral angiography by myself 08/28/2018 documenting bilateral SFA CTO's with three-vessel runoff.  She underwent Hawk 1 directional atherectomy followed by drug-coated balloon angioplasty on the left SFA with an excellent angiographic result.  Her claudication has resolved and her Dopplers performed 09/15/2018 have markedly improved.   Since I saw her a year ago she still does not have claudication.  She continues to smoke 7 to 8 cigarettes a day.  She denies chest pain or shortness of breath.  Recent lower extremity arterial Doppler studies performed 10/17/2019 revealed a right ABI of 0.71 and left of 1.06 with a widely patent left SFA.   Current  Meds  Medication Sig   acetaminophen (TYLENOL) 500 MG tablet Take 1,000 mg by mouth daily as needed for moderate pain or headache.   amLODipine (NORVASC) 5 MG tablet Take 1 tablet (5 mg total) by mouth daily.   aspirin EC 81 MG EC tablet Take 1 tablet (81 mg total) by mouth daily.   atorvastatin (LIPITOR) 80 MG tablet Take 1 tablet (80 mg total) by mouth daily at 6 PM.   clopidogrel (PLAVIX) 75 MG tablet Take 1 tablet (75 mg total) by mouth daily.   clotrimazole (LOTRIMIN) 1 % cream    ferrous sulfate 325 (65 FE) MG EC tablet Take 1 tablet (325 mg total) by mouth 2 (two) times daily.   lisinopril (ZESTRIL) 5 MG tablet Take 1 tablet (5 mg total) by mouth daily.   nitroGLYCERIN (NITROSTAT) 0.4 MG SL tablet Place 1 tablet (0.4 mg total) under the tongue every 5 (five) minutes x 3 doses as needed for chest pain.   pantoprazole (PROTONIX) 40 MG tablet Take 1 tablet (40 mg total) by mouth daily.     Allergies  Allergen Reactions   Keflex [Cephalexin] Hives   Tomato Hives and Itching    Social History   Socioeconomic History   Marital status: Legally Separated    Spouse name: Not on file   Number of children: Not on file   Years of education: Not on file   Highest education level: Not on file  Occupational History   Occupation: CNA    Employer: [name]  Tobacco Use   Smoking status: Every Day  Packs/day: 0.50    Types: Cigarettes    Last attempt to quit: 04/09/2018    Years since quitting: 3.1   Smokeless tobacco: Never  Vaping Use   Vaping Use: Never used  Substance and Sexual Activity   Alcohol use: No   Drug use: Yes    Types: Marijuana    Comment: LAST USED 02/25/20 @1800    Sexual activity: Yes    Birth control/protection: Surgical  Other Topics Concern   Not on file  Social History Narrative   Not on file   Social Determinants of Health   Financial Resource Strain: Not on file  Food Insecurity: Not on file  Transportation Needs: Not on file  Physical  Activity: Not on file  Stress: Not on file  Social Connections: Not on file  Intimate Partner Violence: Not on file     Review of Systems: General: negative for chills, fever, night sweats or weight changes.  Cardiovascular: negative for chest pain, dyspnea on exertion, edema, orthopnea, palpitations, paroxysmal nocturnal dyspnea or shortness of breath Dermatological: negative for rash Respiratory: negative for cough or wheezing Urologic: negative for hematuria Abdominal: negative for nausea, vomiting, diarrhea, bright red blood per rectum, melena, or hematemesis Neurologic: negative for visual changes, syncope, or dizziness All other systems reviewed and are otherwise negative except as noted above.    Blood pressure 124/66, pulse 85, height 5\' 3"  (1.6 m), weight 131 lb 6.4 oz (59.6 kg), SpO2 99 %.  General appearance: alert and no distress Neck: no adenopathy, no carotid bruit, no JVD, supple, symmetrical, trachea midline, and thyroid not enlarged, symmetric, no tenderness/mass/nodules Lungs: clear to auscultation bilaterally Heart: regular rate and rhythm, S1, S2 normal, no murmur, click, rub or gallop Extremities: extremities normal, atraumatic, no cyanosis or edema Pulses: Diminished right pedal pulse Skin: Skin color, texture, turgor normal. No rashes or lesions Neurologic: Grossly normal  EKG sinus rhythm at 80 without ST or T wave changes.  Personally reviewed this EKG.  ASSESSMENT AND PLAN:   NSTEMI (non-ST elevated myocardial infarction) ([place]) History of CAD status post non-STEMI 04/05/2018.  She underwent radial diagnostic cath by Dr. Burt Knack and stenting of a high-grade mid AV groove circumflex.  She did have mild to moderate mid to distal RCA stenosis that did not appear obstructive.  She remains on dual antiplatelet therapy.  She denies chest pain or shortness of breath.  Hypertension History of essential hypertension a blood pressure measured today 124/66.  She is on  amlodipine and lisinopril.  Hyperlipidemia History of hyperlipidemia on statin therapy with lipid profile performed 11/12/2020 revealing total cholesterol 124, LDL 61 HDL 42.  Tobacco use History of ongoing tobacco abuse of 1 Pack per day recalcitrant to risk factor modification.  Peripheral arterial disease (HCC) History of peripheral arterial disease with known bilateral SFA occlusions status post left SFA directional atherectomy followed by drug-coated balloon angioplasty 08/28/2018.  Her Dopplers reveal her left SFA vein to be widely patent.  She denies claudication.  She does complain of some right groin pain that does not sound vascular.  We will repeat her lower extremity arterial Doppler studies.     [name] MD FACP,FACC,FAHA, [facility] 05/20/2021 10:08 AM

## 2021-05-20 NOTE — Assessment & Plan Note (Signed)
History of CAD status post non-STEMI 04/05/2018.  She underwent radial diagnostic cath by Dr. Burt Knack and stenting of a high-grade mid AV groove circumflex.  She did have mild to moderate mid to distal RCA stenosis that did not appear obstructive.  She remains on dual antiplatelet therapy.  She denies chest pain or shortness of breath.

## 2021-05-20 NOTE — Assessment & Plan Note (Signed)
History of peripheral arterial disease with known bilateral SFA occlusions status post left SFA directional atherectomy followed by drug-coated balloon angioplasty 08/28/2018.  Her Dopplers reveal her left SFA vein to be widely patent.  She denies claudication.  She does complain of some right groin pain that does not sound vascular.  We will repeat her lower extremity arterial Doppler studies.

## 2021-05-20 NOTE — Patient Instructions (Signed)
Medication Instructions:  Your physician recommends that you continue on your current medications as directed. Please refer to the Current Medication list given to you today.  *If you need a refill on your cardiac medications before your next appointment, please call your pharmacy*   Testing/Procedures: Your physician has requested that you have a lower extremity arterial duplex. This test is an ultrasound of the arteries in the legs. It looks at arterial blood flow in the legs. Allow one hour for Lower Arterial scans. There are no restrictions or special instructions   Your physician has requested that you have an ankle brachial index (ABI). During this test an ultrasound and blood pressure cuff are used to evaluate the arteries that supply the arms and legs with blood. Allow thirty minutes for this exam. There are no restrictions or special instructions.  To be done in Feb 2023. These procedures are done at [facility].    Follow-Up: At [facility], you and your health needs are our priority.  As part of our continuing mission to provide you with exceptional heart care, we have created designated Provider Care Teams.  These Care Teams include your primary Cardiologist (physician) and Advanced Practice Providers (APPs -  Physician Assistants and Nurse Practitioners) who all work together to provide you with the care you need, when you need it.  We recommend signing up for the patient portal called "MyChart".  Sign up information is provided on this After Visit Summary.  MyChart is used to connect with patients for Virtual Visits (Telemedicine).  Patients are able to view lab/test results, encounter notes, upcoming appointments, etc.  Non-urgent messages can be sent to your provider as well.   To learn more about what you can do with MyChart, go to [email or web address].    Your next appointment:   12 month(s)  The format for your next appointment:   In Person  Provider:    Quay Burow, MD

## 2021-05-20 NOTE — Assessment & Plan Note (Signed)
History of ongoing tobacco abuse of 1 Pack per day recalcitrant to risk factor modification.

## 2021-05-20 NOTE — Assessment & Plan Note (Signed)
History of essential hypertension a blood pressure measured today 124/66.  She is on amlodipine and lisinopril.

## 2021-06-29 ENCOUNTER — Telehealth: Payer: Self-pay | Admitting: Cardiovascular Disease

## 2021-06-29 NOTE — Telephone Encounter (Signed)
Called patient, advised that the scan should be done in February, both ABI and lower extremity. She is unable to completed them in February but can have them done in March which is fine if that works for her scheduled.   Patient aware that they will run this  with her new insurance and let her know of any issues.  Patient thankful for call back- she will await to be called to get scheduled.   Scheduling please call to scheduled.   Thank you!

## 2021-06-29 NOTE — Telephone Encounter (Signed)
Pt called regarding having a test on her legs. Pt said she saw Gwenlyn Found in October and was told to schedule an appt with him for February. I see the pt has 2 Active orders for  Vas Ultrasounds, pt is not aware if Gwenlyn Found wants her to have the [employer] or just an OV with [facility] [facility]. Pt wants to know if the Vas Ultrasounds have been authorized by her insurance company and she's not sure if she'll have this particular coverage in 2023.

## 2021-08-11 ENCOUNTER — Telehealth: Payer: Self-pay | Admitting: Cardiovascular Disease

## 2021-08-11 ENCOUNTER — Other Ambulatory Visit: Payer: Self-pay

## 2021-08-11 MED ORDER — ATORVASTATIN CALCIUM 80 MG PO TABS: 80.0000 mg | ORAL_TABLET | Freq: Every day | ORAL | 2 refills | Status: DC

## 2021-08-11 MED ORDER — NITROGLYCERIN 0.4 MG SL SUBL: 0.4000 mg | SUBLINGUAL_TABLET | SUBLINGUAL | 2 refills | Status: DC | PRN

## 2021-08-11 NOTE — Telephone Encounter (Signed)
°*  STAT* If patient is at the pharmacy, call can be transferred to refill team.   1. Which medications need to be refilled? (please list name of each medication and dose if known) atorvastatin (LIPITOR) 80 MG tablet  2. Which pharmacy/location (including street and city if local pharmacy) is medication to be sent to? [facility] ([place]), [place] - [address]  3. Do they need a 30 day or 90 day supply? 90 day supply

## 2021-09-07 ENCOUNTER — Encounter (HOSPITAL_COMMUNITY): Payer: Self-pay

## 2021-09-07 ENCOUNTER — Other Ambulatory Visit: Payer: Self-pay

## 2021-09-07 ENCOUNTER — Ambulatory Visit (HOSPITAL_COMMUNITY)
Admission: EM | Admit: 2021-09-07 | Discharge: 2021-09-07 | Disposition: A | Discharge status: Home / Self Care | Payer: Medicare HMO | Attending: Physician Assistant | Admitting: Physician Assistant

## 2021-09-07 DIAGNOSIS — R8290 Unspecified abnormal findings in urine: Secondary | ICD-10-CM | POA: Diagnosis not present

## 2021-09-07 DIAGNOSIS — H65191 Other acute nonsuppurative otitis media, right ear: Secondary | ICD-10-CM

## 2021-09-07 LAB — POCT URINALYSIS DIPSTICK, ED / UC
Bilirubin Urine: NEGATIVE
Glucose, UA: NEGATIVE mg/dL
Hgb urine dipstick: NEGATIVE
Ketones, ur: NEGATIVE mg/dL
Leukocytes,Ua: NEGATIVE
Nitrite: NEGATIVE
Protein, ur: NEGATIVE mg/dL
Specific Gravity, Urine: 1.02 (ref 1.005–1.030)
Urobilinogen, UA: 0.2 mg/dL (ref 0.0–1.0)
pH: 7 (ref 5.0–8.0)

## 2021-09-07 MED ORDER — SULFAMETHOXAZOLE-TRIMETHOPRIM 800-160 MG PO TABS: 1.0000 | ORAL_TABLET | Freq: Two times a day (BID) | ORAL | 0 refills | Status: DC

## 2021-09-07 NOTE — ED Provider Notes (Signed)
[facility]    CSN: [id number] Arrival date & time: 09/07/21  1310      History   Chief Complaint Chief Complaint  Patient presents with   Urinary Tract Infection    Ear pain      HPI Cassandra Santana is a 71 y.o. female.   Patient here today for evaluation of malodorous urine she has had for several days. She has not had any dysuria. She reports some lower abdominal discomfort but no nausea or vomiting, no back pain. She does have history of UTIs as well as pyelonephritis.  She also reports some ear pain. She has had nasal congestion. She denies fever.   The history is provided by the patient.   Past Medical History:  Diagnosis Date   Allergies    Anemia    Arthritis    CAD (coronary artery disease)    Colitis    Colon polyp    High cholesterol    HTN (hypertension)    Hyperlipidemia    Hypertension    Internal hemorrhoids    NSTEMI (non-ST elevated myocardial infarction) ([place]) 04/2018   PAD (peripheral artery disease) (HCC)    Rectal polyp    hyperplastic    Patient Active Problem List   Diagnosis Date Noted   Iron deficiency anemia due to chronic blood loss 03/05/2020   Claudication in peripheral vascular disease ([place]) 08/28/2018   Peripheral arterial disease ([place]) 08/08/2018   Hypertension 04/07/2018   Hyperlipidemia 04/07/2018   Tobacco use 04/07/2018   NSTEMI (non-ST elevated myocardial infarction) ([place]) 04/05/2018    Past Surgical History:  Procedure Laterality Date   ABDOMINAL AORTOGRAM W/LOWER EXTREMITY Bilateral 08/28/2018   Procedure: ABDOMINAL AORTOGRAM W/LOWER EXTREMITY;  Surgeon: [name], MD;  Location: [facility];  Service: Cardiovascular;  Laterality: Bilateral;   ABDOMINAL HYSTERECTOMY  1993   CORONARY STENT INTERVENTION N/A 04/06/2018   Procedure: CORONARY STENT INTERVENTION;  Surgeon: Sherren Mocha, MD;  Location: [facility];  Service: Cardiovascular;  Laterality: N/A;   LEFT HEART CATH AND  CORONARY ANGIOGRAPHY N/A 04/06/2018   Procedure: LEFT HEART CATH AND CORONARY ANGIOGRAPHY;  Surgeon: Sherren Mocha, MD;  Location: [facility];  Service: Cardiovascular;  Laterality: N/A;   PERIPHERAL VASCULAR ATHERECTOMY Left 08/28/2018   Procedure: PERIPHERAL VASCULAR ATHERECTOMY;  Surgeon: [name], MD;  Location: [facility];  Service: Cardiovascular;  Laterality: Left;  SFA   ROTATOR CUFF REPAIR Right     OB History   No obstetric history on file.      Home Medications    Prior to Admission medications   Medication Sig Start Date End Date Taking? Authorizing Provider  sulfamethoxazole-trimethoprim (BACTRIM DS) 800-160 MG tablet Take 1 tablet by mouth 2 (two) times daily for 7 days. 09/07/21 09/14/21 Yes Francene Finders, PA-C  acetaminophen (TYLENOL) 500 MG tablet Take 1,000 mg by mouth daily as needed for moderate pain or headache.    [provider]  amLODipine (NORVASC) 5 MG tablet Take 1 tablet (5 mg total) by mouth daily. 10/20/20   Leonie Man, MD  aspirin EC 81 MG EC tablet Take 1 tablet (81 mg total) by mouth daily. 04/08/18   Cheryln Manly, NP  atorvastatin (LIPITOR) 80 MG tablet Take 1 tablet (80 mg total) by mouth daily at 6 PM. 08/11/21   [name], MD  clopidogrel (PLAVIX) 75 MG tablet Take 1 tablet (75 mg total) by mouth daily. 11/12/20  [name], MD  clotrimazole (LOTRIMIN) 1 % cream  10/22/19   [provider]  ferrous sulfate 325 (65 FE) MG EC tablet Take 1 tablet (325 mg total) by mouth 2 (two) times daily. 02/27/20   Irene Shipper, MD  lisinopril (ZESTRIL) 5 MG tablet Take 1 tablet (5 mg total) by mouth daily. 02/24/21   [name], MD  nitroGLYCERIN (NITROSTAT) 0.4 MG SL tablet Place 1 tablet (0.4 mg total) under the tongue every 5 (five) minutes x 3 doses as needed for chest pain. 08/11/21   [name], MD  pantoprazole (PROTONIX) 40 MG tablet Take 1 tablet (40 mg total) by mouth daily. 06/30/20    Irene Shipper, MD    Family History Family History  Problem Relation Age of Onset   Kidney failure Mother    Diabetes Mother    Heart disease Mother    Heart attack Father    Heart disease Father    Diabetes Sister    Heart disease Sister    Pancreatic cancer Maternal Grandmother    Colon cancer Neg Hx    Colon polyps Neg Hx    Esophageal cancer Neg Hx    Rectal cancer Neg Hx    Stomach cancer Neg Hx     Social History Social History   Tobacco Use   Smoking status: Every Day    Packs/day: 0.50    Types: Cigarettes    Last attempt to quit: 04/09/2018    Years since quitting: 3.4   Smokeless tobacco: Never  Vaping Use   Vaping Use: Never used  Substance Use Topics   Alcohol use: No   Drug use: Yes    Types: Marijuana    Comment: LAST USED 02/25/20 @1800      Allergies   Keflex [cephalexin] and Tomato   Review of Systems Review of Systems  Constitutional:  Negative for chills and fever.  HENT:  Positive for congestion and ear pain.   Respiratory:  Negative for shortness of breath.   Gastrointestinal:  Positive for abdominal pain. Negative for nausea and vomiting.  Genitourinary:  Negative for dysuria and frequency.  Musculoskeletal:  Negative for back pain.    Physical Exam Triage Vital Signs ED Triage Vitals  Enc Vitals Group     BP      Pulse      Resp      Temp      Temp src      SpO2      Weight      Height      Head Circumference      Peak Flow      Pain Score      Pain Loc      Pain Edu?      Excl. in [place]?    No data found.  Updated Vital Signs BP (!) 149/86 (BP Location: Left Arm)    Pulse 82    Temp 97.8 F (36.6 C) (Oral)    Resp 16    SpO2 98%      Physical Exam Vitals and nursing note reviewed.  Constitutional:      General: She is not in acute distress.    Appearance: Normal appearance. She is not ill-appearing.  HENT:     Head: Normocephalic and atraumatic.     Left Ear: Tympanic membrane normal.     Ears:     Comments:  Right TM injected    Nose: Congestion present.  Eyes:  Conjunctiva/sclera: Conjunctivae normal.  Cardiovascular:     Rate and Rhythm: Normal rate.  Pulmonary:     Effort: Pulmonary effort is normal. No respiratory distress.  Skin:    General: Skin is warm and dry.  Neurological:     Mental Status: She is alert.  Psychiatric:        Mood and Affect: Mood normal.        Thought Content: Thought content normal.     UC Treatments / Results  Labs (all labs ordered are listed, but only abnormal results are displayed) Labs Reviewed  URINE CULTURE  POCT URINALYSIS DIPSTICK, ED / UC    EKG   Radiology No results found.  Procedures Procedures (including critical care time)  Medications Ordered in UC Medications - No data to display  Initial Impression / Assessment and Plan / UC Course  I have reviewed the triage vital signs and the nursing notes.  Pertinent labs & imaging results that were available during my care of the patient were reviewed by me and considered in my medical decision making (see chart for details).    Will treat to cover otitis media with bactrim that would also cover possible UTI. Discussed that UA was normal. Urine culture ordered. Encouraged follow up with any further concerns.   Final Clinical Impressions(s) / UC Diagnoses   Final diagnoses:  Other non-recurrent acute nonsuppurative otitis media of right ear  Malodorous urine   Discharge Instructions   None    ED Prescriptions     Medication Sig Dispense Auth. Provider   sulfamethoxazole-trimethoprim (BACTRIM DS) 800-160 MG tablet Take 1 tablet by mouth 2 (two) times daily for 7 days. 14 tablet Francene Finders, PA-C      PDMP not reviewed this encounter.   Francene Finders, PA-C 09/07/21 [phone]

## 2021-09-07 NOTE — ED Triage Notes (Signed)
Pt presents for UTI symptoms. She reports an odor for several days.  Pt has ear pain.

## 2021-09-10 LAB — URINE CULTURE: Culture: >=100000 — AB

## 2021-09-13 ENCOUNTER — Other Ambulatory Visit: Payer: Self-pay

## 2021-09-13 ENCOUNTER — Ambulatory Visit (HOSPITAL_COMMUNITY)
Admission: EM | Admit: 2021-09-13 | Discharge: 2021-09-13 | Disposition: A | Discharge status: Home / Self Care | Payer: Medicare HMO | Attending: Emergency Medicine | Admitting: Emergency Medicine

## 2021-09-13 ENCOUNTER — Encounter (HOSPITAL_COMMUNITY): Payer: Self-pay | Admitting: Emergency Medicine

## 2021-09-13 DIAGNOSIS — T7840XA Allergy, unspecified, initial encounter: Secondary | ICD-10-CM

## 2021-09-13 DIAGNOSIS — I10 Essential (primary) hypertension: Secondary | ICD-10-CM | POA: Diagnosis not present

## 2021-09-13 DIAGNOSIS — N3000 Acute cystitis without hematuria: Secondary | ICD-10-CM

## 2021-09-13 MED ORDER — METHYLPREDNISOLONE SODIUM SUCC 125 MG IJ SOLR
125.0000 mg | Freq: Once | INTRAMUSCULAR | Status: AC
  Administered 2021-09-13: 125 mg via INTRAMUSCULAR

## 2021-09-13 MED ORDER — CIPROFLOXACIN HCL 250 MG PO TABS: 250.0000 mg | ORAL_TABLET | Freq: Two times a day (BID) | ORAL | 0 refills | Status: AC

## 2021-09-13 MED ORDER — METHYLPREDNISOLONE SODIUM SUCC 125 MG IJ SOLR
INTRAMUSCULAR | Status: AC
  Filled 2021-09-13: qty 2

## 2021-09-13 NOTE — Discharge Instructions (Addendum)
TheI have documented Bactrim as an allergy in your chart so that you will not be prescribed this medication by any provider at [facility] health ever again.    To relieve your symptoms of rash and itching, you are provided with an injection of methylprednisolone, potent steroid that should resolve your symptoms in the next 3 to 5 hours.  To complete treatment of your urinary tract infection, recommend that you begin ciprofloxacin 250 mg twice daily for the next 3 days.  You continue to have symptoms after completing ciprofloxacin, please return for repeat urinalysis and urine culture.  Your blood pressure during your visit today was very elevated, I am concerned that your current regimen of lisinopril 5 mg and amlodipine 5 mg daily is not working as well as it should.  Please reach out to your primary care provider to discuss adjustment of your hypertension medications.  Thank you for visiting urgent care today.  I appreciate the opportunity to participate in your care.

## 2021-09-13 NOTE — ED Provider Notes (Signed)
[place]    CSN: [id number] Arrival date & time: 09/13/21  1243    HISTORY   Chief Complaint  Patient presents with   Allergic Reaction   HPI Cassandra Santana is a 71 y.o. female. Pt reports allergic reaction to Bactrim. Pt states she has been having itching, rash, tingling, fainting, and fatigue. Pt also reports an RN called advising patient to discontinue taking medication, patient states she did.  Patient states that she is no longer having any burning with urination but continues to have urinary frequency and increased urge to urinate along with incomplete emptying.  Patient has significantly elevated blood pressure on arrival, states she has been taking her medications as prescribed.  Patient denies chest pain, shortness of breath, headache, vision changes.  The history is provided by the patient.  Past Medical History:  Diagnosis Date   Allergies    Anemia    Arthritis    CAD (coronary artery disease)    Colitis    Colon polyp    High cholesterol    HTN (hypertension)    Hyperlipidemia    Hypertension    Internal hemorrhoids    NSTEMI (non-ST elevated myocardial infarction) ([facility]) 04/2018   PAD (peripheral artery disease) ([facility])    Rectal polyp    hyperplastic   Patient Active Problem List   Diagnosis Date Noted   Iron deficiency anemia due to chronic blood loss 03/05/2020   Claudication in peripheral vascular disease ([facility]) 08/28/2018   Peripheral arterial disease ([facility]) 08/08/2018   Hypertension 04/07/2018   Hyperlipidemia 04/07/2018   Tobacco use 04/07/2018   NSTEMI (non-ST elevated myocardial infarction) ([facility]) 04/05/2018   Past Surgical History:  Procedure Laterality Date   ABDOMINAL AORTOGRAM W/LOWER EXTREMITY Bilateral 08/28/2018   Procedure: ABDOMINAL AORTOGRAM W/LOWER EXTREMITY;  Surgeon: [name], MD;  Location: [facility];  Service: Cardiovascular;  Laterality: Bilateral;   ABDOMINAL HYSTERECTOMY  1993   CORONARY STENT  INTERVENTION N/A 04/06/2018   Procedure: CORONARY STENT INTERVENTION;  Surgeon: Sherren Mocha, MD;  Location: [facility];  Service: Cardiovascular;  Laterality: N/A;   LEFT HEART CATH AND CORONARY ANGIOGRAPHY N/A 04/06/2018   Procedure: LEFT HEART CATH AND CORONARY ANGIOGRAPHY;  Surgeon: Sherren Mocha, MD;  Location: [facility];  Service: Cardiovascular;  Laterality: N/A;   PERIPHERAL VASCULAR ATHERECTOMY Left 08/28/2018   Procedure: PERIPHERAL VASCULAR ATHERECTOMY;  Surgeon: [name], MD;  Location: [facility];  Service: Cardiovascular;  Laterality: Left;  SFA   ROTATOR CUFF REPAIR Right    OB History   No obstetric history on file.    Home Medications    Prior to Admission medications   Medication Sig Start Date End Date Taking? Authorizing Provider  acetaminophen (TYLENOL) 500 MG tablet Take 1,000 mg by mouth daily as needed for moderate pain or headache.    [provider]  amLODipine (NORVASC) 5 MG tablet Take 1 tablet (5 mg total) by mouth daily. 10/20/20   Leonie Man, MD  aspirin EC 81 MG EC tablet Take 1 tablet (81 mg total) by mouth daily. 04/08/18   Cheryln Manly, NP  atorvastatin (LIPITOR) 80 MG tablet Take 1 tablet (80 mg total) by mouth daily at 6 PM. 08/11/21   [name], MD  clopidogrel (PLAVIX) 75 MG tablet Take 1 tablet (75 mg total) by mouth daily. 11/12/20   [name], MD  clotrimazole (LOTRIMIN) 1 % cream  10/22/19  [provider]  ferrous sulfate 325 (65 FE) MG EC tablet Take 1 tablet (325 mg total) by mouth 2 (two) times daily. 02/27/20   Irene Shipper, MD  lisinopril (ZESTRIL) 5 MG tablet Take 1 tablet (5 mg total) by mouth daily. 02/24/21   [name], MD  nitroGLYCERIN (NITROSTAT) 0.4 MG SL tablet Place 1 tablet (0.4 mg total) under the tongue every 5 (five) minutes x 3 doses as needed for chest pain. 08/11/21   [name], MD  pantoprazole (PROTONIX) 40 MG tablet Take 1 tablet (40 mg  total) by mouth daily. 06/30/20   Irene Shipper, MD  sulfamethoxazole-trimethoprim (BACTRIM DS) 800-160 MG tablet Take 1 tablet by mouth 2 (two) times daily for 7 days. 09/07/21 09/14/21  Francene Finders, PA-C    Family History Family History  Problem Relation Age of Onset   Kidney failure Mother    Diabetes Mother    Heart disease Mother    Heart attack Father    Heart disease Father    Diabetes Sister    Heart disease Sister    Pancreatic cancer Maternal Grandmother    Colon cancer Neg Hx    Colon polyps Neg Hx    Esophageal cancer Neg Hx    Rectal cancer Neg Hx    Stomach cancer Neg Hx    Social History Social History   Tobacco Use   Smoking status: Every Day    Packs/day: 0.50    Types: Cigarettes    Last attempt to quit: 04/09/2018    Years since quitting: 3.4   Smokeless tobacco: Never  Vaping Use   Vaping Use: Never used  Substance Use Topics   Alcohol use: No   Drug use: Yes    Types: Marijuana    Comment: LAST USED 02/25/20 @1800    Allergies   Bactrim [sulfamethoxazole-trimethoprim], Keflex [cephalexin], and Tomato  Review of Systems Review of Systems Pertinent findings noted in history of present illness.   Physical Exam Triage Vital Signs ED Triage Vitals  Enc Vitals Group     BP 05/29/21 0827 (!) 147/82     Pulse Rate 05/29/21 0827 72     Resp 05/29/21 0827 18     Temp 05/29/21 0827 98.3 F (36.8 C)     Temp Source 05/29/21 0827 Oral     SpO2 05/29/21 0827 98 %     Weight --      Height --      Head Circumference --      Peak Flow --      Pain Score 05/29/21 0826 5     Pain Loc --      Pain Edu? --      Excl. in [place]? --   No data found.  Updated Vital Signs BP (!) 173/73 (BP Location: Left Arm)    Pulse 67    Temp 98.1 F (36.7 C) (Oral)    Resp 17    Ht 5\' 3"  (1.6 m)    Wt 131 lb 9.5 oz (59.7 kg)    SpO2 97%    BMI 23.31 kg/m   Physical Exam Vitals and nursing note reviewed.  Constitutional:      General: She is not in acute  distress.    Appearance: Normal appearance. She is not ill-appearing.  HENT:     Head: Normocephalic and atraumatic.  Eyes:     General: Lids are normal.        Right eye: No  discharge.        Left eye: No discharge.     Extraocular Movements: Extraocular movements intact.     Conjunctiva/sclera: Conjunctivae normal.     Right eye: Right conjunctiva is not injected.     Left eye: Left conjunctiva is not injected.  Neck:     Trachea: Trachea and phonation normal.  Cardiovascular:     Rate and Rhythm: Normal rate and regular rhythm.     Pulses: Normal pulses.     Heart sounds: Normal heart sounds. No murmur heard.   No friction rub. No gallop.  Pulmonary:     Effort: Pulmonary effort is normal. No accessory muscle usage, prolonged expiration or respiratory distress.     Breath sounds: Normal breath sounds. No stridor, decreased air movement or transmitted upper airway sounds. No decreased breath sounds, wheezing, rhonchi or rales.  Chest:     Chest wall: No tenderness.  Musculoskeletal:        General: Normal range of motion.     Cervical back: Normal range of motion and neck supple. Normal range of motion.  Lymphadenopathy:     Cervical: No cervical adenopathy.  Skin:    General: Skin is warm and dry.     Findings: Rash (Urticarial on both extremities and torso) present. No erythema.  Neurological:     General: No focal deficit present.     Mental Status: She is alert and oriented to person, place, and time.  Psychiatric:        Mood and Affect: Mood normal.        Behavior: Behavior normal.    Visual Acuity Right Eye Distance:   Left Eye Distance:   Bilateral Distance:    Right Eye Near:   Left Eye Near:    Bilateral Near:     UC Couse / Diagnostics / Procedures:    EKG  Radiology No results found.  Procedures Procedures (including critical care time)  UC Diagnoses / Final Clinical Impressions(s)   I have reviewed the triage vital signs and the nursing  notes.  Pertinent labs & imaging results that were available during my care of the patient were reviewed by me and considered in my medical decision making (see chart for details).    Final diagnoses:  Allergic reaction to drug, initial encounter  Acute cystitis without hematuria  Essential hypertension   Patient discontinued Bactrim as recommended by the RN she spoke with.  Bactrim has been listed in her chart as an allergy.  Patient will be provided with a prescription for ciprofloxacin given her urine culture resulted Klebsiella pneumoniae.  Patient advised she only needs 3 days at this point but she does not have resolution of her UTI symptoms after 3 days of Cipro, she should return for repeat evaluation.  ED Prescriptions     Medication Sig Dispense Auth. Provider   ciprofloxacin (CIPRO) 250 MG tablet Take 1 tablet (250 mg total) by mouth every 12 (twelve) hours for 3 days. 6 tablet Lynden Oxford Scales, PA-C      PDMP not reviewed this encounter.  Pending results:  Labs Reviewed - No data to display  Medications Ordered in UC: Medications  methylPREDNISolone sodium succinate (SOLU-MEDROL) 125 mg/2 mL injection 125 mg (has no administration in time range)    Disposition Upon Discharge:  Condition: stable for discharge home Home: take medications as prescribed; routine discharge instructions as discussed; follow up as advised.  Patient presented with an acute illness with associated systemic symptoms  and significant discomfort requiring urgent management. In my opinion, this is a condition that a prudent lay person (someone who possesses an average knowledge of health and medicine) may potentially expect to result in complications if not addressed urgently such as respiratory distress, impairment of bodily function or dysfunction of bodily organs.   Routine symptom specific, illness specific and/or disease specific instructions were discussed with the patient and/or caregiver  at length.   As such, the patient has been evaluated and assessed, work-up was performed and treatment was provided in alignment with urgent care protocols and evidence based medicine.  Patient/parent/caregiver has been advised that the patient may require follow up for further testing and treatment if the symptoms continue in spite of treatment, as clinically indicated and appropriate.  If the patient was tested for COVID-19, Influenza and/or RSV, then the patient/parent/guardian was advised to isolate at home pending the results of his/her diagnostic coronavirus test and potentially longer if theyre positive. I have also advised pt that if his/her COVID-19 test returns positive, it's recommended to self-isolate for at least 10 days after symptoms first appeared AND until fever-free for 24 hours without fever reducer AND other symptoms have improved or resolved. Discussed self-isolation recommendations as well as instructions for household member/close contacts as per the [facility] and [place] DHHS, and also gave patient the [place] packet with this information.  Patient/parent/caregiver has been advised to return to the [facility] or PCP in 3-5 days if no better; to PCP or the Emergency Department if new signs and symptoms develop, or if the current signs or symptoms continue to change or worsen for further workup, evaluation and treatment as clinically indicated and appropriate  The patient will follow up with their current PCP if and as advised. If the patient does not currently have a PCP we will assist them in obtaining one.   The patient may need specialty follow up if the symptoms continue, in spite of conservative treatment and management, for further workup, evaluation, consultation and treatment as clinically indicated and appropriate.   Patient/parent/caregiver verbalized understanding and agreement of plan as discussed.  All questions were addressed during visit.  Please see discharge instructions below for  further details of plan.  Discharge Instructions:   Discharge Instructions      TheI have documented Bactrim as an allergy in your chart so that you will not be prescribed this medication by any provider at [facility] health ever again.    To relieve your symptoms of rash and itching, you are provided with an injection of methylprednisolone, potent steroid that should resolve your symptoms in the next 3 to 5 hours.  To complete treatment of your urinary tract infection, recommend that you begin ciprofloxacin 250 mg twice daily for the next 3 days.  You continue to have symptoms after completing ciprofloxacin, please return for repeat urinalysis and urine culture.  Your blood pressure during your visit today was very elevated, I am concerned that your current regimen of lisinopril 5 mg and amlodipine 5 mg daily is not working as well as it should.  Please reach out to your primary care provider to discuss adjustment of your hypertension medications.  Thank you for visiting urgent care today.  I appreciate the opportunity to participate in your care.    This office note has been dictated using Museum/gallery curator.  Unfortunately, and despite my best efforts, this method of dictation can sometimes lead to occasional typographical or grammatical errors.  I apologize in advance if  this occurs.     Lynden Oxford Scales, PA-C 09/13/21 1448

## 2021-09-13 NOTE — ED Triage Notes (Signed)
Pt reports allergic reaction to Bactrim. Pt states she has been having itching, tingling, fainting, and fatigue. Pt also reports an RN called advising patient to discontinue taking medication.

## 2021-09-16 ENCOUNTER — Other Ambulatory Visit (HOSPITAL_COMMUNITY): Payer: Self-pay | Admitting: Cardiovascular Disease

## 2021-09-17 ENCOUNTER — Other Ambulatory Visit (HOSPITAL_COMMUNITY): Payer: Self-pay | Admitting: Cardiovascular Disease

## 2021-09-17 DIAGNOSIS — Z9862 Peripheral vascular angioplasty status: Secondary | ICD-10-CM

## 2021-09-17 DIAGNOSIS — I739 Peripheral vascular disease, unspecified: Secondary | ICD-10-CM

## 2021-09-24 ENCOUNTER — Ambulatory Visit (HOSPITAL_COMMUNITY)
Admission: RE | Admit: 2021-09-24 | Discharge: 2021-09-24 | Disposition: A | Discharge status: Home / Self Care | Payer: Medicare HMO | Source: Ambulatory Visit | Attending: Internal Medicine | Admitting: Internal Medicine

## 2021-09-24 ENCOUNTER — Other Ambulatory Visit: Payer: Self-pay

## 2021-09-24 DIAGNOSIS — I739 Peripheral vascular disease, unspecified: Secondary | ICD-10-CM | POA: Diagnosis not present

## 2021-09-24 DIAGNOSIS — Z9862 Peripheral vascular angioplasty status: Secondary | ICD-10-CM | POA: Diagnosis not present

## 2021-10-20 ENCOUNTER — Telehealth: Payer: Self-pay | Admitting: Cardiovascular Disease

## 2021-10-20 MED ORDER — AMLODIPINE BESYLATE 5 MG PO TABS: 5.0000 mg | ORAL_TABLET | Freq: Every day | ORAL | 2 refills | Status: DC

## 2021-10-20 NOTE — Telephone Encounter (Signed)
Patient called stating amLODipine (NORVASC) 5 MG tablet, need a prior auth.  ?

## 2021-10-20 NOTE — Telephone Encounter (Signed)
Spoke with patient of Dr. Gwenlyn Found  ?She does not need prior auth for amlodipine, she needs a refill  ?Rx(s) sent to pharmacy electronically. ? ?

## 2021-11-02 ENCOUNTER — Telehealth: Payer: Self-pay | Admitting: Cardiovascular Disease

## 2021-11-02 NOTE — Telephone Encounter (Signed)
Pt c/o medication issue: ? ?1. Name of Medication: clopidogrel (PLAVIX) 75 MG tablet ? ?2. How are you currently taking this medication (dosage and times per day)? As directed  ? ?3. Are you having a reaction (difficulty breathing--STAT)? no ? ?4. What is your medication issue? Patient wanted to know if this is a sulfate based drug. She had an allergic reaction to other sulfate drugs and just wants to double check ?

## 2021-11-02 NOTE — Telephone Encounter (Signed)
Spoke with pt regarding plavix. Pt would like to know if it is a sulfate containing medication because in the past she has had a reaction. Pt states that thus far she has not had a reaction to plavix. Pt just wanted to double check. Will send to PharmD to double check. ? ?

## 2021-11-03 MED ORDER — CLOPIDOGREL BISULFATE 75 MG PO TABS: 75.0000 mg | ORAL_TABLET | Freq: Every day | ORAL | 3 refills | Status: DC

## 2021-11-03 NOTE — Telephone Encounter (Signed)
There is no problem taking clopidogrel if you have a sulfa allergy, that is a different chemical structure and there is no cross reactivity.  ?

## 2021-11-03 NOTE — Telephone Encounter (Signed)
Left message on pt's voicemail stating that plavix is ok to take with sulfa allergy. Pt instructed to call back with any questions. Per previous discussion pt needs new prescription sent to her pharmacy of choice. Prescription sent. ?

## 2021-11-03 NOTE — Telephone Encounter (Addendum)
Patient returned call.  She leaves to go to work at 2:45pm.  ?

## 2021-11-03 NOTE — Telephone Encounter (Signed)
Patient informed of pharmd note and that prescription was sent to her pharmacy. ? ?Rockne Menghini, RPH-CPP  ? ?There is no problem taking clopidogrel if you have a sulfa allergy, that is a different chemical structure and there is no cross reactivity.   ?  ? ?

## 2022-01-01 IMAGING — MG MM DIGITAL DIAGNOSTIC UNILAT*R* W/ TOMO W/ CAD
6 series · 6 of 18 positions shown · non-contrast
Comparison: Previous exam(s).

CLINICAL DATA: Patient recalled from screening for right breast
masses.

EXAM:
DIGITAL DIAGNOSTIC UNILATERAL RIGHT MAMMOGRAM WITH TOMOSYNTHESIS AND
CAD; ULTRASOUND RIGHT BREAST LIMITED
TECHNIQUE: Right digital diagnostic mammography and breast tomosynthesis was
performed. The images were evaluated with computer-aided detection.;
Targeted ultrasound examination of the right breast was performed

[R CC synth-2D (1 of 2)]
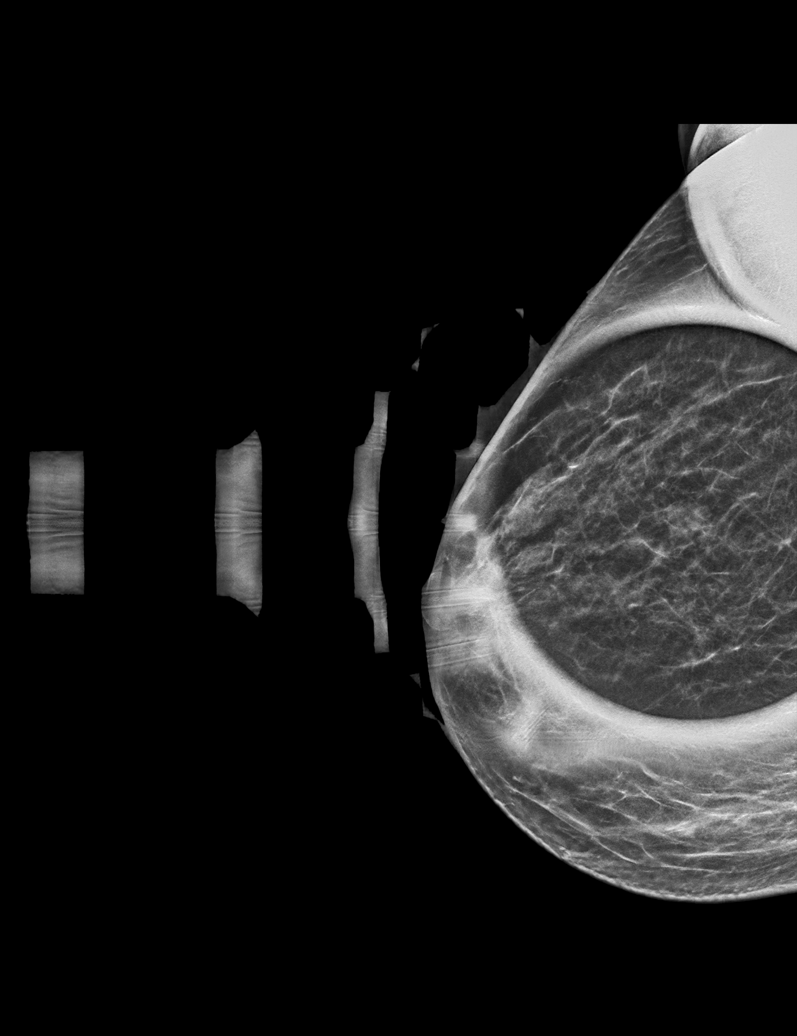

[R CC synth-2D (2 of 2)]
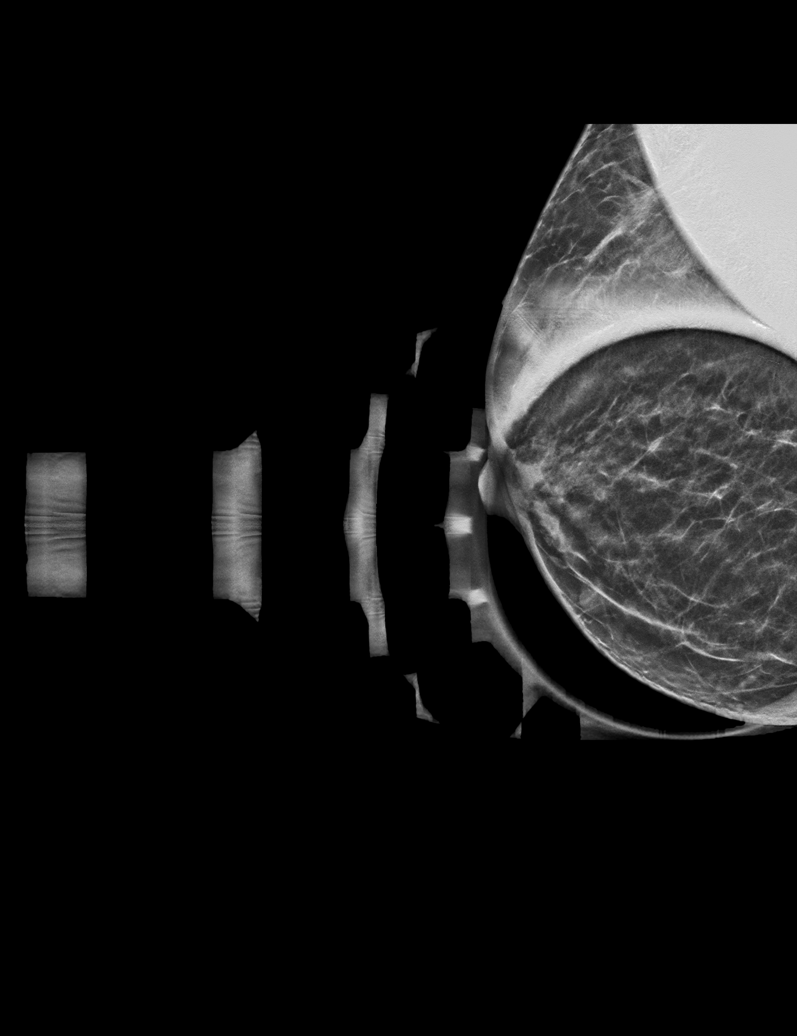

[R MLO synth-2D]
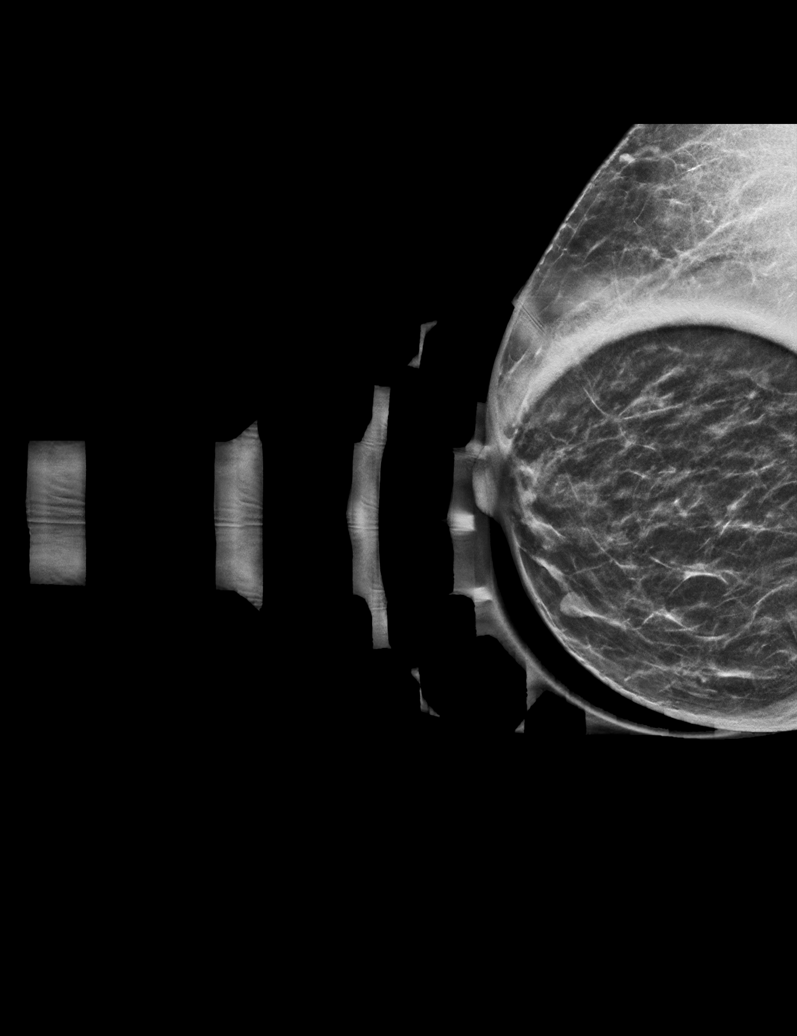

[R MLO tomo · tomo slice 21/42.0]
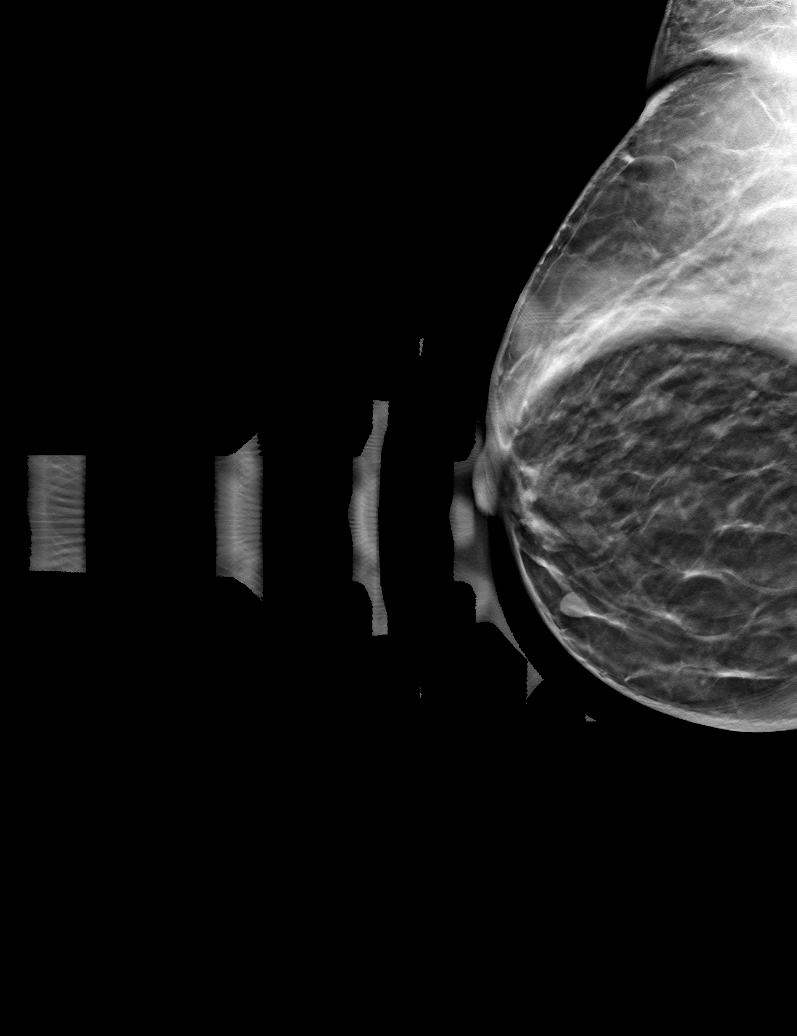

[R CC tomo (1 of 2) · tomo slice 22/43.0]
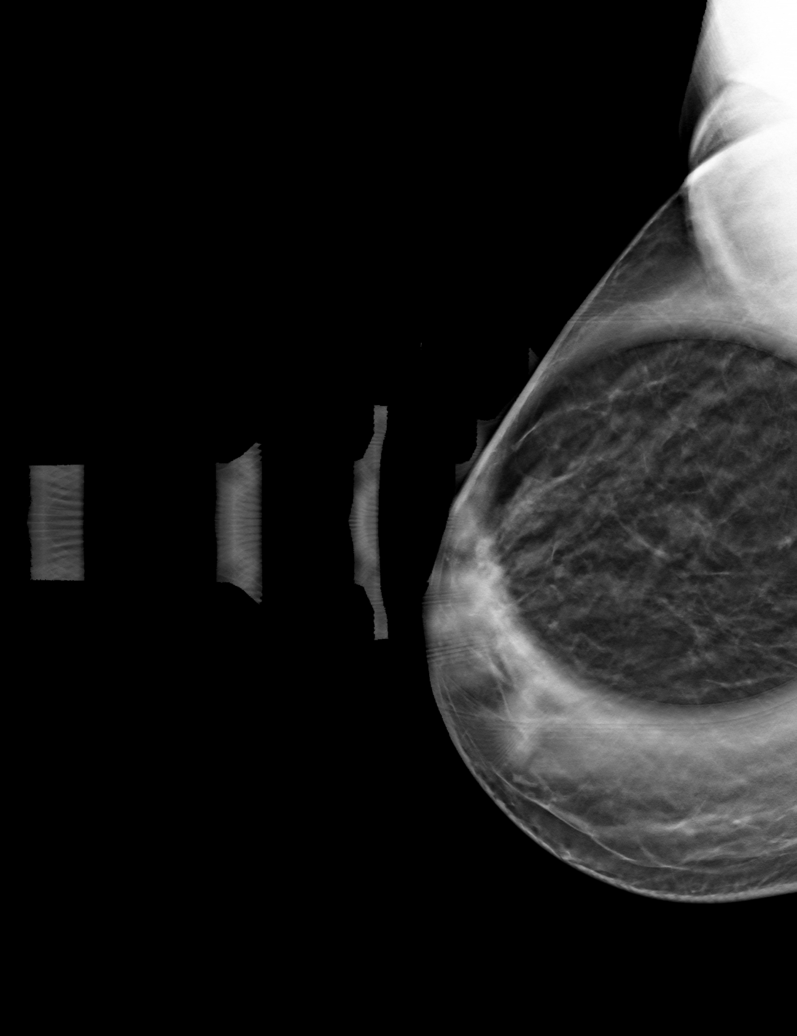

[R CC tomo (2 of 2) · tomo slice 20/39.0]
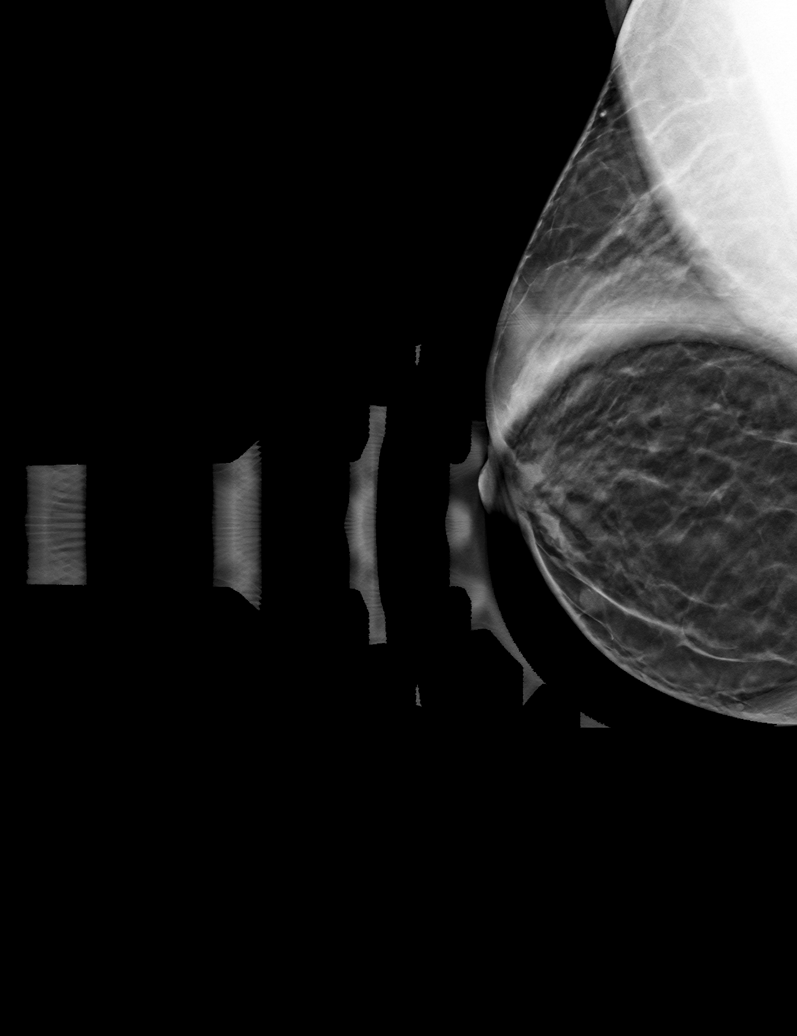

[6 of 18 positions shown; findings below may reference images not displayed]

ACR Breast Density Category c: The breast tissue is heterogeneously
dense, which may obscure small masses.
FINDINGS: Persistent oval circumscribed mass within the 4 o'clock position
anterior depth and 7 o'clock position middle depth of the right
breast.

Targeted ultrasound is performed, showing a 5 x 5 x 4 mm simple cyst
right breast 4 o'clock position 2 cm from nipple.

Within the right breast 7 o'clock position 3 cm from nipple there is
a 6 x 6 x 3 mm simple cyst.
IMPRESSION: Right breast cysts.  No mammographic evidence for malignancy.

RECOMMENDATION:
Screening mammogram in one year.(Code:[id number]-T-1FC)

I have discussed the findings and recommendations with the patient.
If applicable, a reminder letter will be sent to the patient
regarding the next appointment.

BI-RADS CATEGORY  2: Benign.

## 2022-01-29 ENCOUNTER — Other Ambulatory Visit: Payer: Self-pay | Admitting: Cardiovascular Disease

## 2022-02-01 ENCOUNTER — Other Ambulatory Visit: Payer: Self-pay | Admitting: Family Medicine

## 2022-02-01 DIAGNOSIS — Z1231 Encounter for screening mammogram for malignant neoplasm of breast: Secondary | ICD-10-CM

## 2022-02-10 DIAGNOSIS — I2510 Atherosclerotic heart disease of native coronary artery without angina pectoris: Secondary | ICD-10-CM | POA: Diagnosis not present

## 2022-02-10 DIAGNOSIS — D509 Iron deficiency anemia, unspecified: Secondary | ICD-10-CM | POA: Diagnosis not present

## 2022-02-10 DIAGNOSIS — F17200 Nicotine dependence, unspecified, uncomplicated: Secondary | ICD-10-CM | POA: Diagnosis not present

## 2022-02-10 DIAGNOSIS — R7303 Prediabetes: Secondary | ICD-10-CM | POA: Diagnosis not present

## 2022-02-10 DIAGNOSIS — I739 Peripheral vascular disease, unspecified: Secondary | ICD-10-CM | POA: Diagnosis not present

## 2022-02-10 DIAGNOSIS — K5900 Constipation, unspecified: Secondary | ICD-10-CM | POA: Diagnosis not present

## 2022-02-18 DIAGNOSIS — M25441 Effusion, right hand: Secondary | ICD-10-CM | POA: Diagnosis not present

## 2022-02-18 DIAGNOSIS — M542 Cervicalgia: Secondary | ICD-10-CM | POA: Diagnosis not present

## 2022-02-18 DIAGNOSIS — I739 Peripheral vascular disease, unspecified: Secondary | ICD-10-CM | POA: Diagnosis not present

## 2022-02-18 DIAGNOSIS — F17200 Nicotine dependence, unspecified, uncomplicated: Secondary | ICD-10-CM | POA: Diagnosis not present

## 2022-02-18 DIAGNOSIS — I2510 Atherosclerotic heart disease of native coronary artery without angina pectoris: Secondary | ICD-10-CM | POA: Diagnosis not present

## 2022-02-18 DIAGNOSIS — D509 Iron deficiency anemia, unspecified: Secondary | ICD-10-CM | POA: Diagnosis not present

## 2022-02-18 DIAGNOSIS — K5900 Constipation, unspecified: Secondary | ICD-10-CM | POA: Diagnosis not present

## 2022-02-18 DIAGNOSIS — R202 Paresthesia of skin: Secondary | ICD-10-CM | POA: Diagnosis not present

## 2022-02-18 DIAGNOSIS — I700 Atherosclerosis of aorta: Secondary | ICD-10-CM | POA: Diagnosis not present

## 2022-02-18 DIAGNOSIS — R7303 Prediabetes: Secondary | ICD-10-CM | POA: Diagnosis not present

## 2022-02-22 ENCOUNTER — Other Ambulatory Visit: Payer: Self-pay | Admitting: Family Medicine

## 2022-02-22 DIAGNOSIS — R202 Paresthesia of skin: Secondary | ICD-10-CM

## 2022-02-22 DIAGNOSIS — M542 Cervicalgia: Secondary | ICD-10-CM

## 2022-02-22 DIAGNOSIS — I700 Atherosclerosis of aorta: Secondary | ICD-10-CM

## 2022-02-22 DIAGNOSIS — F17200 Nicotine dependence, unspecified, uncomplicated: Secondary | ICD-10-CM

## 2022-02-22 DIAGNOSIS — M25471 Effusion, right ankle: Secondary | ICD-10-CM

## 2022-03-01 ENCOUNTER — Telehealth: Payer: Self-pay | Admitting: Cardiovascular Disease

## 2022-03-01 NOTE — Telephone Encounter (Signed)
*  STAT* If patient is at the pharmacy, call can be transferred to refill team.   1. Which medications need to be refilled? (please list name of each medication and dose if known) lisinopril (ZESTRIL) 5 MG tablet  2. Which pharmacy/location (including street and city if local pharmacy) is medication to be sent to? [place] ([place]), [place] [address]  3. Do they need a 30 day or 90 day supply? [place]

## 2022-03-02 MED ORDER — LISINOPRIL 5 MG PO TABS: 5.0000 mg | ORAL_TABLET | Freq: Every day | ORAL | 0 refills | Status: DC

## 2022-03-03 ENCOUNTER — Ambulatory Visit: Payer: Medicare HMO

## 2022-03-10 ENCOUNTER — Ambulatory Visit
Admission: RE | Admit: 2022-03-10 | Discharge: 2022-03-10 | Disposition: A | Discharge status: Home / Self Care | Payer: Medicare HMO | Source: Ambulatory Visit | Attending: Family Medicine | Admitting: Family Medicine

## 2022-03-10 DIAGNOSIS — Z1231 Encounter for screening mammogram for malignant neoplasm of breast: Secondary | ICD-10-CM | POA: Diagnosis not present

## 2022-03-17 ENCOUNTER — Ambulatory Visit
Admission: RE | Admit: 2022-03-17 | Discharge: 2022-03-17 | Disposition: A | Discharge status: Home / Self Care | Payer: Medicare HMO | Source: Ambulatory Visit | Attending: Family Medicine | Admitting: Family Medicine

## 2022-03-17 ENCOUNTER — Other Ambulatory Visit: Payer: Self-pay

## 2022-03-17 DIAGNOSIS — M4802 Spinal stenosis, cervical region: Secondary | ICD-10-CM | POA: Diagnosis not present

## 2022-03-17 DIAGNOSIS — F17200 Nicotine dependence, unspecified, uncomplicated: Secondary | ICD-10-CM

## 2022-03-17 DIAGNOSIS — R202 Paresthesia of skin: Secondary | ICD-10-CM | POA: Diagnosis not present

## 2022-03-17 DIAGNOSIS — M25471 Effusion, right ankle: Secondary | ICD-10-CM

## 2022-03-17 DIAGNOSIS — R200 Anesthesia of skin: Secondary | ICD-10-CM

## 2022-03-17 DIAGNOSIS — M542 Cervicalgia: Secondary | ICD-10-CM

## 2022-03-17 DIAGNOSIS — I700 Atherosclerosis of aorta: Secondary | ICD-10-CM

## 2022-03-17 NOTE — Patient Instructions (Signed)
Patient Goals/Self-Care Activities: Take all medications as prescribed Attend all scheduled provider appointments check blood pressure weekly write blood pressure results in a log or diary take blood pressure log to all doctor appointments call doctor for signs and symptoms of high blood pressure

## 2022-03-17 NOTE — Patient Outreach (Signed)
[facility]) Care Management  03/17/2022  Cassandra Santana 1950-08-07 [id number]   Telephone call to patient she is doing okay except for some tingling in her hands.  She states the doctor thinks she has a pinched nerve.  MRI schedule for tomorrow.  Discussed [facility] and services. Patient agreeable to nurse calls.  Care Plan : RN CM Plan of care  Updates made by Jon Billings, RN since 03/17/2022 12:00 AM     Problem: Chronic Disease Management and Care Coordination Needs HTN   Priority: High     Long-Range Goal: Development of Plan of Care for Management of HTN   Start Date: 03/17/2022  Expected End Date: 08/01/2022  Priority: High  Note:   Current Barriers:  Chronic Disease Management support and education needs related to HTN   RNCM Clinical Goal(s):  Patient will verbalize basic understanding of  HTN disease process and self health management plan as evidenced by Blood pressure less than 140/80  through collaboration with RN Care manager, provider, and care team.   Interventions: Education and support related to HTN Inter-disciplinary care team collaboration (see longitudinal plan of care) Evaluation of current treatment plan related to  self management and patient's adherence to plan as established by provider   Hypertension Interventions:  (Status:  New goal.) Long Term Goal Last practice recorded BP readings:  BP Readings from Last 3 Encounters:  09/13/21 (!) 173/73  09/07/21 (!) 149/86  05/20/21 124/66  Most recent eGFR/CrCl: No results found for: "EGFR"  No components found for: "CRCL"  Evaluation of current treatment plan related to hypertension self management and patient's adherence to plan as established by provider Reviewed medications with patient and discussed importance of compliance  Patient Goals/Self-Care Activities: Take all medications as prescribed Attend all scheduled provider appointments check blood pressure weekly write blood  pressure results in a log or diary take blood pressure log to all doctor appointments call doctor for signs and symptoms of high blood pressure  Follow Up Plan:  Telephone follow up appointment with care management team member scheduled for:  February The patient has been provided with contact information for the care management team and has been advised to call with any health related questions or concerns.      Plan: RN CM will provide ongoing education and support to patient through phone calls.   RN CM will send welcome packet with consent to patient.   RN CM will send initial barriers letter, assessment, and care plan to primary care physician.   RN CM will contact patient in February and patient agrees to next contact and care plan  Cassandra Baseman, RN, MSN [place] [facility] Care Management Coordinator Direct Line [phone] Toll Free: [phone]  Fax: [phone]

## 2022-04-01 ENCOUNTER — Other Ambulatory Visit: Payer: Self-pay

## 2022-04-01 NOTE — Patient Outreach (Signed)
[facility]) Care Management  04/01/2022  Cassandra Santana 11-18-1950 [id number]   RN CM closing case:  pt will be followed for care coordination by [place] [facility].  Jone Baseman, RN, MSN [facility] Care Management Care Management Coordinator Direct Line [phone] Toll Free: [phone]  Fax: [phone]

## 2022-04-28 DIAGNOSIS — M5412 Radiculopathy, cervical region: Secondary | ICD-10-CM | POA: Diagnosis not present

## 2022-05-12 DIAGNOSIS — M5412 Radiculopathy, cervical region: Secondary | ICD-10-CM | POA: Diagnosis not present

## 2022-05-12 DIAGNOSIS — M25512 Pain in left shoulder: Secondary | ICD-10-CM | POA: Diagnosis not present

## 2022-05-19 DIAGNOSIS — M5412 Radiculopathy, cervical region: Secondary | ICD-10-CM | POA: Diagnosis not present

## 2022-05-19 DIAGNOSIS — M25512 Pain in left shoulder: Secondary | ICD-10-CM | POA: Diagnosis not present

## 2022-05-25 ENCOUNTER — Telehealth: Payer: Self-pay | Admitting: Cardiovascular Disease

## 2022-05-25 MED ORDER — ATORVASTATIN CALCIUM 80 MG PO TABS: 80.0000 mg | ORAL_TABLET | Freq: Every day | ORAL | 2 refills | Status: DC

## 2022-05-25 NOTE — Telephone Encounter (Signed)
 *  STAT* If patient is at the pharmacy, call can be transferred to refill team.   1. Which medications need to be refilled? (please list name of each medication and dose if known) atorvastatin (LIPITOR) 80 MG tablet lisinopril (ZESTRIL) 5 MG tablet  2. Which pharmacy/location (including street and city if local pharmacy) is medication to be sent to? [place] ([place]), [place] [address]  3. Do they need a 30 day or 90 day supply? 90 days  Pt made an appt with Jory Sims on 07/09/22 1:30 pm

## 2022-05-26 DIAGNOSIS — M5412 Radiculopathy, cervical region: Secondary | ICD-10-CM | POA: Diagnosis not present

## 2022-05-26 DIAGNOSIS — M25512 Pain in left shoulder: Secondary | ICD-10-CM | POA: Diagnosis not present

## 2022-05-31 ENCOUNTER — Telehealth: Payer: Self-pay | Admitting: Cardiovascular Disease

## 2022-05-31 NOTE — Telephone Encounter (Signed)
*  STAT* If patient is at the pharmacy, call can be transferred to refill team.   1. Which medications need to be refilled? (please list name of each medication and dose if known)   lisinopril (ZESTRIL) 5 MG tablet  2. Which pharmacy/location (including street and city if local pharmacy) is medication to be sent to?  [place] ([place]), [place] [address]  3. Do they need a 30 day or 90 day supply?  90 day  Patient stated she is almost out of this medication.

## 2022-06-01 MED ORDER — LISINOPRIL 5 MG PO TABS: 5.0000 mg | ORAL_TABLET | Freq: Every day | ORAL | 0 refills | Status: DC

## 2022-06-02 DIAGNOSIS — M5412 Radiculopathy, cervical region: Secondary | ICD-10-CM | POA: Diagnosis not present

## 2022-06-02 DIAGNOSIS — M25512 Pain in left shoulder: Secondary | ICD-10-CM | POA: Diagnosis not present

## 2022-06-09 DIAGNOSIS — M5412 Radiculopathy, cervical region: Secondary | ICD-10-CM | POA: Diagnosis not present

## 2022-06-09 DIAGNOSIS — M25512 Pain in left shoulder: Secondary | ICD-10-CM | POA: Diagnosis not present

## 2022-06-16 DIAGNOSIS — M25512 Pain in left shoulder: Secondary | ICD-10-CM | POA: Diagnosis not present

## 2022-06-16 DIAGNOSIS — M5412 Radiculopathy, cervical region: Secondary | ICD-10-CM | POA: Diagnosis not present

## 2022-06-23 DIAGNOSIS — M25512 Pain in left shoulder: Secondary | ICD-10-CM | POA: Diagnosis not present

## 2022-06-23 DIAGNOSIS — M5412 Radiculopathy, cervical region: Secondary | ICD-10-CM | POA: Diagnosis not present

## 2022-06-30 DIAGNOSIS — M5412 Radiculopathy, cervical region: Secondary | ICD-10-CM | POA: Diagnosis not present

## 2022-07-07 DIAGNOSIS — M25512 Pain in left shoulder: Secondary | ICD-10-CM | POA: Diagnosis not present

## 2022-07-07 DIAGNOSIS — M5412 Radiculopathy, cervical region: Secondary | ICD-10-CM | POA: Diagnosis not present

## 2022-07-09 ENCOUNTER — Ambulatory Visit: Payer: Medicare HMO | Admitting: Adult Health

## 2022-07-14 DIAGNOSIS — M25512 Pain in left shoulder: Secondary | ICD-10-CM | POA: Diagnosis not present

## 2022-07-14 DIAGNOSIS — M5412 Radiculopathy, cervical region: Secondary | ICD-10-CM | POA: Diagnosis not present

## 2022-07-21 ENCOUNTER — Telehealth: Payer: Self-pay | Admitting: Cardiovascular Disease

## 2022-07-21 MED ORDER — AMLODIPINE BESYLATE 5 MG PO TABS: 5.0000 mg | ORAL_TABLET | Freq: Every day | ORAL | 0 refills | Status: DC

## 2022-07-21 NOTE — Telephone Encounter (Signed)
*  STAT* If patient is at the pharmacy, call can be transferred to refill team.   1. Which medications need to be refilled? (please list name of each medication and dose if known) amLODipine (NORVASC) 5 MG tablet  2. Which pharmacy/location (including street and city if local pharmacy) is medication to be sent to?  [place] ([place]), [place] [address]    3. Do they need a 30 day or 90 day supply? [place]

## 2022-08-31 ENCOUNTER — Telehealth: Payer: Self-pay | Admitting: Cardiovascular Disease

## 2022-08-31 NOTE — Telephone Encounter (Signed)
*  STAT* If patient is at the pharmacy, call can be transferred to refill team.   1. Which medications need to be refilled? (please list name of each medication and dose if known)   lisinopril (ZESTRIL) 5 MG tablet   2. Which pharmacy/location (including street and city if local pharmacy) is medication to be sent to?  [place] ([place]), [place] [address]   3. Do they need a 30 day or 90 day supply?  90 day  Patient stated she is almost out of this medication.

## 2022-09-01 MED ORDER — LISINOPRIL 5 MG PO TABS: 5.0000 mg | ORAL_TABLET | Freq: Every day | ORAL | 0 refills | Status: DC

## 2022-09-28 NOTE — Progress Notes (Signed)
Cardiology Clinic Note   Patient Name: Reynaldo Voltaire Date of Encounter: 10/01/2022  Primary Care Provider:  [name], DO Primary Cardiologist:  Quay Burow, MD  Patient [place] 72 year old female presents to the clinic today for follow-up evaluation of her coronary artery disease and peripheral arterial disease.  Past Medical History    Past Medical History:  Diagnosis Date   Allergies    Anemia    Arthritis    CAD (coronary artery disease)    Colitis    Colon polyp    High cholesterol    HTN (hypertension)    Hyperlipidemia    Hypertension    Internal hemorrhoids    NSTEMI (non-ST elevated myocardial infarction) ([facility]) 04/2018   PAD (peripheral artery disease) ([facility])    Rectal polyp    hyperplastic   Past Surgical History:  Procedure Laterality Date   ABDOMINAL AORTOGRAM W/LOWER EXTREMITY Bilateral 08/28/2018   Procedure: ABDOMINAL AORTOGRAM W/LOWER EXTREMITY;  Surgeon: [name], MD;  Location: [facility];  Service: Cardiovascular;  Laterality: Bilateral;   ABDOMINAL HYSTERECTOMY  1993   CORONARY STENT INTERVENTION N/A 04/06/2018   Procedure: CORONARY STENT INTERVENTION;  Surgeon: Sherren Mocha, MD;  Location: [facility];  Service: Cardiovascular;  Laterality: N/A;   LEFT HEART CATH AND CORONARY ANGIOGRAPHY N/A 04/06/2018   Procedure: LEFT HEART CATH AND CORONARY ANGIOGRAPHY;  Surgeon: Sherren Mocha, MD;  Location: [facility];  Service: Cardiovascular;  Laterality: N/A;   PERIPHERAL VASCULAR ATHERECTOMY Left 08/28/2018   Procedure: PERIPHERAL VASCULAR ATHERECTOMY;  Surgeon: [name], MD;  Location: [facility];  Service: Cardiovascular;  Laterality: Left;  SFA   ROTATOR CUFF REPAIR Right     Allergies  Allergies  Allergen Reactions   Bactrim [Sulfamethoxazole-Trimethoprim] Hives   Keflex [Cephalexin] Hives   Sulfa Antibiotics     "Blacked out" "almost Died"   Tomato Hives and  Itching    History of Present Illness     Eleyna Lisi has a PMH of coronary artery disease (NSTEMI) HTN, HLD, PAD, tobacco use, lower extremity claudication, and iron deficiency anemia.  She underwent cardiac catheterization 04/05/2018 by Dr. Burt Knack in the setting of NSTEMI.  She was noted to have high-grade circumflex lesion and received PCI with DES.  She was also noted to have disease in her proximal LAD and moderate disease in her PDA.  Her EF was normal.  She was placed on DAPT.  She reported bilateral lower extremity pain and underwent Doppler studies 08/04/2018.  She was noted to have right ABI of 0.71 and left ABI of 0.64 she was noted to have occluded distal right SFA and occluded left popliteal artery.  She reported smoking cessation the day after her NSTEMI.  She underwent angiography by [facility] 08/28/2018.  She was noted to have bilateral SFA CTO's with three-vessel runoff.  She underwent arthrectomy and had drug-coated balloon angioplasty of her left SFA with excellent result.  Her claudication resolved and her Doppler studies 09/15/2018 showed significant improvement.  She was seen in follow-up by Dr. Gwenlyn Found on 05/20/2021.  She remained stable from a cardiac standpoint.  She denied claudication.  She continued to smoke 7 to 8 cigarettes/day.  She denied chest pain and shortness of breath.  Her lower extremity arterial Dopplers 10/17/2019 showed right ABI of 0.71 and left 1.06 with widely patent left SFA.  She presents to the clinic today for follow-up evaluation  and states she feels well.  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  She denies lower extremity claudication.  We reviewed her previous catheterization and lower extremity interventions.  She expressed understanding.  She wishes to stop smoking.  I will prescribe nicotine patches.  We will repeat her ABIs and lower extremity arterial's.  Will plan follow-up in 12 months.  Today she denies chest pain, shortness of breath, lower extremity  edema, fatigue, palpitations, melena, hematuria, hemoptysis, diaphoresis, weakness, presyncope, syncope, orthopnea, and PND.    Home Medications    Prior to Admission medications   Medication Sig Start Date End Date Taking? Authorizing Provider  acetaminophen (TYLENOL) 500 MG tablet Take 1,000 mg by mouth daily as needed for moderate pain or headache.    [provider]  amLODipine (NORVASC) 5 MG tablet Take 1 tablet (5 mg total) by mouth daily. 07/21/22   [name], MD  aspirin EC 81 MG EC tablet Take 1 tablet (81 mg total) by mouth daily. 04/08/18   Cheryln Manly, NP  atorvastatin (LIPITOR) 80 MG tablet Take 1 tablet (80 mg total) by mouth daily at 6 PM. 05/25/22   [name], MD  clopidogrel (PLAVIX) 75 MG tablet Take 1 tablet (75 mg total) by mouth daily. 11/03/21   [name], MD  clotrimazole (LOTRIMIN) 1 % cream  10/22/19   [provider]  ferrous sulfate 325 (65 FE) MG EC tablet Take 1 tablet (325 mg total) by mouth 2 (two) times daily. 02/27/20   Irene Shipper, MD  lisinopril (ZESTRIL) 5 MG tablet Take 1 tablet (5 mg total) by mouth daily. KEEP UPCOMING APPOINTMENT  FOR FUTURE REFILLS. 09/01/22   [name], MD  nitroGLYCERIN (NITROSTAT) 0.4 MG SL tablet Place 1 tablet (0.4 mg total) under the tongue every 5 (five) minutes x 3 doses as needed for chest pain. 08/11/21   [name], MD  pantoprazole (PROTONIX) 40 MG tablet Take 1 tablet (40 mg total) by mouth daily. 06/30/20   Irene Shipper, MD    Family History    Family History  Problem Relation Age of Onset   Kidney failure Mother    Diabetes Mother    Heart disease Mother    Heart attack Father    Heart disease Father    Diabetes Sister    Heart disease Sister    Pancreatic cancer Maternal Grandmother    Colon cancer Neg Hx    Colon polyps Neg Hx    Esophageal cancer Neg Hx    Rectal cancer Neg Hx    Stomach cancer Neg Hx    She indicated that her mother is deceased.  She indicated that her father is deceased. She indicated that the status of her sister is unknown. She indicated that her brother is deceased. She indicated that the status of her maternal grandmother is unknown. She indicated that the status of her neg hx is unknown.  Social History    Social History   Socioeconomic History   Marital status: Legally Separated    Spouse name: Not on file   Number of children: Not on file   Years of education: Not on file   Highest education level: Not on file  Occupational History   Occupation: CNA    Employer: [name]  Tobacco Use   Smoking status: Every Day    Packs/day: 0.50    Types: Cigarettes    Last attempt to quit: 04/09/2018  Years since quitting: 4.4   Smokeless tobacco: Never  Vaping Use   Vaping Use: Never used  Substance and Sexual Activity   Alcohol use: No   Drug use: Yes    Types: Marijuana    Comment: LAST USED 02/25/20 '@1800'$    Sexual activity: Yes    Birth control/protection: Surgical  Other Topics Concern   Not on file  Social History Narrative   Not on file   Social Determinants of Health   Financial Resource Strain: Not on file  Food Insecurity: Not on file  Transportation Needs: No Transportation Needs (03/17/2022)   PRAPARE - Hydrologist (Medical): No    Lack of Transportation (Non-Medical): No  Physical Activity: Not on file  Stress: Not on file  Social Connections: Not on file  Intimate Partner Violence: Not on file     Review of Systems    General:  No chills, fever, night sweats or weight changes.  Cardiovascular:  No chest pain, dyspnea on exertion, edema, orthopnea, palpitations, paroxysmal nocturnal dyspnea. Dermatological: No rash, lesions/masses Respiratory: No cough, dyspnea Urologic: No hematuria, dysuria Abdominal:   No nausea, vomiting, diarrhea, bright red blood per rectum, melena, or hematemesis Neurologic:  No visual changes, wkns, changes in mental  status. All other systems reviewed and are otherwise negative except as noted above.  Physical Exam    VS:  BP 124/62   Pulse 66   Ht '5\' 3"'$  (1.6 m)   Wt 136 lb 9.6 oz (62 kg)   SpO2 97%   BMI 24.20 kg/m  , BMI Body mass index is 24.2 kg/m. GEN: Well nourished, well developed, in no acute distress. HEENT: normal. Neck: Supple, no JVD, carotid bruits, or masses. Cardiac: RRR, no murmurs, rubs, or gallops. No clubbing, cyanosis, edema.  DP/PT 2+ Left and 1+ Right.  Respiratory:  Respirations regular and unlabored, clear to auscultation bilaterally. GI: Soft, nontender, nondistended, BS + x 4. MS: no deformity or atrophy. Skin: warm and dry, no rash. Neuro:  Strength and sensation are intact. Psych: Normal affect.  Accessory Clinical Findings    Recent Labs: No results found for requested labs within last 365 days.   Recent Lipid Panel    Component Value Date/Time   CHOL 124 11/12/2020 1357   TRIG 114 11/12/2020 1357   HDL 42 11/12/2020 1357   CHOLHDL 3.0 11/12/2020 1357   CHOLHDL 5.2 04/06/2018 0455   VLDL 34 04/06/2018 0455   LDLCALC 61 11/12/2020 1357         ECG personally reviewed by me today-sinus rhythm with premature atrial complexes possible left atrial enlargement septal infarct undetermined age 39 bpm- No acute changes  ABIs 09/24/2021    Bilateral ABIs appear essentially unchanged compared to prior study on  09/18/2020. Bilateral TBIs appear increased compared to prior study on  09/18/2020.    Summary:  Right: Resting right ankle-brachial index indicates moderate right lower  extremity arterial disease. The right toe-brachial index is normal.   Left: Resting left ankle-brachial index is within normal range. No  evidence of significant left lower extremity arterial disease. The left  toe-brachial index is normal.      *See table(s) above for measurements and observations.  See LE Arterial duplex report.   Suggest follow up study in 12 months.    Electronically signed by Carlyle Dolly MD on 09/25/2021 at 7:09:25 PM.         Final     Assessment & Plan  1.  Coronary artery disease-no chest pain today.  Underwent PCI with DES 9/19. Continue amlodipine, atorvastatin, clopidogrel, lisinopril, nitroglycerin as needed Heart healthy low-sodium diet-salty 6 given Increase physical activity as tolerated  Peripheral arterial disease-denies claudication.  Continues to smoke.  ABIs 09/24/2021 showed right moderate arterial disease and no evidence of significant arterial disease on the left. Repeat lower extremity arterial's and ABIs Continue clopidogrel, atorvastatin  Hyperlipidemia-LDL 61 on 11/12/20 Continue atorvastatin, aspirin, clopidogrel Heart healthy low-sodium high-fiber diet Increase physical activity as tolerated  Tobacco abuse-continues to smoke.  Would like to stop smoking. Start nicotine patches 21 mg weeks 1-6, 14 mg weeks 7-8, 7 mg weeks 9 and 10 Smoking cessation information given  Disposition: Follow-up with Dr.Berry in 12 months.   Jossie Ng. Erandi Lemma NP-C     10/01/2022, 3:33 PM [facility] [address] Office [phone] Fax [phone]    I spent 14 minutes examining this patient, reviewing medications, and using patient centered shared decision making involving her cardiac care.  Prior to her visit I spent greater than 20 minutes reviewing her past medical history,  medications, and prior cardiac tests.

## 2022-10-01 ENCOUNTER — Ambulatory Visit: Payer: Medicare HMO | Attending: Adult Health | Admitting: General Practice

## 2022-10-01 ENCOUNTER — Encounter: Payer: Self-pay | Admitting: General Practice

## 2022-10-01 VITALS — BP 124/62 | HR 66 | Ht 63.0 in | Wt 136.6 lb

## 2022-10-01 DIAGNOSIS — I2510 Atherosclerotic heart disease of native coronary artery without angina pectoris: Secondary | ICD-10-CM | POA: Diagnosis not present

## 2022-10-01 DIAGNOSIS — E782 Mixed hyperlipidemia: Secondary | ICD-10-CM

## 2022-10-01 DIAGNOSIS — I739 Peripheral vascular disease, unspecified: Secondary | ICD-10-CM

## 2022-10-01 MED ORDER — NICOTINE 14 MG/24HR TD PT24: 14.0000 mg | MEDICATED_PATCH | Freq: Every day | TRANSDERMAL | 0 refills | Status: DC

## 2022-10-01 MED ORDER — NICOTINE 7 MG/24HR TD PT24: 7.0000 mg | MEDICATED_PATCH | Freq: Every day | TRANSDERMAL | 0 refills | Status: DC

## 2022-10-01 MED ORDER — NICOTINE 21 MG/24HR TD PT24: 21.0000 mg | MEDICATED_PATCH | Freq: Every day | TRANSDERMAL | 6 refills | Status: DC

## 2022-10-01 NOTE — Patient Instructions (Addendum)
Medication Instructions:  USE NICODERM PATCHES '21MG'$  FOR WEEKS 1-6 THEN USE NICODERM PATCHES '14MG'$  FOR WEEKS 7-8 THEN USE NICODERM PATCHES '7MG'$  FOR WEEKS 9-10 THEN DONE *If you need a refill on your cardiac medications before your next appointment, please call your pharmacy*  Lab Work: NONE If you have labs (blood work) drawn today and your tests are completely normal, you will receive your results only by: [place] (if you have MyChart) OR  A paper copy in the mail If you have any lab test that is abnormal or we need to change your treatment, we will call you to review the results.  Testing/Procedures: Your physician has requested that you have an ankle brachial index (ABI). During this test an ultrasound and blood pressure cuff are used to evaluate the arteries that supply the arms and legs with blood. Allow thirty minutes for this exam. There are no restrictions or special instructions.  Your physician has requested that you have a lower extremity arterial.  During this test, exercise and ultrasound are used to evaluate arterial blood flow in the legs. Allow one hour for this exam. There are no restrictions or special instructions.  Follow-Up: At [facility], you and your health needs are our priority.  As part of our continuing mission to provide you with exceptional heart care, we have created designated Provider Care Teams.  These Care Teams include your primary Cardiologist (physician) and Advanced Practice Providers (APPs -  Physician Assistants and Nurse Practitioners) who all work together to provide you with the care you need, when you need it.  We recommend signing up for the patient portal called "MyChart".  Sign up information is provided on this After Visit Summary.  MyChart is used to connect with patients for Virtual Visits (Telemedicine).  Patients are able to view lab/test results, encounter notes, upcoming appointments, etc.  Non-urgent messages can be sent to  your provider as well.   To learn more about what you can do with MyChart, go to [email or web address].    Your next appointment:   12 month(s)  Provider:   Quay Burow, MD     Other Instructions INCREASE PHYSICAL ACTIVITY-AS TOLERATED  SMOKING CESSATION TIPS-ATTACHED  PLEASE READ AND FOLLOW ATTACHED  SALTY 6            Steps to Quit Smoking Smoking tobacco is the leading cause of preventable death. It can affect almost every organ in the body. Smoking puts you and people around you at risk for many serious, long-lasting (chronic) diseases. Quitting smoking can be hard, but it is one of the best things that you can do for your health. It is never too late to quit. Do not give up if you cannot quit the first time. Some people need to try many times to quit. Do your best to stick to your quit plan, and talk with your doctor if you have any questions or concerns. How do I get ready to quit? Pick a date to quit. Set a date within the next 2 weeks to give you time to prepare. Write down the reasons why you are quitting. Keep this list in places where you will see it often. Tell your family, friends, and co-workers that you are quitting. Their support is important. Talk with your doctor about the choices that may help you quit. Find out if your health insurance will pay for these treatments. Know the people, places, things, and activities that make you want to smoke (triggers). Avoid them.  What first steps can I take to quit smoking? Throw away all cigarettes at home, at work, and in your car. Throw away the things that you use when you smoke, such as ashtrays and lighters. Clean your car. Empty the ashtray. Clean your home, including curtains and carpets. What can I do to help me quit smoking? Talk with your doctor about taking medicines and seeing a counselor. You are more likely to succeed when you do both. If you are pregnant or breastfeeding: Talk with your doctor about  counseling or other ways to quit smoking. Do not take medicine to help you quit smoking unless your doctor tells you to. Quit right away Quit smoking completely, instead of slowly cutting back on how much you smoke over a period of time. Stopping smoking right away may be more successful than slowly quitting. Go to counseling. In-person is best if this is an option. You are more likely to quit if you go to counseling sessions regularly. Take medicine You may take medicines to help you quit. Some medicines need a prescription, and some you can buy over-the-counter. Some medicines may contain a drug called nicotine to replace the nicotine in cigarettes. Medicines may: Help you stop having the desire to smoke (cravings). Help to stop the problems that come when you stop smoking (withdrawal symptoms). Your doctor may ask you to use: Nicotine patches, gum, or lozenges. Nicotine inhalers or sprays. Non-nicotine medicine that you take by mouth. Find resources Find resources and other ways to help you quit smoking and remain smoke-free after you quit. They include: Online chats with a Social worker. Phone quitlines. Printed Furniture conservator/restorer. Support groups or group counseling. Text messaging programs. Mobile phone apps. Use apps on your mobile phone or tablet that can help you stick to your quit plan. Examples of free services include Quit Guide from the CDC and smokefree.gov  What can I do to make it easier to quit?  Talk to your family and friends. Ask them to support and encourage you. Call a phone quitline, such as 1-800-QUIT-NOW, reach out to support groups, or work with a Social worker. Ask people who smoke to not smoke around you. Avoid places that make you want to smoke, such as: Bars. Parties. Smoke-break areas at work. Spend time with people who do not smoke. Lower the stress in your life. Stress can make you want to smoke. Try these things to lower stress: Getting regular  exercise. Doing deep-breathing exercises. Doing yoga. Meditating. What benefits will I see if I quit smoking? Over time, you may have: A better sense of smell and taste. Less coughing and sore throat. A slower heart rate. Lower blood pressure. Clearer skin. Better breathing. Fewer sick days. Summary Quitting smoking can be hard, but it is one of the best things that you can do for your health. Do not give up if you cannot quit the first time. Some people need to try many times to quit. When you decide to quit smoking, make a plan to help you succeed. Quit smoking right away, not slowly over a period of time. When you start quitting, get help and support to keep you smoke-free. This information is not intended to replace advice given to you by your health care provider.

## 2022-10-18 ENCOUNTER — Telehealth: Payer: Self-pay | Admitting: Cardiovascular Disease

## 2022-10-18 NOTE — Telephone Encounter (Signed)
?*  STAT* If patient is at the pharmacy, call can be transferred to refill team. ? ? ?1. Which medications need to be refilled? (please list name of each medication and dose if known) amLODipine (NORVASC) 5 MG tablet ? ?2. Which pharmacy/location (including street and city if local pharmacy) is medication to be sent to? [facility] - [place] ([place]), [place] - [address] ? ?3. Do they need a 30 day or 90 day supply? 90 day ? ? ? ?

## 2022-10-19 MED ORDER — AMLODIPINE BESYLATE 5 MG PO TABS: 5.0000 mg | ORAL_TABLET | Freq: Every day | ORAL | 3 refills | Status: DC

## 2022-10-20 ENCOUNTER — Ambulatory Visit (HOSPITAL_COMMUNITY)
Admission: RE | Admit: 2022-10-20 | Discharge: 2022-10-20 | Disposition: A | Discharge status: Home / Self Care | Payer: Medicare HMO | Source: Ambulatory Visit | Attending: General Practice | Admitting: General Practice

## 2022-10-20 DIAGNOSIS — I739 Peripheral vascular disease, unspecified: Secondary | ICD-10-CM | POA: Insufficient documentation

## 2022-10-20 LAB — VAS US ABI WITH/WO TBI
Left ABI: 1.06
Right ABI: 0.75

## 2022-10-21 ENCOUNTER — Telehealth: Payer: Self-pay | Admitting: General Practice

## 2022-10-21 NOTE — Telephone Encounter (Signed)
Patient aware of results and verbalized understanding. No questions at this time.

## 2022-10-21 NOTE — Telephone Encounter (Signed)
?  Pt is returning call to get result ?

## 2022-10-25 ENCOUNTER — Telehealth: Payer: Self-pay | Admitting: Cardiovascular Disease

## 2022-10-25 NOTE — Telephone Encounter (Signed)
Pt c/o medication issue:  1. Name of Medication: amLODipine (NORVASC) 5 MG tablet   2. How are you currently taking this medication (dosage and times per day)? Take 1 tablet (5 mg total) by mouth daily.  3. Are you having a reaction (difficulty breathing--STAT)? No   4. What is your medication issue? Patient said that they have mixed up her medication. She takes the Amlodipine 5 MG LEG but they gave her Amlodipine 5 MG UMI or UML. Doesn't have same Rx number. Please call to discuss

## 2022-10-25 NOTE — Telephone Encounter (Signed)
Call placed to [place]. Was notified it is a Risk analyst from last fill in December but that Rx itself is the same. Relayed this to patient. She expressed continued concern. Advised she should call [facility] if she has questions as they are in control of the dispensing of the med.

## 2022-11-08 ENCOUNTER — Telehealth: Payer: Self-pay | Admitting: Cardiovascular Disease

## 2022-11-08 NOTE — Telephone Encounter (Signed)
*  STAT* If patient is at the pharmacy, call can be transferred to refill team.   1. Which medications need to be refilled? (please list name of each medication and dose if known)   clopidogrel (PLAVIX) 75 MG tablet    2. Which pharmacy/location (including street and city if local pharmacy) is medication to be sent to?   [facility] - [place] ([place]), [place] - [address]    3. Do they need a 30 day or 90 day supply? 90

## 2022-11-10 ENCOUNTER — Other Ambulatory Visit: Payer: Self-pay | Admitting: Cardiovascular Disease

## 2022-11-10 NOTE — Telephone Encounter (Signed)
Patient called stating her pharmacy told her they had not received the refill request and wants the prescription resent.

## 2022-11-11 NOTE — Telephone Encounter (Signed)
Medication was refilled yesterday after 4pm by another CMA in the office

## 2022-11-27 ENCOUNTER — Other Ambulatory Visit: Payer: Self-pay | Admitting: Cardiovascular Disease

## 2022-11-29 ENCOUNTER — Telehealth: Payer: Self-pay | Admitting: Cardiovascular Disease

## 2022-11-29 NOTE — Telephone Encounter (Signed)
*  STAT* If patient is at the pharmacy, call can be transferred to refill team.   1. Which medications need to be refilled? (please list name of each medication and dose if known)   lisinopril (ZESTRIL) 5 MG tablet    2. Which pharmacy/location (including street and city if local pharmacy) is medication to be sent to?  [facility] - [place] ([place]), [place] - [address]    3. Do they need a 30 day or 90 day supply? 90  Patient was seen on 10/01/22

## 2022-12-08 DIAGNOSIS — M5030 Other cervical disc degeneration, unspecified cervical region: Secondary | ICD-10-CM | POA: Diagnosis not present

## 2022-12-08 DIAGNOSIS — G4700 Insomnia, unspecified: Secondary | ICD-10-CM | POA: Diagnosis not present

## 2023-01-13 ENCOUNTER — Encounter: Payer: Self-pay | Admitting: Internal Medicine

## 2023-02-07 ENCOUNTER — Telehealth: Payer: Self-pay | Admitting: Cardiovascular Disease

## 2023-02-07 DIAGNOSIS — I214 Non-ST elevation (NSTEMI) myocardial infarction: Secondary | ICD-10-CM

## 2023-02-07 DIAGNOSIS — I2510 Atherosclerotic heart disease of native coronary artery without angina pectoris: Secondary | ICD-10-CM

## 2023-02-07 NOTE — Telephone Encounter (Signed)
° ° °*  STAT* If patient is at the pharmacy, call can be transferred to refill team. ° ° °1. Which medications need to be refilled? (please list name of each medication and dose if known)  ° clopidogrel (PLAVIX) 75 MG tablet  ° ° °2. Which pharmacy/location (including street and city if local pharmacy) is medication to be sent to? [facility] - [place] ([place]), [place] - [address] ° °3. Do they need a 30 day or 90 day supply? 90 days ° ° °

## 2023-02-08 MED ORDER — CLOPIDOGREL BISULFATE 75 MG PO TABS: 75.0000 mg | ORAL_TABLET | Freq: Every day | ORAL | 0 refills | Status: DC

## 2023-02-15 ENCOUNTER — Encounter: Payer: Self-pay | Admitting: Nurse Practitioner

## 2023-02-28 ENCOUNTER — Telehealth: Payer: Self-pay | Admitting: Emergency Medicine

## 2023-02-28 MED ORDER — ATORVASTATIN CALCIUM 80 MG PO TABS: 80.0000 mg | ORAL_TABLET | Freq: Every day | ORAL | 3 refills | Status: DC

## 2023-02-28 NOTE — Telephone Encounter (Signed)
Pt c/o medication issue:  1. Name of Medication:   atorvastatin (LIPITOR) 80 MG tablet    2. How are you currently taking this medication (dosage and times per day)? As prescribed   3. Are you having a reaction (difficulty breathing--STAT)?   4. What is your medication issue? Patient was told by pharmacy that this medication needed to be authorized.

## 2023-02-28 NOTE — Telephone Encounter (Signed)
Patient needs new RX.  Refill sent and notified patient

## 2023-03-01 ENCOUNTER — Other Ambulatory Visit: Payer: Self-pay | Admitting: Family Medicine

## 2023-03-01 DIAGNOSIS — Z1231 Encounter for screening mammogram for malignant neoplasm of breast: Secondary | ICD-10-CM

## 2023-03-16 ENCOUNTER — Ambulatory Visit: Payer: Medicare HMO

## 2023-03-16 ENCOUNTER — Ambulatory Visit
Admission: RE | Admit: 2023-03-16 | Discharge: 2023-03-16 | Disposition: A | Discharge status: Home / Self Care | Payer: Medicare HMO | Source: Ambulatory Visit | Attending: Family Medicine | Admitting: Family Medicine

## 2023-03-16 DIAGNOSIS — Z1231 Encounter for screening mammogram for malignant neoplasm of breast: Secondary | ICD-10-CM | POA: Diagnosis not present

## 2023-04-06 ENCOUNTER — Telehealth: Payer: Self-pay | Admitting: Cardiovascular Disease

## 2023-04-06 MED ORDER — NITROGLYCERIN 0.4 MG SL SUBL: 0.4000 mg | SUBLINGUAL_TABLET | SUBLINGUAL | 6 refills | Status: AC | PRN

## 2023-04-06 NOTE — Telephone Encounter (Signed)
Pt's medication was sent to pt's pharmacy as requested. Confirmation received.  °

## 2023-04-06 NOTE — Telephone Encounter (Signed)
*  STAT* If patient is at the pharmacy, call can be transferred to refill team.   1. Which medications need to be refilled? (please list name of each medication and dose if known) nitroGLYCERIN (NITROSTAT) 0.4 MG SL tablet   2. Which pharmacy/location (including street and city if local pharmacy) is medication to be sent to?  [facility] - [place] ([place]), [place] - [address]      3. Do they need a 30 day or 90 day supply? 90 day     Pt is completely out of medication

## 2023-05-11 ENCOUNTER — Other Ambulatory Visit: Payer: Medicare HMO

## 2023-05-11 ENCOUNTER — Telehealth: Payer: Self-pay

## 2023-05-11 ENCOUNTER — Encounter: Payer: Self-pay | Admitting: Nurse Practitioner

## 2023-05-11 ENCOUNTER — Ambulatory Visit: Payer: Medicare HMO | Admitting: Nurse Practitioner

## 2023-05-11 VITALS — BP 102/62 | HR 84 | Ht 63.0 in | Wt 144.2 lb

## 2023-05-11 DIAGNOSIS — R198 Other specified symptoms and signs involving the digestive system and abdomen: Secondary | ICD-10-CM

## 2023-05-11 DIAGNOSIS — Z862 Personal history of diseases of the blood and blood-forming organs and certain disorders involving the immune mechanism: Secondary | ICD-10-CM

## 2023-05-11 DIAGNOSIS — Z860100 Personal history of colon polyps, unspecified: Secondary | ICD-10-CM

## 2023-05-11 DIAGNOSIS — Z7901 Long term (current) use of anticoagulants: Secondary | ICD-10-CM

## 2023-05-11 LAB — CBC
HCT: 44.1 % (ref 36.0–46.0)
Hemoglobin: 14 g/dL (ref 12.0–15.0)
MCHC: 31.8 g/dL (ref 30.0–36.0)
MCV: 93.5 fL (ref 78.0–100.0)
Platelets: 326 10*3/uL (ref 150.0–400.0)
RBC: 4.72 Mil/uL (ref 3.87–5.11)
RDW: 17.3 % — ABNORMAL HIGH (ref 11.5–15.5)
WBC: 6.3 10*3/uL (ref 4.0–10.5)

## 2023-05-11 LAB — HEPATIC FUNCTION PANEL
ALT: 16 U/L (ref 0–35)
AST: 17 U/L (ref 0–37)
Albumin: 4.2 g/dL (ref 3.5–5.2)
Alkaline Phosphatase: 88 U/L (ref 39–117)
Bilirubin, Direct: 0.1 mg/dL (ref 0.0–0.3)
Total Bilirubin: 0.4 mg/dL (ref 0.2–1.2)
Total Protein: 6.8 g/dL (ref 6.0–8.3)

## 2023-05-11 LAB — LIPASE: Lipase: 35 U/L (ref 11.0–59.0)

## 2023-05-11 MED ORDER — NA SULFATE-K SULFATE-MG SULF 17.5-3.13-1.6 GM/177ML PO SOLN: 1.0000 | Freq: Once | ORAL | 0 refills | Status: AC

## 2023-05-11 NOTE — Telephone Encounter (Signed)
[facility] Pre-operative Risk Assessment     Request for surgical clearance:     Endoscopy Procedure  What type of surgery is being performed?     Colonoscopy  When is this surgery scheduled?     08-10-2023  What type of clearance is required ?   Pharmacy  Are there any medications that need to be held prior to surgery and how long? Plavix 5 day hold  Practice name and name of physician performing surgery?      [facility]  What is your office phone and fax number?      Phone- [phone]  Fax- [phone]  Anesthesia type (None, local, MAC, general) ?       MAC

## 2023-05-11 NOTE — Patient Instructions (Addendum)
_______________________________________________________  If your blood pressure at your visit was 140/90 or greater, please contact your primary care physician to follow up on this.  _______________________________________________________  If you are age 71 or older, your body mass index should be between 23-30. Your Body mass index is 25.55 kg/m. If this is out of the aforementioned range listed, please consider follow up with your Primary Care Provider.  If you are age 65 or younger, your body mass index should be between 19-25. Your Body mass index is 25.55 kg/m. If this is out of the aformentioned range listed, please consider follow up with your Primary Care Provider.   ________________________________________________________  The [facility] providers would like to encourage you to use [facility] to communicate with providers for non-urgent requests or questions.  Due to long hold times on the telephone, sending your provider a message by [facility] may be a faster and more efficient way to get a response.  Please allow 48 business hours for a response.  Please remember that this is for non-urgent requests.  _______________________________________________________  _______________________________________________________  Your provider has requested that you go to the basement level for lab work before leaving today. Press "B" on the elevator. The lab is located at the first door on the left as you exit the elevator.  Hold Iron 1 week prior to your procedure date   You will be contacted by our office prior to your procedure for directions on holding your Plavix.  If you do not hear from our office 2 week prior to your scheduled procedure, please call [phone] to discuss.   You have been scheduled for an abdominal ultrasound at [facility] Main Entrance A on 05-17-2023 at 8am. Please arrive 30 minutes prior to your appointment for registration. Make certain not to have anything to  eat or drink midnight prior to your appointment. Should you need to reschedule your appointment, please contact radiology at [phone]. This test typically takes about 30 minutes to perform.  We have sent the following medications to your pharmacy for you to pick up at your convenience: Suprep  You have been scheduled for a colonoscopy. Please follow written instructions given to you at your visit today.   Please pick up your prep supplies at the pharmacy within the next 1-3 days.  If you use inhalers (even only as needed), please bring them with you on the day of your procedure.  DO NOT TAKE 7 DAYS PRIOR TO TEST- Trulicity (dulaglutide) Ozempic, Wegovy (semaglutide) Mounjaro (tirzepatide) Bydureon Bcise (exanatide extended release)  DO NOT TAKE 1 DAY PRIOR TO YOUR TEST Rybelsus (semaglutide) Adlyxin (lixisenatide) Victoza (liraglutide) Byetta (exanatide) ___________________________________________________________________________  It was a pleasure to see you today!  Thank you for trusting me with your gastrointestinal care!

## 2023-05-11 NOTE — Progress Notes (Signed)
ASSESSMENT & PLAN   72 y.o. yo female known to Dr Marina Goodell with a history of iron deficiency anemia, erosive duodenitis, recurrent colon polyps, PAD, CAD / NSTEMI on plavix, tobacco abuse  History of colon polyps ( 2021).  Multiple adenomatous colon polyps ranging from 2-15 mm ( 2021).  Due for surveillance colonoscopy.  --Schedule for a surveillance  colonoscopy. The risks and benefits of colonoscopy with possible polypectomy / biopsies were discussed and the patient agrees to proceed.   Blood in stool ( small volume).  Probably hemorrhoidal bleeding  --Further evaluation at time of colonoscopy   Abnormal abdominal exam.  Upper abdominal tenderness to palpation. Mid upper abdominal fullness. No associated N/V or weight loss.  --Lipase, CBC, CMET --Abdominal US  History of IDA, evaluated by [place] in 2021.  No recent labs or Hematology follow up. Remains on daily iron CBC today   HPI   Chief complaint :  history of colon polyps  Cassandra Santana has no complaints. She does endorse some red blood in stool over the last month . No rectal pain. She sometimes strains to have a BM. Takes Metamucil and / or a laxative as needed.  On plavix and a daily baby asa. No other NSAIDS.   Black pepper gives her heartburn . TUMS help but most of the time she just puts up with it.   Still taking BID oral iron. Hasn't been back to the Hematologist for IV iron in a couple of years. She hasn't had any labs done for anemia in a long time. Feels cold all the time.   Cassandra Santana gained 10 pounds after starting lyrica. Has been trying to get weight off with diet and activity and so far has been able to lose 4 of the 10 pounds  Previous GI Studies   **May not be a complete list of studies  Colonoscopy + EGD July 2021 for iron deficiency anemia - One 15 mm polyp in the transverse colon, removed using injection-lift and a cold snare. Resected and retrieved. - Four 2 to 4 mm polyps in the transverse colon, in the  ascending colon and in the cecum, removed with a cold snare. Resected and retrieved. - Internal hemorrhoids. - Normal terminal ileum - The examination was otherwise normal on direct and retroflexion views.  EGD  1. Erosive duodenitis 2. Otherwise unremarkable EGD status post biopsy to rule out Helicobacter pylori 3. Iron deficiency anemia.  Diagnosis 1. Surgical [P], colon, cecum, ascending, polyp (2) - TUBULAR ADENOMA(S). - NO HIGH GRADE DYSPLASIA OR CARCINOMA. 2. Surgical [P], colon, transverse, polyp (3) - TUBULAR ADENOMA(S). - NO HIGH GRADE DYSPLASIA OR CARCINOMA. 3. Surgical [P], gastric antrum - BENIGN ANTRAL MUCOSA. Cassandra Santana NEGATIVE FOR HELICOBACTER PYLORI. - NO INTESTINAL METAPLASIA, DYSPLASIA OR CARCINOMA.    Past Medical History:  Diagnosis Date   Allergies    Anemia    Arthritis    CAD (coronary artery disease)    Colitis    Colon polyp    High cholesterol    HTN (hypertension)    Hyperlipidemia    Hypertension    Internal hemorrhoids    NSTEMI (non-ST elevated myocardial infarction) (HCC) 04/2018   PAD (peripheral artery disease) (HCC)    Rectal polyp    hyperplastic   Past Surgical History:  Procedure Laterality Date   ABDOMINAL AORTOGRAM W/LOWER EXTREMITY Bilateral 08/28/2018   Procedure: ABDOMINAL AORTOGRAM W/LOWER EXTREMITY;  Surgeon: Runell Gess, MD;  Location: [facility] INVASIVE CV LAB;  Service: Cardiovascular;  Laterality: Bilateral;   ABDOMINAL HYSTERECTOMY  1993   CORONARY STENT INTERVENTION N/A 04/06/2018   Procedure: CORONARY STENT INTERVENTION;  Surgeon: Tonny Bollman, MD;  Location: [facility] INVASIVE CV LAB;  Service: Cardiovascular;  Laterality: N/A;   LEFT HEART CATH AND CORONARY ANGIOGRAPHY N/A 04/06/2018   Procedure: LEFT HEART CATH AND CORONARY ANGIOGRAPHY;  Surgeon: Tonny Bollman, MD;  Location: [place] INVASIVE CV LAB;  Service: Cardiovascular;  Laterality: N/A;   PERIPHERAL VASCULAR ATHERECTOMY Left 08/28/2018   Procedure: PERIPHERAL  VASCULAR ATHERECTOMY;  Surgeon: Runell Gess, MD;  Location: [facility] INVASIVE CV LAB;  Service: Cardiovascular;  Laterality: Left;  SFA   ROTATOR CUFF REPAIR Right    Family History  Problem Relation Age of Onset   Kidney failure Mother    Diabetes Mother    Heart disease Mother    Heart attack Father    Heart disease Father    Diabetes Sister    Heart disease Sister    Pancreatic cancer Maternal Grandmother    Colon cancer Neg Hx    Colon polyps Neg Hx    Esophageal cancer Neg Hx    Rectal cancer Neg Hx    Stomach cancer Neg Hx    Social History   Tobacco Use   Smoking status: Every Day    Current packs/day: 0.00    Types: Cigarettes    Last attempt to quit: 04/09/2018    Years since quitting: 5.0   Smokeless tobacco: Never  Vaping Use   Vaping status: Never Used  Substance Use Topics   Alcohol use: No   Drug use: Yes    Types: Marijuana    Comment: LAST USED 02/25/20 @1800  use every so often   Current Outpatient Medications  Medication Sig Dispense Refill   amitriptyline (ELAVIL) 50 MG tablet Take 50 mg by mouth at bedtime.     amLODipine (NORVASC) 5 MG tablet Take 1 tablet (5 mg total) by mouth daily. 90 tablet 3   aspirin EC 81 MG EC tablet Take 1 tablet (81 mg total) by mouth daily.     atorvastatin (LIPITOR) 80 MG tablet Take 1 tablet (80 mg total) by mouth daily at 6 PM. 90 tablet 3   clopidogrel (PLAVIX) 75 MG tablet Take 1 tablet (75 mg total) by mouth daily. 90 tablet 0   clotrimazole (LOTRIMIN) 1 % cream      ferrous sulfate 325 (65 FE) MG EC tablet Take 1 tablet (325 mg total) by mouth 2 (two) times daily. 60 tablet 3   gabapentin (NEURONTIN) 300 MG capsule Take 300 mg by mouth in the morning and at bedtime.     lisinopril (ZESTRIL) 5 MG tablet TAKE 1 TABLET BY MOUTH ONCE DAILY . APPOINTMENT REQUIRED FOR FUTURE REFILLS 90 tablet 3   Melatonin 3 MG CAPS Take 9 mg by mouth at bedtime.     pregabalin (LYRICA) 50 MG capsule Take 50 mg by mouth every morning.      nicotine (NICODERM CQ) 14 mg/24hr patch Place 1 patch (14 mg total) onto the skin daily. Use 1 patch daily for weeks 7-8 14 patch 0   nicotine (NICODERM CQ) 21 mg/24hr patch Place 1 patch (21 mg total) onto the skin daily. Use 1 patch daily for weeks 1-6. 7 patch 6   nicotine (NICODERM CQ) 7 mg/24hr patch Place 1 patch (7 mg total) onto the skin daily. Use 1 patch daily for weeks 9-10 14 patch 0   nitroGLYCERIN (NITROSTAT) 0.4 MG SL tablet Place  1 tablet (0.4 mg total) under the tongue every 5 (five) minutes x 3 doses as needed for chest pain. (Patient not taking: Reported on 05/11/2023) 25 tablet 6   No current facility-administered medications for this visit.   Allergies  Allergen Reactions   Bactrim [Sulfamethoxazole-Trimethoprim] Hives   Keflex [Cephalexin] Hives   Sulfa Antibiotics     "Blacked out" "almost Died"   Tomato Hives and Itching     Review of Systems: Positive for allergy / sinus problems, sleeping problems.  All other systems reviewed and negative except where noted in HPI.   Wt Readings from Last 3 Encounters:  05/11/23 144 lb 4 oz (65.4 kg)  10/01/22 136 lb 9.6 oz (62 kg)  09/13/21 131 lb 9.5 oz (59.7 kg)    Physical Exam:  BP 102/62   Pulse 84   Ht 5\' 3"  (1.6 m)   Wt 144 lb 4 oz (65.4 kg)   SpO2 97%   BMI 25.55 kg/m  Constitutional:  Pleasant, generally well appearing female in no acute distress. Psychiatric:  Normal mood and affect. Behavior is normal. EENT: Pupils normal.  Conjunctivae are normal. No scleral icterus. Neck supple.  Cardiovascular: Normal rate, regular rhythm.  Pulmonary/chest: Effort normal and breath sounds normal. Decreased breath sounds at both bases.  Abdominal: Soft, nondistended, mild mid upper tenderness. Fullness of mid upper abdomen, do definite edges. Bowel sounds active throughout. There are no masses palpable. No hepatomegaly. Neurological: Alert and oriented to person place and time.  Willette Cluster, NP  05/11/2023, 2:49  PM

## 2023-05-12 ENCOUNTER — Telehealth: Payer: Self-pay | Admitting: Cardiovascular Disease

## 2023-05-12 ENCOUNTER — Telehealth: Payer: Self-pay | Admitting: *Deleted

## 2023-05-12 DIAGNOSIS — I214 Non-ST elevation (NSTEMI) myocardial infarction: Secondary | ICD-10-CM

## 2023-05-12 DIAGNOSIS — I2510 Atherosclerotic heart disease of native coronary artery without angina pectoris: Secondary | ICD-10-CM

## 2023-05-12 MED ORDER — CLOPIDOGREL BISULFATE 75 MG PO TABS: 75.0000 mg | ORAL_TABLET | Freq: Every day | ORAL | 1 refills | Status: DC

## 2023-05-12 NOTE — Telephone Encounter (Signed)
Pt has been scheduled for tele pre op appt 07/05/23. Med rec and consent are done.

## 2023-05-12 NOTE — Telephone Encounter (Signed)
   Name: Cassandra Santana  DOB: [birth year]  MRN: [id number]  Primary Cardiologist: Nanetta Batty, MD   Preoperative team, please contact this patient and set up a phone call appointment on or after 06/10/2023 (procedure scheduled for 08/10/2023) for further preoperative risk assessment. Please obtain consent and complete medication review. Thank you for your help.  I confirm that guidance regarding antiplatelet and oral anticoagulation therapy has been completed and, if necessary, noted below.  Per office protocol, if patient is without any new symptoms or concerns at the time of their virtual visit, she may hold Plavix for 5 days prior to procedure. Please resume Plavix as soon as possible postprocedure, at the discretion of the surgeon.    I also confirmed the patient resides in the state of [place]. As per [employer] [facility] Medical Board telemedicine laws, the patient must reside in the state in which the provider is licensed.   Joylene Grapes, NP 05/12/2023, 11:33 AM [facility]

## 2023-05-12 NOTE — Telephone Encounter (Signed)
Pt has been scheduled for tele pre op appt 07/05/23. Med rec and consent are done.     Patient Consent for Virtual Visit        [name] has provided verbal consent on 05/12/2023 for a virtual visit (video or telephone).   CONSENT FOR VIRTUAL VISIT FOR:  Cassandra Santana  By participating in this virtual visit I agree to the following:  I hereby voluntarily request, consent and authorize [facility] and its employed or contracted physicians, physician assistants, nurse practitioners or other licensed health care professionals (the Practitioner), to provide me with telemedicine health care services (the "Services") as deemed necessary by the treating Practitioner. I acknowledge and consent to receive the Services by the Practitioner via telemedicine. I understand that the telemedicine visit will involve communicating with the Practitioner through live audiovisual communication technology and the disclosure of certain medical information by electronic transmission. I acknowledge that I have been given the opportunity to request an in-person assessment or other available alternative prior to the telemedicine visit and am voluntarily participating in the telemedicine visit.  I understand that I have the right to withhold or withdraw my consent to the use of telemedicine in the course of my care at any time, without affecting my right to future care or treatment, and that the Practitioner or I may terminate the telemedicine visit at any time. I understand that I have the right to inspect all information obtained and/or recorded in the course of the telemedicine visit and may receive copies of available information for a reasonable fee.  I understand that some of the potential risks of receiving the Services via telemedicine include:  Delay or interruption in medical evaluation due to technological equipment failure or disruption; Information transmitted may not be sufficient (e.g.  poor resolution of images) to allow for appropriate medical decision making by the Practitioner; and/or  In rare instances, security protocols could fail, causing a breach of personal health information.  Furthermore, I acknowledge that it is my responsibility to provide information about my medical history, conditions and care that is complete and accurate to the best of my ability. I acknowledge that Practitioner's advice, recommendations, and/or decision may be based on factors not within their control, such as incomplete or inaccurate data provided by me or distortions of diagnostic images or specimens that may result from electronic transmissions. I understand that the practice of medicine is not an exact science and that Practitioner makes no warranties or guarantees regarding treatment outcomes. I acknowledge that a copy of this consent can be made available to me via my patient portal [facility] MyChart), or I can request a printed copy by calling the office of [facility].    I understand that my insurance will be billed for this visit.   I have read or had this consent read to me. I understand the contents of this consent, which adequately explains the benefits and risks of the Services being provided via telemedicine.  I have been provided ample opportunity to ask questions regarding this consent and the Services and have had my questions answered to my satisfaction. I give my informed consent for the services to be provided through the use of telemedicine in my medical care

## 2023-05-12 NOTE — Progress Notes (Signed)
Cassandra Santana, With did you decide regarding Plavix.  Typically hold for 5 days and continue aspirin throughout, with cardiology approval. Thanks, JP

## 2023-05-12 NOTE — Telephone Encounter (Signed)
*  STAT* If patient is at the pharmacy, call can be transferred to refill team.   1. Which medications need to be refilled? (please list name of each medication and dose if known)   clopidogrel (PLAVIX) 75 MG tablet     2. Would you like to learn more about the convenience, safety, & potential cost savings by using the [facility]? No    3. Are you open to using the [facility] (Type [facility]. )No   4. Which pharmacy/location (including street and city if local pharmacy) is medication to be sent to?[facility] - [place] ([place]), [place] - [address]    5. Do they need a 30 day or 90 day supply? 90 day

## 2023-05-12 NOTE — Telephone Encounter (Signed)
Pt's medication was sent to pt's pharmacy as requested. Confirmation received.  °

## 2023-05-17 ENCOUNTER — Ambulatory Visit (HOSPITAL_COMMUNITY): Payer: Medicare HMO

## 2023-05-25 ENCOUNTER — Ambulatory Visit (HOSPITAL_COMMUNITY)
Admission: RE | Admit: 2023-05-25 | Discharge: 2023-05-25 | Disposition: A | Discharge status: Home / Self Care | Payer: Medicare HMO | Source: Ambulatory Visit | Attending: Nurse Practitioner | Admitting: Nurse Practitioner

## 2023-05-25 DIAGNOSIS — Z862 Personal history of diseases of the blood and blood-forming organs and certain disorders involving the immune mechanism: Secondary | ICD-10-CM | POA: Insufficient documentation

## 2023-05-25 DIAGNOSIS — Z7901 Long term (current) use of anticoagulants: Secondary | ICD-10-CM | POA: Diagnosis not present

## 2023-05-25 DIAGNOSIS — R198 Other specified symptoms and signs involving the digestive system and abdomen: Secondary | ICD-10-CM | POA: Insufficient documentation

## 2023-05-25 DIAGNOSIS — Z860100 Personal history of colon polyps, unspecified: Secondary | ICD-10-CM | POA: Insufficient documentation

## 2023-05-25 DIAGNOSIS — R109 Unspecified abdominal pain: Secondary | ICD-10-CM | POA: Diagnosis not present

## 2023-07-05 ENCOUNTER — Ambulatory Visit: Payer: Medicare HMO | Attending: Nurse Practitioner | Admitting: Emergency Medicine

## 2023-07-05 DIAGNOSIS — Z01810 Encounter for preprocedural cardiovascular examination: Secondary | ICD-10-CM | POA: Diagnosis not present

## 2023-07-05 NOTE — Telephone Encounter (Signed)
Clearance was given to patient

## 2023-07-05 NOTE — Progress Notes (Addendum)
Virtual Visit via Telephone Note   Because of Cassandra Santana's co-morbid illnesses, she is at least at moderate risk for complications without adequate follow up.  This format is felt to be most appropriate for this patient at this time.  The patient did not have access to video technology/had technical difficulties with video requiring transitioning to audio format only (telephone).  All issues noted in this document were discussed and addressed.  No physical exam could be performed with this format.  Please refer to the patient's chart for her consent to telehealth for [facility].  Evaluation Performed:  Preoperative cardiovascular risk assessment _____________   Date:  07/05/2023   Patient ID:  Cassandra Santana, Cassandra Santana [birth year], MRN [id number] Patient Location:  Home Provider location:   Office  Primary Care Provider:  Irena Reichmann, DO Primary Cardiologist:  Nanetta Batty, MD  Chief Complaint / Patient Profile   72 y.o. y/o female with a h/o coronary artery disease (NSTEMI), HTN, HLD, PAD, tobacco use, lower extremity claudication, and iron deficiency anemia who is pending colonoscopy on 08/10/2023 with [facility] and presents today for telephonic preoperative cardiovascular risk assessment.  History of Present Illness    Cassandra Santana is a 72 y.o. female who presents via audio/video conferencing for a telehealth visit today.  Pt was last seen in cardiology clinic on 10/01/2022 by Edd Fabian, NP.  At that time Cassandra Santana was doing well.  The patient is now pending procedure as outlined above. Since her last visit, she  denies chest pain, shortness of breath, lower extremity edema, fatigue, palpitations, melena, hematuria, hemoptysis, diaphoresis, weakness, presyncope, syncope, orthopnea, and PND.  Past Medical History    Past Medical History:  Diagnosis Date   Allergies    Anemia    Arthritis    CAD (coronary artery disease)    Colitis     Colon polyp    High cholesterol    HTN (hypertension)    Hyperlipidemia    Hypertension    Internal hemorrhoids    NSTEMI (non-ST elevated myocardial infarction) (HCC) 04/2018   PAD (peripheral artery disease) (HCC)    Rectal polyp    hyperplastic   Past Surgical History:  Procedure Laterality Date   ABDOMINAL AORTOGRAM W/LOWER EXTREMITY Bilateral 08/28/2018   Procedure: ABDOMINAL AORTOGRAM W/LOWER EXTREMITY;  Surgeon: Runell Gess, MD;  Location: [facility] INVASIVE CV LAB;  Service: Cardiovascular;  Laterality: Bilateral;   ABDOMINAL HYSTERECTOMY  1993   CORONARY STENT INTERVENTION N/A 04/06/2018   Procedure: CORONARY STENT INTERVENTION;  Surgeon: Tonny Bollman, MD;  Location: [facility] INVASIVE CV LAB;  Service: Cardiovascular;  Laterality: N/A;   LEFT HEART CATH AND CORONARY ANGIOGRAPHY N/A 04/06/2018   Procedure: LEFT HEART CATH AND CORONARY ANGIOGRAPHY;  Surgeon: Tonny Bollman, MD;  Location: [facility] INVASIVE CV LAB;  Service: Cardiovascular;  Laterality: N/A;   PERIPHERAL VASCULAR ATHERECTOMY Left 08/28/2018   Procedure: PERIPHERAL VASCULAR ATHERECTOMY;  Surgeon: Runell Gess, MD;  Location: [facility] INVASIVE CV LAB;  Service: Cardiovascular;  Laterality: Left;  SFA   ROTATOR CUFF REPAIR Right     Allergies  Allergies  Allergen Reactions   Bactrim [Sulfamethoxazole-Trimethoprim] Hives   Keflex [Cephalexin] Hives   Sulfa Antibiotics     "Blacked out" "almost Died"   Tomato Hives and Itching    Home Medications    Prior to Admission medications   Medication Sig Start Date End Date Taking? Authorizing Provider  amitriptyline (ELAVIL) 50 MG tablet Take 50 mg by  mouth at bedtime. 01/04/23   [provider]  amLODipine (NORVASC) 5 MG tablet Take 1 tablet (5 mg total) by mouth daily. 10/19/22   Runell Gess, MD  aspirin EC 81 MG EC tablet Take 1 tablet (81 mg total) by mouth daily. 04/08/18   Arty Baumgartner, NP  atorvastatin (LIPITOR) 80 MG tablet Take 1 tablet (80 mg total) by  mouth daily at 6 PM. 02/28/23   Runell Gess, MD  clopidogrel (PLAVIX) 75 MG tablet Take 1 tablet (75 mg total) by mouth daily. 05/12/23   Runell Gess, MD  clotrimazole (LOTRIMIN) 1 % cream  10/22/19   [provider]  ferrous sulfate 325 (65 FE) MG EC tablet Take 1 tablet (325 mg total) by mouth 2 (two) times daily. 02/27/20   Hilarie Fredrickson, MD  gabapentin (NEURONTIN) 300 MG capsule Take 300 mg by mouth in the morning and at bedtime.    [provider]  lisinopril (ZESTRIL) 5 MG tablet TAKE 1 TABLET BY MOUTH ONCE DAILY . APPOINTMENT REQUIRED FOR FUTURE REFILLS 11/29/22   Runell Gess, MD  Melatonin 3 MG CAPS Take 9 mg by mouth at bedtime.    [provider]  nicotine (NICODERM CQ) 14 mg/24hr patch Place 1 patch (14 mg total) onto the skin daily. Use 1 patch daily for weeks 7-8 10/01/22   Ronney Asters, NP  nicotine (NICODERM CQ) 21 mg/24hr patch Place 1 patch (21 mg total) onto the skin daily. Use 1 patch daily for weeks 1-6. 10/01/22   Ronney Asters, NP  nicotine (NICODERM CQ) 7 mg/24hr patch Place 1 patch (7 mg total) onto the skin daily. Use 1 patch daily for weeks 9-10 10/01/22   Ronney Asters, NP  nitroGLYCERIN (NITROSTAT) 0.4 MG SL tablet Place 1 tablet (0.4 mg total) under the tongue every 5 (five) minutes x 3 doses as needed for chest pain. 04/06/23   Runell Gess, MD  pregabalin (LYRICA) 50 MG capsule Take 50 mg by mouth every morning. 04/20/23   [provider]    Physical Exam    Vital Signs:  Cassandra Santana does not have vital signs available for review today.  Given telephonic nature of communication, physical exam is limited. AAOx3. NAD. Normal affect.  Speech and respirations are unlabored.  Accessory Clinical Findings    None  Assessment & Plan    1.  Preoperative Cardiovascular Risk Assessment: According to the Revised Cardiac Risk Index (RCRI), her Perioperative Risk of Major Cardiac Event is (%): 0.9. Her  Functional Capacity in METs is: 6.36 according to the [employer] Activity Status Index (DASI). Therefore, based on ACC/AHA guidelines, patient would be at acceptable risk for the planned procedure without further cardiovascular testing. I will route this recommendation to the requesting party via Epic fax function.  The patient was advised that if she develops new symptoms prior to surgery to contact our office to arrange for a follow-up visit, and she verbalized understanding.  Hold Plavix for 5 days prior to procedure. Please resume Plavix as soon as possible postprocedure, at the discretion of the surgeon.   A copy of this note will be routed to requesting surgeon.  Time:   Today, I have spent 7 minutes with the patient with telehealth technology discussing medical history, symptoms, and management plan.     Denyce Robert, NP  07/05/2023, 2:41 PM

## 2023-08-10 ENCOUNTER — Encounter: Payer: Self-pay | Admitting: Internal Medicine

## 2023-08-10 ENCOUNTER — Ambulatory Visit: Payer: Medicare HMO | Admitting: Internal Medicine

## 2023-08-10 VITALS — BP 142/77 | HR 98 | Temp 98.8°F | Resp 14 | Ht 63.0 in | Wt 144.0 lb

## 2023-08-10 DIAGNOSIS — Z860100 Personal history of colon polyps, unspecified: Secondary | ICD-10-CM | POA: Diagnosis not present

## 2023-08-10 DIAGNOSIS — Z1211 Encounter for screening for malignant neoplasm of colon: Secondary | ICD-10-CM | POA: Diagnosis not present

## 2023-08-10 DIAGNOSIS — K648 Other hemorrhoids: Secondary | ICD-10-CM

## 2023-08-10 DIAGNOSIS — I10 Essential (primary) hypertension: Secondary | ICD-10-CM | POA: Diagnosis not present

## 2023-08-10 DIAGNOSIS — Z860101 Personal history of adenomatous and serrated colon polyps: Secondary | ICD-10-CM | POA: Diagnosis not present

## 2023-08-10 DIAGNOSIS — D123 Benign neoplasm of transverse colon: Secondary | ICD-10-CM

## 2023-08-10 DIAGNOSIS — E785 Hyperlipidemia, unspecified: Secondary | ICD-10-CM | POA: Diagnosis not present

## 2023-08-10 DIAGNOSIS — R195 Other fecal abnormalities: Secondary | ICD-10-CM

## 2023-08-10 DIAGNOSIS — I2510 Atherosclerotic heart disease of native coronary artery without angina pectoris: Secondary | ICD-10-CM | POA: Diagnosis not present

## 2023-08-10 DIAGNOSIS — I252 Old myocardial infarction: Secondary | ICD-10-CM | POA: Diagnosis not present

## 2023-08-10 MED ORDER — SODIUM CHLORIDE 0.9 % IV SOLN: 500.0000 mL | Freq: Once | INTRAVENOUS | Status: AC

## 2023-08-10 NOTE — Progress Notes (Signed)
 Called to room to assist during endoscopic procedure.  Patient ID and intended procedure confirmed with present staff. Received instructions for my participation in the procedure from the performing physician.

## 2023-08-10 NOTE — Patient Instructions (Signed)
-   Resume previous diet. - Continue present medications. - Resume Plavix  today -Stop smoking   YOU HAD AN ENDOSCOPIC PROCEDURE TODAY AT THE [facility]:   Refer to the procedure report that was given to you for any specific questions about what was found during the examination.  If the procedure report does not answer your questions, please call your gastroenterologist to clarify.  If you requested that your care partner not be given the details of your procedure findings, then the procedure report has been included in a sealed envelope for you to review at your convenience later.  YOU SHOULD EXPECT: Some feelings of bloating in the abdomen. Passage of more gas than usual.  Walking can help get rid of the air that was put into your GI tract during the procedure and reduce the bloating. If you had a lower endoscopy (such as a colonoscopy or flexible sigmoidoscopy) you may notice spotting of blood in your stool or on the toilet paper. If you underwent a bowel prep for your procedure, you may not have a normal bowel movement for a few days.  Please Note:  You might notice some irritation and congestion in your nose or some drainage.  This is from the oxygen used during your procedure.  There is no need for concern and it should clear up in a day or so.  SYMPTOMS TO REPORT IMMEDIATELY:  Following lower endoscopy (colonoscopy or flexible sigmoidoscopy):  Excessive amounts of blood in the stool  Significant tenderness or worsening of abdominal pains  Swelling of the abdomen that is new, acute  Fever of 100F or higher  For urgent or emergent issues, a gastroenterologist can be reached at any hour by calling [phone]. Do not use MyChart messaging for urgent concerns.    DIET:  We do recommend a small meal at first, but then you may proceed to your regular diet.  Drink plenty of fluids but you should avoid alcoholic beverages for 24 hours.  ACTIVITY:  You should plan to take it  easy for the rest of today and you should NOT DRIVE or use heavy machinery until tomorrow (because of the sedation medicines used during the test).    FOLLOW UP: Our staff will call the number listed on your records the next business day following your procedure.  We will call around 7:15- 8:00 am to check on you and address any questions or concerns that you may have regarding the information given to you following your procedure. If we do not reach you, we will leave a message.     If any biopsies were taken you will be contacted by phone or by letter within the next 1-3 weeks.  Please call us  at [phone] if you have not heard about the biopsies in 3 weeks.    SIGNATURES/CONFIDENTIALITY: You and/or your care partner have signed paperwork which will be entered into your electronic medical record.  These signatures attest to the fact that that the information above on your After Visit Summary has been reviewed and is understood.  Full responsibility of the confidentiality of this discharge information lies with you and/or your care-partner.

## 2023-08-10 NOTE — Progress Notes (Signed)
 Approx 1155, pt spit out through her mouth and nose bile colored fluid.  Hob immediately dropped into steep t-burg, mouth opened and suctioning started.  O2 turned all the way up and pt allowed to wake up.  Sats did drop even with max O2.  Pt is a heavy smoker and continously coughing so BS hard to hear afterwards.  Sats 94/95 on room air in PACU Approx 200 cc in suction cannister not including that on pillow and in Co2 trap

## 2023-08-10 NOTE — Op Note (Signed)
 Cassandra Santana Patient Name: Cassandra Santana Procedure Date: 08/10/2023 11:30 AM MRN: [id number] Endoscopist: Norleen SAILOR. Abran , MD, [id number] Age: 73 Referring MD:  Date of Birth: June 25, 1951 Gender: Female Account #: [id number] Procedure:                Colonoscopy with cold snare polypectomy x 1 Indications:              High risk colon cancer surveillance: Personal                            history of adenoma (10 mm or greater in size), High                            risk colon cancer surveillance: Personal history of                            multiple (3 or more) adenomas. Previous                            examinations 2004, 2014 Medicines:                Monitored Anesthesia Care Procedure:                Pre-Anesthesia Assessment:                           - Prior to the procedure, a History and Physical                            was performed, and patient medications and                            allergies were reviewed. The patient's tolerance of                            previous anesthesia was also reviewed. The risks                            and benefits of the procedure and the sedation                            options and risks were discussed with the patient.                            All questions were answered, and informed consent                            was obtained. Prior Anticoagulants: The patient has                            taken Plavix  (clopidogrel ), last dose was 5 days                            prior to procedure. ASA Grade Assessment: III - A  patient with severe systemic disease. After                            reviewing the risks and benefits, the patient was                            deemed in satisfactory condition to undergo the                            procedure.                           After obtaining informed consent, the colonoscope                            was passed under direct vision.  Throughout the                            procedure, the patient's blood pressure, pulse, and                            oxygen saturations were monitored continuously. The                            Olympus Scope SN: [id number] was introduced through                            the anus and advanced to the the cecum, identified                            by appendiceal orifice and ileocecal valve. The                            ileocecal valve, appendiceal orifice, and rectum                            were photographed. The quality of the bowel                            preparation was upgraded to good after vigorous                            irrigation and suctioning. The colonoscopy was                            performed without difficulty. The patient tolerated                            the procedure well. The bowel preparation used was                            SUPREP via split dose instruction. Scope In: 11:41:56 AM Scope Out: 11:57:35 AM Scope Withdrawal Time: 0 hours 10 minutes 26 seconds  Total Procedure Duration: 0 hours 15 minutes  39 seconds  Findings:                 A 5 mm polyp was found in the transverse colon. The                            polyp was removed with a cold snare. Resection was                            complete. Polyp not retrieved as it was lost in                            debris.                           Internal hemorrhoids were found during retroflexion.                           The exam was otherwise without abnormality.                            Excellent view from anal os. No retroflexion as                            patient began to vomit and cough, thus unable to                            retain air. Complications:            No immediate complications. Estimated blood loss:                            None. Estimated Blood Loss:     Estimated blood loss: none. Impression:               - One 5 mm polyp in the transverse colon, removed                             with a cold snare. Resected and retrieved.                           - Internal hemorrhoids.                           - The examination was otherwise normal on direct                            and retroflexion views. Recommendation:           - Repeat colonoscopy in 5 years for surveillance.                           - Patient has a contact number available for                            emergencies. The signs and symptoms of potential  delayed complications were discussed with the                            patient. Return to normal activities tomorrow.                            Written discharge instructions were provided to the                            patient.                           - Resume previous diet.                           - Continue present medications.                           -Resume Plavix  today                           -Stop smoking Cassandra Santana N. Abran, MD 08/10/2023 12:14:32 PM This report has been signed electronically.

## 2023-08-10 NOTE — Progress Notes (Signed)
 Report to PACU RN.  Pt still coughing.  C/o "heartburn"  situation explained to pt. Still hard to hear BS with pt coughing.  Sats 94/95 on RA

## 2023-08-10 NOTE — Progress Notes (Signed)
 Pt's states no medical or surgical changes since previsit or office visit.

## 2023-08-10 NOTE — Progress Notes (Signed)
 Expand All Collapse All      ASSESSMENT & PLAN    73 y.o. yo female known to Dr Abran with a history of iron deficiency anemia, erosive duodenitis, recurrent colon polyps, PAD, CAD / NSTEMI on plavix , tobacco abuse   History of colon polyps ( 2021).  Multiple adenomatous colon polyps ranging from 2-15 mm ( 2021).  Due for surveillance colonoscopy.  --Schedule for a surveillance  colonoscopy. The risks and benefits of colonoscopy with possible polypectomy / biopsies were discussed and the patient agrees to proceed.    Blood in stool ( small volume).  Probably hemorrhoidal bleeding  --Further evaluation at time of colonoscopy    Abnormal abdominal exam.  Upper abdominal tenderness to palpation. Mid upper abdominal fullness. No associated N/V or weight loss.  --Lipase, CBC, CMET --Abdominal US    History of IDA, evaluated by us  in 2021.  No recent labs or Hematology follow up. Remains on daily iron CBC today     05/12/23 ADDENDUM TO ABOVE::  CAD/ NSTEMI on Plavix  Hold Plavix  for 5 days before colonoscopy - will instruct when and how to resume after procedure. Patient understands that there is a low but real risk of cardiovascular event such as heart attack, stroke, or embolism /  thrombosis, or ischemia while off Plavix . The patient consents to proceed. Will communicate by phone or EMR with patient's prescribing provider  Dr. Court to confirm that holding Plavix  is reasonable in this case.      HPI    Chief complaint :  history of colon polyps   Cassandra Santana has no complaints. She does endorse some red blood in stool over the last month . No rectal pain. She sometimes strains to have a BM. Takes Metamucil and / or a laxative as needed.  On plavix  and a daily baby asa. No other NSAIDS.    Black pepper gives her heartburn . TUMS help but most of the time she just puts up with it.    Still taking BID oral iron. Hasn't been back to the Hematologist for IV iron in a couple of years. She  hasn't had any labs done for anemia in a long time. Feels cold all the time.    Vern gained 10 pounds after starting lyrica. Has been trying to get weight off with diet and activity and so far has been able to lose 4 of the 10 pounds   Previous GI Studies    **May not be a complete list of studies   Colonoscopy + EGD July 2021 for iron deficiency anemia - One 15 mm polyp in the transverse colon, removed using injection-lift and a cold snare. Resected and retrieved. - Four 2 to 4 mm polyps in the transverse colon, in the ascending colon and in the cecum, removed with a cold snare. Resected and retrieved. - Internal hemorrhoids. - Normal terminal ileum - The examination was otherwise normal on direct and retroflexion views.   EGD  1. Erosive duodenitis 2. Otherwise unremarkable EGD status post biopsy to rule out Helicobacter pylori 3. Iron deficiency anemia.   Diagnosis 1. Surgical [P], colon, cecum, ascending, polyp (2) - TUBULAR ADENOMA(S). - NO HIGH GRADE DYSPLASIA OR CARCINOMA. 2. Surgical [P], colon, transverse, polyp (3) - TUBULAR ADENOMA(S). - NO HIGH GRADE DYSPLASIA OR CARCINOMA. 3. Surgical [P], gastric antrum - BENIGN ANTRAL MUCOSA. GLENWOOD PHLEGM NEGATIVE FOR HELICOBACTER PYLORI. - NO INTESTINAL METAPLASIA, DYSPLASIA OR CARCINOMA.           Past Medical History:  Diagnosis Date   Allergies     Anemia     Arthritis     CAD (coronary artery disease)     Colitis     Colon polyp     High cholesterol     HTN (hypertension)     Hyperlipidemia     Hypertension     Internal hemorrhoids     NSTEMI (non-ST elevated myocardial infarction) (HCC) 04/2018   PAD (peripheral artery disease) (HCC)     Rectal polyp      hyperplastic             Past Surgical History:  Procedure Laterality Date   ABDOMINAL AORTOGRAM W/LOWER EXTREMITY Bilateral 08/28/2018    Procedure: ABDOMINAL AORTOGRAM W/LOWER EXTREMITY;  Surgeon: Court Dorn PARAS, MD;  Location: [facility] INVASIVE CV LAB;   Service: Cardiovascular;  Laterality: Bilateral;   ABDOMINAL HYSTERECTOMY   1993   CORONARY STENT INTERVENTION N/A 04/06/2018    Procedure: CORONARY STENT INTERVENTION;  Surgeon: Wonda Sharper, MD;  Location: [facility] INVASIVE CV LAB;  Service: Cardiovascular;  Laterality: N/A;   LEFT HEART CATH AND CORONARY ANGIOGRAPHY N/A 04/06/2018    Procedure: LEFT HEART CATH AND CORONARY ANGIOGRAPHY;  Surgeon: Wonda Sharper, MD;  Location: [facility] INVASIVE CV LAB;  Service: Cardiovascular;  Laterality: N/A;   PERIPHERAL VASCULAR ATHERECTOMY Left 08/28/2018    Procedure: PERIPHERAL VASCULAR ATHERECTOMY;  Surgeon: Court Dorn PARAS, MD;  Location: [facility] INVASIVE CV LAB;  Service: Cardiovascular;  Laterality: Left;  SFA   ROTATOR CUFF REPAIR Right               Family History  Problem Relation Age of Onset   Kidney failure Mother     Diabetes Mother     Heart disease Mother     Heart attack Father     Heart disease Father     Diabetes Sister     Heart disease Sister     Pancreatic cancer Maternal Grandmother     Colon cancer Neg Hx     Colon polyps Neg Hx     Esophageal cancer Neg Hx     Rectal cancer Neg Hx     Stomach cancer Neg Hx          Social History  Social History         Tobacco Use   Smoking status: Every Day      Current packs/day: 0.00      Types: Cigarettes      Last attempt to quit: 04/09/2018      Years since quitting: 5.0   Smokeless tobacco: Never  Vaping Use   Vaping status: Never Used  Substance Use Topics   Alcohol use: No   Drug use: Yes      Types: Marijuana      Comment: LAST USED 02/25/20 @1800  use every so often            Current Outpatient Medications  Medication Sig Dispense Refill   amitriptyline (ELAVIL) 50 MG tablet Take 50 mg by mouth at bedtime.       amLODipine  (NORVASC ) 5 MG tablet Take 1 tablet (5 mg total) by mouth daily. 90 tablet 3   aspirin  EC 81 MG EC tablet Take 1 tablet (81 mg total) by mouth daily.       atorvastatin  (LIPITOR ) 80 MG tablet Take 1  tablet (80 mg total) by mouth daily at 6 PM. 90 tablet 3   clopidogrel  (PLAVIX ) 75 MG tablet Take 1 tablet (75  mg total) by mouth daily. 90 tablet 0   clotrimazole (LOTRIMIN) 1 % cream         ferrous sulfate  325 (65 FE) MG EC tablet Take 1 tablet (325 mg total) by mouth 2 (two) times daily. 60 tablet 3   gabapentin (NEURONTIN) 300 MG capsule Take 300 mg by mouth in the morning and at bedtime.       lisinopril  (ZESTRIL ) 5 MG tablet TAKE 1 TABLET BY MOUTH ONCE DAILY . APPOINTMENT REQUIRED FOR FUTURE REFILLS 90 tablet 3   Melatonin 3 MG CAPS Take 9 mg by mouth at bedtime.       pregabalin (LYRICA) 50 MG capsule Take 50 mg by mouth every morning.       nicotine  (NICODERM CQ ) 14 mg/24hr patch Place 1 patch (14 mg total) onto the skin daily. Use 1 patch daily for weeks 7-8 14 patch 0   nicotine  (NICODERM CQ ) 21 mg/24hr patch Place 1 patch (21 mg total) onto the skin daily. Use 1 patch daily for weeks 1-6. 7 patch 6   nicotine  (NICODERM CQ ) 7 mg/24hr patch Place 1 patch (7 mg total) onto the skin daily. Use 1 patch daily for weeks 9-10 14 patch 0   nitroGLYCERIN  (NITROSTAT ) 0.4 MG SL tablet Place 1 tablet (0.4 mg total) under the tongue every 5 (five) minutes x 3 doses as needed for chest pain. (Patient not taking: Reported on 05/11/2023) 25 tablet 6      No current facility-administered medications for this visit.      Allergies       Allergies  Allergen Reactions   Bactrim  [Sulfamethoxazole -Trimethoprim ] Hives   Keflex [Cephalexin] Hives   Sulfa  Antibiotics        Blacked out almost Died   Tomato Hives and Itching          Review of Systems: Positive for allergy / sinus problems, sleeping problems.  All other systems reviewed and negative except where noted in HPI.       Wt Readings from Last 3 Encounters:  05/11/23 144 lb 4 oz (65.4 kg)  10/01/22 136 lb 9.6 oz (62 kg)  09/13/21 131 lb 9.5 oz (59.7 kg)      Physical Exam:  BP 102/62   Pulse 84   Ht 5' 3 (1.6 m)   Wt 144 lb  4 oz (65.4 kg)   SpO2 97%   BMI 25.55 kg/m  Constitutional:  Pleasant, generally well appearing female in no acute distress. Psychiatric:  Normal mood and affect. Behavior is normal. EENT: Pupils normal.  Conjunctivae are normal. No scleral icterus. Neck supple.  Cardiovascular: Normal rate, regular rhythm.  Pulmonary/chest: Effort normal and breath sounds normal. Decreased breath sounds at both bases.  Abdominal: Soft, nondistended, mild mid upper tenderness. Fullness of mid upper abdomen, do definite edges. Bowel sounds active throughout. There are no masses palpable. No hepatomegaly. Neurological: Alert and oriented to person place and time.   Vina Dasen, NP  05/11/2023, 2:49 PM   Recent H&P as above.  Workup for abdominal discomfort was negative including ultrasound and laboratories.  Now for colonoscopy to evaluate Hemoccult positive stool in a patient with multiple adenomatous polyps

## 2023-08-11 ENCOUNTER — Telehealth: Payer: Self-pay

## 2023-08-11 NOTE — Telephone Encounter (Signed)
 No answer, left message to call if having any issues or concerns, B.Vale Mousseau RN

## 2023-09-14 DIAGNOSIS — I2510 Atherosclerotic heart disease of native coronary artery without angina pectoris: Secondary | ICD-10-CM | POA: Diagnosis not present

## 2023-09-14 DIAGNOSIS — G4700 Insomnia, unspecified: Secondary | ICD-10-CM | POA: Diagnosis not present

## 2023-09-14 DIAGNOSIS — Z77011 Contact with and (suspected) exposure to lead: Secondary | ICD-10-CM | POA: Diagnosis not present

## 2023-09-14 DIAGNOSIS — F17200 Nicotine dependence, unspecified, uncomplicated: Secondary | ICD-10-CM | POA: Diagnosis not present

## 2023-09-14 DIAGNOSIS — I252 Old myocardial infarction: Secondary | ICD-10-CM | POA: Diagnosis not present

## 2023-09-14 DIAGNOSIS — I739 Peripheral vascular disease, unspecified: Secondary | ICD-10-CM | POA: Diagnosis not present

## 2023-09-14 DIAGNOSIS — R42 Dizziness and giddiness: Secondary | ICD-10-CM | POA: Diagnosis not present

## 2023-09-14 DIAGNOSIS — D509 Iron deficiency anemia, unspecified: Secondary | ICD-10-CM | POA: Diagnosis not present

## 2023-10-24 ENCOUNTER — Telehealth: Payer: Self-pay | Admitting: Cardiovascular Disease

## 2023-10-24 MED ORDER — AMLODIPINE BESYLATE 5 MG PO TABS: 5.0000 mg | ORAL_TABLET | Freq: Every day | ORAL | 0 refills | Status: DC

## 2023-10-24 NOTE — Telephone Encounter (Signed)
*  STAT* If patient is at the pharmacy, call can be transferred to refill team.   1. Which medications need to be refilled? (please list name of each medication and dose if known) amLODipine (NORVASC) 5 MG tablet   2. Which pharmacy/location (including street and city if local pharmacy) is medication to be sent to? [facility] [address] [place]), [place] - [id number] Phone: [phone]  Fax: [phone]     3. Do they need a 30 day or 90 day supply? 90 Pt has enough til [employer]

## 2023-10-24 NOTE — Telephone Encounter (Signed)
 Pt's medication was sent to pt's pharmacy as requested. Confirmation received.

## 2023-11-09 ENCOUNTER — Encounter: Payer: Self-pay | Admitting: Cardiovascular Disease

## 2023-11-09 ENCOUNTER — Ambulatory Visit: Attending: Cardiovascular Disease | Admitting: Cardiovascular Disease

## 2023-11-09 VITALS — BP 112/60 | HR 69 | Ht 63.0 in | Wt 141.0 lb

## 2023-11-09 DIAGNOSIS — I10 Essential (primary) hypertension: Secondary | ICD-10-CM | POA: Diagnosis not present

## 2023-11-09 DIAGNOSIS — Z720 Tobacco use: Secondary | ICD-10-CM | POA: Diagnosis not present

## 2023-11-09 DIAGNOSIS — E782 Mixed hyperlipidemia: Secondary | ICD-10-CM | POA: Diagnosis not present

## 2023-11-09 DIAGNOSIS — I739 Peripheral vascular disease, unspecified: Secondary | ICD-10-CM | POA: Diagnosis not present

## 2023-11-09 MED ORDER — NICOTINE 7 MG/24HR TD PT24: 7.0000 mg | MEDICATED_PATCH | Freq: Every day | TRANSDERMAL | 0 refills | Status: AC

## 2023-11-09 MED ORDER — NICOTINE 21 MG/24HR TD PT24: 21.0000 mg | MEDICATED_PATCH | Freq: Every day | TRANSDERMAL | 6 refills | Status: AC

## 2023-11-09 MED ORDER — NICOTINE 14 MG/24HR TD PT24: 14.0000 mg | MEDICATED_PATCH | Freq: Every day | TRANSDERMAL | 0 refills | Status: AC

## 2023-11-09 NOTE — Progress Notes (Signed)
 11/09/2023 Cassandra Santana   12-09-1950  [id number]  Primary Physician Irena Reichmann, DO Primary Cardiologist: Runell Gess MD Milagros Loll, [place], [place]  HPI:  Cassandra Santana is a 73 y.o.   mildly overweight divorced African-American female with no children who works as a Lawyer.  I last  saw her in the office 05/20/2021.  She had a non-STEMI 04/05/2018.  She underwent radial diagnostic cath by Dr. Excell Seltzer the following day revealing a high-grade mid AV groove circumflex which underwent PCI and drug-eluting stenting using a synergy drug-eluting stent.  She had mild disease in the proximal LAD and moderate disease of the PDA.  EF was normal.  She is placed on dual antiplatelet therapy including aspirin and Brilinta as well as high-dose statin therapy.  The problems include treated hypertension although suboptimally and hyperlipidemia on high-dose statin therapy although she does complain of some myalgias related to this.   Because of complaints of bilateral lower extremity pain she had Doppler studies performed 08/04/2018 revealing a right ABI of 0.71 and a left ABI 0.64.  She had an occluded distal right SFA and an occluded left popliteal artery.  Her symptoms are lifestyle limiting.  She did stop smoking the day after her non-STEMI. She underwent peripheral angiography by myself 08/28/2018 documenting bilateral SFA CTO's with three-vessel runoff.  She underwent Hawk 1 directional atherectomy followed by drug-coated balloon angioplasty on the left SFA with an excellent angiographic result.  Her claudication has resolved and her Dopplers performed 09/15/2018 have markedly improved.   Since I saw her in the office 2-1/2 years ago she continues to do well.  She denies chest pain, shortness of breath or claudication.  She continues to smoke 10 cigarettes a day.   Current Meds  Medication Sig   amitriptyline (ELAVIL) 50 MG tablet Take 50 mg by mouth at bedtime.   amLODipine (NORVASC) 5 MG tablet  Take 1 tablet (5 mg total) by mouth daily.   aspirin EC 81 MG EC tablet Take 1 tablet (81 mg total) by mouth daily.   atorvastatin (LIPITOR) 80 MG tablet Take 1 tablet (80 mg total) by mouth daily at 6 PM.   clopidogrel (PLAVIX) 75 MG tablet Take 1 tablet (75 mg total) by mouth daily.   clotrimazole (LOTRIMIN) 1 % cream    ferrous sulfate 325 (65 FE) MG EC tablet Take 1 tablet (325 mg total) by mouth 2 (two) times daily.   gabapentin (NEURONTIN) 100 MG capsule Take 100 mg by mouth 2 (two) times daily.   gabapentin (NEURONTIN) 300 MG capsule Take 300 mg by mouth in the morning and at bedtime.   lisinopril (ZESTRIL) 5 MG tablet TAKE 1 TABLET BY MOUTH ONCE DAILY . APPOINTMENT REQUIRED FOR FUTURE REFILLS   Melatonin 3 MG CAPS Take 9 mg by mouth at bedtime.   nitroGLYCERIN (NITROSTAT) 0.4 MG SL tablet Place 1 tablet (0.4 mg total) under the tongue every 5 (five) minutes x 3 doses as needed for chest pain.   traZODone (DESYREL) 100 MG tablet Take 200 mg by mouth at bedtime as needed.   Current Facility-Administered Medications for the 11/09/23 encounter (Office Visit) with Runell Gess, MD  Medication   0.9 %  sodium chloride infusion     Allergies  Allergen Reactions   Sulfa Antibiotics     "Blacked out" "almost Died"   Bactrim [Sulfamethoxazole-Trimethoprim] Hives   Keflex [Cephalexin] Hives   Tomato Hives and Itching    Social History  Socioeconomic History   Marital status: Legally Separated    Spouse name: Not on file   Number of children: Not on file   Years of education: Not on file   Highest education level: Not on file  Occupational History   Occupation: CNA    Employer: [employer]  Tobacco Use   Smoking status: Every Day    Current packs/day: 0.00    Types: Cigarettes    Last attempt to quit: 04/09/2018    Years since quitting: 5.5   Smokeless tobacco: Never  Vaping Use   Vaping status: Never Used  Substance and Sexual Activity   Alcohol use: No   Drug use:  Yes    Types: Marijuana    Comment: LAST USED 02/25/20 @1800  use every so often   Sexual activity: Yes    Birth control/protection: Surgical  Other Topics Concern   Not on file  Social History Narrative   Not on file   Social Drivers of Health   Financial Resource Strain: Not on file  Food Insecurity: Not on file  Transportation Needs: No Transportation Needs (03/17/2022)   PRAPARE - Administrator, Civil Service (Medical): No    Lack of Transportation (Non-Medical): No  Physical Activity: Not on file  Stress: Not on file  Social Connections: Not on file  Intimate Partner Violence: Not on file     Review of Systems: General: negative for chills, fever, night sweats or weight changes.  Cardiovascular: negative for chest pain, dyspnea on exertion, edema, orthopnea, palpitations, paroxysmal nocturnal dyspnea or shortness of breath Dermatological: negative for rash Respiratory: negative for cough or wheezing Urologic: negative for hematuria Abdominal: negative for nausea, vomiting, diarrhea, bright red blood per rectum, melena, or hematemesis Neurologic: negative for visual changes, syncope, or dizziness All other systems reviewed and are otherwise negative except as noted above.    Blood pressure 112/60, pulse 69, height 5\' 3"  (1.6 m), weight 141 lb (64 kg), SpO2 99%.  General appearance: alert and no distress Neck: no adenopathy, no carotid bruit, no JVD, supple, symmetrical, trachea midline, and thyroid not enlarged, symmetric, no tenderness/mass/nodules Lungs: clear to auscultation bilaterally Heart: regular rate and rhythm, S1, S2 normal, no murmur, click, rub or gallop Extremities: extremities normal, atraumatic, no cyanosis or edema Pulses: Decreased pedal pulses Skin: Skin color, texture, turgor normal. No rashes or lesions Neurologic: Grossly normal  EKG EKG Interpretation Date/Time:  Wednesday November 09 2023 14:09:37 EDT Ventricular Rate:  62 PR  Interval:  186 QRS Duration:  84 QT Interval:  394 QTC Calculation: 399 R Axis:   39  Text Interpretation: Normal sinus rhythm Normal ECG When compared with ECG of 28-Aug-2018 08:31, No significant change was found Confirmed by Nanetta Batty [phone]) on 11/09/2023 2:14:27 PM    ASSESSMENT AND PLAN:   NSTEMI (non-ST elevated myocardial infarction) (HCC) History of non-STEMI/CAD status post cardiac catheterization performed by Dr. Excell Seltzer 9//19 with PCI and stenting of the AV groove circumflex.  She had mild disease in the proximal LAD and moderate disease in the PDA with normal LV function.  She denies chest pain or shortness of breath.  Hypertension History of essential hypertension her blood pressure measured today 112/60.  She is on amlodipine and lisinopril.  Hyperlipidemia History of hyperlipidemia on high-dose statin therapy with lipid profile performed 02/10/2022 revealing total cholesterol 159, LDL 61 and HDL 49.  Tobacco use Ongoing tobacco abuse of 10 cigarettes a day.  Will write her prescription for NicoDerm patch.  Peripheral arterial disease (HCC) History of PAD status post peripheral angiography performed by myself 08/28/2018 revealing occluded SFAs bilaterally with three-vessel runoff.  She underwent directional atherectomy followed by [facility] of a long segment left SFA CTO with excellent result.  Her follow-up Dopplers normalized and her claudication on the left resolved.  She had no symptoms on the right.  She continues to deny claudication.  We will recheck lower extremity arterial Doppler studies.     Runell Gess MD FACP,FACC,FAHA, [facility] 11/09/2023 2:23 PM

## 2023-11-09 NOTE — Assessment & Plan Note (Signed)
 History of PAD status post peripheral angiography performed by myself 08/28/2018 revealing occluded SFAs bilaterally with three-vessel runoff.  She underwent directional atherectomy followed by [facility] of a long segment left SFA CTO with excellent result.  Her follow-up Dopplers normalized and her claudication on the left resolved.  She had no symptoms on the right.  She continues to deny claudication.  We will recheck lower extremity arterial Doppler studies.

## 2023-11-09 NOTE — Assessment & Plan Note (Signed)
 Ongoing tobacco abuse of 10 cigarettes a day.  Will write her prescription for NicoDerm patch.

## 2023-11-09 NOTE — Assessment & Plan Note (Signed)
 History of non-STEMI/CAD status post cardiac catheterization performed by Dr. Excell Seltzer 9//19 with PCI and stenting of the AV groove circumflex.  She had mild disease in the proximal LAD and moderate disease in the PDA with normal LV function.  She denies chest pain or shortness of breath.

## 2023-11-09 NOTE — Patient Instructions (Signed)
 Medication Instructions:  START nicoderm patches as advised   21mg  patches x6 weeks 14mg  patches x2 weeks 7mg  patches x2 weeks  *If you need a refill on your cardiac medications before your next appointment, please call your pharmacy*  Lab Work: Lipid and Liver panel today   If you have labs (blood work) drawn today and your tests are completely normal, you will receive your results only by: MyChart Message (if you have MyChart) OR A paper copy in the mail If you have any lab test that is abnormal or we need to change your treatment, we will call you to review the results.  Testing/Procedures: Lower Extremity Arterial Doppler/ABI   Follow-Up: At [facility], you and your health needs are our priority.  As part of our continuing mission to provide you with exceptional heart care, our providers are all part of one team.  This team includes your primary Cardiologist (physician) and Advanced Practice Providers or APPs (Physician Assistants and Nurse Practitioners) who all work together to provide you with the care you need, when you need it.  Your next appointment:   12 months with Dr. Allyson Sabal     1st Floor: - Lobby - Registration  - Pharmacy  - Lab - Cafe  2nd Floor: - PV Lab - Diagnostic Testing (echo, CT, nuclear med)  3rd Floor: - Vacant  4th Floor: - TCTS (cardiothoracic surgery) - AFib Clinic - Structural Heart Clinic - Vascular Surgery  - Vascular Ultrasound  5th Floor: - HeartCare Cardiology (general and EP) - Clinical Pharmacy for coumadin, hypertension, lipid, weight-loss medications, and med management appointments    Valet parking services will be available as well.

## 2023-11-09 NOTE — Assessment & Plan Note (Signed)
 History of essential hypertension her blood pressure measured today 112/60.  She is on amlodipine and lisinopril.

## 2023-11-09 NOTE — Assessment & Plan Note (Signed)
 History of hyperlipidemia on high-dose statin therapy with lipid profile performed 02/10/2022 revealing total cholesterol 159, LDL 61 and HDL 49.

## 2023-11-10 LAB — HEPATIC FUNCTION PANEL
ALT: 14 IU/L (ref 0–32)
AST: 18 IU/L (ref 0–40)
Albumin: 4.3 g/dL (ref 3.8–4.8)
Alkaline Phosphatase: 97 IU/L (ref 44–121)
Bilirubin Total: 0.5 mg/dL (ref 0.0–1.2)
Bilirubin, Direct: 0.17 mg/dL (ref 0.00–0.40)
Total Protein: 6.5 g/dL (ref 6.0–8.5)

## 2023-11-10 LAB — LIPID PANEL
Chol/HDL Ratio: 2.7 ratio (ref 0.0–4.4)
Cholesterol, Total: 128 mg/dL (ref 100–199)
HDL: 48 mg/dL (ref >39)
LDL Chol Calc (NIH): 62 mg/dL (ref 0–99)
Triglycerides: 93 mg/dL (ref 0–149)
VLDL Cholesterol Cal: 18 mg/dL (ref 5–40)

## 2023-11-11 ENCOUNTER — Telehealth: Payer: Self-pay | Admitting: *Deleted

## 2023-11-11 NOTE — Telephone Encounter (Signed)
-----   Message from Nanetta Batty sent at 11/10/2023  5:49 AM EDT ----- Excellent FLP and LFTs

## 2023-11-11 NOTE — Telephone Encounter (Signed)
Called/left message for patient to call back for results.

## 2023-11-22 ENCOUNTER — Telehealth: Payer: Self-pay | Admitting: Cardiovascular Disease

## 2023-11-22 DIAGNOSIS — I214 Non-ST elevation (NSTEMI) myocardial infarction: Secondary | ICD-10-CM

## 2023-11-22 DIAGNOSIS — I2510 Atherosclerotic heart disease of native coronary artery without angina pectoris: Secondary | ICD-10-CM

## 2023-11-22 MED ORDER — LISINOPRIL 5 MG PO TABS
5.0000 mg | ORAL_TABLET | Freq: Every day | ORAL | 3 refills | Status: DC
Start: 2023-11-22 — End: 2024-02-01

## 2023-11-22 MED ORDER — CLOPIDOGREL BISULFATE 75 MG PO TABS: 75.0000 mg | ORAL_TABLET | Freq: Every day | ORAL | 3 refills | Status: AC

## 2023-11-22 NOTE — Telephone Encounter (Signed)
 Pt's medications were sent to pt's pharmacy as requested. Confirmation received.

## 2023-11-22 NOTE — Telephone Encounter (Signed)
*  STAT* If patient is at the pharmacy, call can be transferred to refill team.   1. Which medications need to be refilled? (please list name of each medication and dose if known)     lisinopril  (ZESTRIL ) 5 MG tablet    clopidogrel  (PLAVIX ) 75 MG tablet    4. Which pharmacy/location (including street and city if local pharmacy) is medication to be sent to?  [id number] - [place] ([place]), [place] - [address]     5. Do they need a 30 day or 90 day supply? 90    Pt states she is completely out

## 2023-11-27 ENCOUNTER — Ambulatory Visit (HOSPITAL_COMMUNITY): Admission: EM | Admit: 2023-11-27 | Discharge: 2023-11-27 | Disposition: A | Discharge status: Home / Self Care

## 2023-11-27 ENCOUNTER — Encounter (HOSPITAL_COMMUNITY): Payer: Self-pay | Admitting: Emergency Medicine

## 2023-11-27 ENCOUNTER — Other Ambulatory Visit: Payer: Self-pay

## 2023-11-27 ENCOUNTER — Emergency Department (HOSPITAL_COMMUNITY)
Admission: EM | Admit: 2023-11-27 | Discharge: 2023-11-28 | Disposition: A | Discharge status: Home / Self Care | Attending: Emergency Medicine | Admitting: Emergency Medicine

## 2023-11-27 DIAGNOSIS — R040 Epistaxis: Secondary | ICD-10-CM

## 2023-11-27 DIAGNOSIS — Z7901 Long term (current) use of anticoagulants: Secondary | ICD-10-CM | POA: Diagnosis not present

## 2023-11-27 DIAGNOSIS — I10 Essential (primary) hypertension: Secondary | ICD-10-CM | POA: Diagnosis not present

## 2023-11-27 DIAGNOSIS — I2510 Atherosclerotic heart disease of native coronary artery without angina pectoris: Secondary | ICD-10-CM | POA: Diagnosis not present

## 2023-11-27 DIAGNOSIS — M25512 Pain in left shoulder: Secondary | ICD-10-CM | POA: Diagnosis not present

## 2023-11-27 LAB — CBC
HCT: 39 % (ref 36.0–46.0)
Hemoglobin: 12.5 g/dL (ref 12.0–15.0)
MCH: 30 pg (ref 26.0–34.0)
MCHC: 32.1 g/dL (ref 30.0–36.0)
MCV: 93.5 fL (ref 80.0–100.0)
Platelets: 337 10*3/uL (ref 150–400)
RBC: 4.17 MIL/uL (ref 3.87–5.11)
RDW: 16 % — ABNORMAL HIGH (ref 11.5–15.5)
WBC: 6.1 10*3/uL (ref 4.0–10.5)
nRBC: 0 % (ref 0.0–0.2)

## 2023-11-27 LAB — PROTIME-INR
INR: 1 (ref 0.8–1.2)
Prothrombin Time: 13.1 s (ref 11.4–15.2)

## 2023-11-27 LAB — TROPONIN I (HIGH SENSITIVITY): Troponin I (High Sensitivity): 5 ng/L (ref <18)

## 2023-11-27 MED ORDER — HYDROCODONE-ACETAMINOPHEN 5-325 MG PO TABS
2.0000 | ORAL_TABLET | Freq: Once | ORAL | Status: AC
  Administered 2023-11-27: 2 via ORAL
  Filled 2023-11-27: qty 2

## 2023-11-27 MED ORDER — ONDANSETRON 4 MG PO TBDP
4.0000 mg | ORAL_TABLET | Freq: Once | ORAL | Status: AC
  Administered 2023-11-27: 4 mg via ORAL
  Filled 2023-11-27: qty 1

## 2023-11-27 MED ORDER — OXYMETAZOLINE HCL 0.05 % NA SOLN
1.0000 | Freq: Once | NASAL | Status: AC
Start: 2023-11-27 — End: 2023-11-27
  Administered 2023-11-27: 1 via NASAL
  Filled 2023-11-27: qty 30

## 2023-11-27 MED ORDER — TRANEXAMIC ACID 1000 MG/10ML IV SOLN
500.0000 mg | Freq: Once | INTRAVENOUS | Status: AC
  Administered 2023-11-27: 500 mg via TOPICAL
  Filled 2023-11-27: qty 10

## 2023-11-27 NOTE — ED Provider Notes (Signed)
 [facility]    CSN: [id number] Arrival date & time: 11/27/23  1424      History   Chief Complaint Chief Complaint  Patient presents with   Epistaxis    HPI Cassandra Santana is a 73 y.o. female.   73 year old female who presents to urgent care with complaints of a nosebleed.  This started about an hour prior to arrival.  She denies any injury to the area.  She is on Plavix  and aspirin .  She reports in the last 3 to 4 weeks she has had several nosebleeds.  She will was mowing grass 2 days ago and was wearing an N95 mask.  She has not had any significant nosebleeds prior to the last 3 weeks.  She has been holding pressure on the area.  The nosebleed has actually stopped now.   Epistaxis Associated symptoms: no cough, no fever and no sore throat     Past Medical History:  Diagnosis Date   Allergies    Anemia    Arthritis    CAD (coronary artery disease)    Colitis    Colon polyp    High cholesterol    HTN (hypertension)    Hyperlipidemia    Hypertension    Internal hemorrhoids    NSTEMI (non-ST elevated myocardial infarction) (HCC) 04/2018   PAD (peripheral artery disease) (HCC)    Rectal polyp    hyperplastic    Patient Active Problem List   Diagnosis Date Noted   Iron deficiency anemia due to chronic blood loss 03/05/2020   Claudication in peripheral vascular disease (HCC) 08/28/2018   Peripheral arterial disease (HCC) 08/08/2018   Hypertension 04/07/2018   Hyperlipidemia 04/07/2018   Tobacco use 04/07/2018   NSTEMI (non-ST elevated myocardial infarction) (HCC) 04/05/2018    Past Surgical History:  Procedure Laterality Date   ABDOMINAL AORTOGRAM W/LOWER EXTREMITY Bilateral 08/28/2018   Procedure: ABDOMINAL AORTOGRAM W/LOWER EXTREMITY;  Surgeon: Avanell Leigh, MD;  Location: [facility];  Service: Cardiovascular;  Laterality: Bilateral;   ABDOMINAL HYSTERECTOMY  1993   CORONARY STENT INTERVENTION N/A 04/06/2018   Procedure: CORONARY  STENT INTERVENTION;  Surgeon: Arnoldo Lapping, MD;  Location: [facility] INVASIVE CV LAB;  Service: Cardiovascular;  Laterality: N/A;   LEFT HEART CATH AND CORONARY ANGIOGRAPHY N/A 04/06/2018   Procedure: LEFT HEART CATH AND CORONARY ANGIOGRAPHY;  Surgeon: Arnoldo Lapping, MD;  Location: [facility] INVASIVE CV LAB;  Service: Cardiovascular;  Laterality: N/A;   PERIPHERAL VASCULAR ATHERECTOMY Left 08/28/2018   Procedure: PERIPHERAL VASCULAR ATHERECTOMY;  Surgeon: Avanell Leigh, MD;  Location: [facility] INVASIVE CV LAB;  Service: Cardiovascular;  Laterality: Left;  SFA   ROTATOR CUFF REPAIR Right     OB History   No obstetric history on file.      Home Medications    Prior to Admission medications   Medication Sig Start Date End Date Taking? Authorizing Provider  amitriptyline (ELAVIL) 50 MG tablet Take 50 mg by mouth at bedtime. 01/04/23   [provider]  amLODipine  (NORVASC ) 5 MG tablet Take 1 tablet (5 mg total) by mouth daily. 10/24/23   Avanell Leigh, MD  aspirin  EC 81 MG EC tablet Take 1 tablet (81 mg total) by mouth daily. 04/08/18   Sanjuanita Cruz, NP  atorvastatin  (LIPITOR ) 80 MG tablet Take 1 tablet (80 mg total) by mouth daily at 6 PM. 02/28/23   Avanell Leigh, MD  clopidogrel  (PLAVIX ) 75 MG tablet Take 1 tablet (75 mg total) by mouth  daily. 11/22/23   Avanell Leigh, MD  clotrimazole (LOTRIMIN) 1 % cream  10/22/19   [provider]  ferrous sulfate  325 (65 FE) MG EC tablet Take 1 tablet (325 mg total) by mouth 2 (two) times daily. 02/27/20   Tobin Forts, MD  gabapentin (NEURONTIN) 100 MG capsule Take 100 mg by mouth 2 (two) times daily.    [provider]  gabapentin (NEURONTIN) 300 MG capsule Take 300 mg by mouth in the morning and at bedtime.    [provider]  lisinopril  (ZESTRIL ) 5 MG tablet Take 1 tablet (5 mg total) by mouth daily. 11/22/23   Avanell Leigh, MD  Melatonin 3 MG CAPS Take 9 mg by mouth at bedtime.    [provider]   nicotine  (NICODERM CQ ) 14 mg/24hr patch Place 1 patch (14 mg total) onto the skin daily. Use 1 patch daily for weeks 7-8 11/09/23   Avanell Leigh, MD  nicotine  (NICODERM CQ ) 21 mg/24hr patch Place 1 patch (21 mg total) onto the skin daily. Use 1 patch daily for weeks 1-6. 11/09/23   Avanell Leigh, MD  nicotine  (NICODERM CQ ) 7 mg/24hr patch Place 1 patch (7 mg total) onto the skin daily. Use 1 patch daily for weeks 9-10 11/09/23   Avanell Leigh, MD  nitroGLYCERIN  (NITROSTAT ) 0.4 MG SL tablet Place 1 tablet (0.4 mg total) under the tongue every 5 (five) minutes x 3 doses as needed for chest pain. 04/06/23   Avanell Leigh, MD  pregabalin (LYRICA) 50 MG capsule Take 50 mg by mouth every morning. Patient not taking: Reported on 11/09/2023 04/20/23   [provider]  traZODone  (DESYREL ) 100 MG tablet Take 200 mg by mouth at bedtime as needed.    [provider]    Family History Family History  Problem Relation Age of Onset   Kidney failure Mother    Diabetes Mother    Heart disease Mother    Heart attack Father    Heart disease Father    Diabetes Sister    Heart disease Sister    Pancreatic cancer Maternal Grandmother    Colon cancer Neg Hx    Colon polyps Neg Hx    Esophageal cancer Neg Hx    Rectal cancer Neg Hx    Stomach cancer Neg Hx     Social History Social History   Tobacco Use   Smoking status: Every Day    Current packs/day: 0.00    Types: Cigarettes    Last attempt to quit: 04/09/2018    Years since quitting: 5.6   Smokeless tobacco: Never  Vaping Use   Vaping status: Never Used  Substance Use Topics   Alcohol use: No   Drug use: Yes    Types: Marijuana    Comment: LAST USED 02/25/20 @1800  use every so often     Allergies   Sulfa  antibiotics, Bactrim  [sulfamethoxazole -trimethoprim ], Keflex [cephalexin], and Tomato   Review of Systems Review of Systems  Constitutional:  Negative for chills and fever.  HENT:  Positive for nosebleeds.  Negative for ear pain and sore throat.   Eyes:  Negative for pain and visual disturbance.  Respiratory:  Negative for cough and shortness of breath.   Cardiovascular:  Negative for chest pain and palpitations.  Gastrointestinal:  Negative for abdominal pain and vomiting.  Genitourinary:  Negative for dysuria and hematuria.  Musculoskeletal:  Negative for arthralgias and back pain.  Skin:  Negative for color change and  rash.  Neurological:  Negative for seizures and syncope.  All other systems reviewed and are negative.    Physical Exam Triage Vital Signs ED Triage Vitals  Encounter Vitals Group     BP 11/27/23 1432 (!) 165/81     Systolic BP Percentile --      Diastolic BP Percentile --      Pulse Rate 11/27/23 1432 76     Resp 11/27/23 1432 18     Temp 11/27/23 1432 98.2 F (36.8 C)     Temp Source 11/27/23 1432 Oral     SpO2 11/27/23 1432 98 %     Weight --      Height --      Head Circumference --      Peak Flow --      Pain Score 11/27/23 1433 5     Pain Loc --      Pain Education --      Exclude from Growth Chart --    No data found.  Updated Vital Signs BP (!) 165/81 (BP Location: Right Arm)   Pulse 76   Temp 98.2 F (36.8 C) (Oral)   Resp 18   SpO2 98%   Visual Acuity Right Eye Distance:   Left Eye Distance:   Bilateral Distance:    Right Eye Near:   Left Eye Near:    Bilateral Near:     Physical Exam Vitals and nursing note reviewed.  Constitutional:      General: She is not in acute distress.    Appearance: She is well-developed.  HENT:     Head: Normocephalic and atraumatic.     Nose: No nasal deformity, signs of injury, nasal tenderness or congestion.     Right Nostril: No septal hematoma or occlusion.     Left Nostril: Epistaxis (blood present but no active bleeding) present. No septal hematoma or occlusion.  Eyes:     Conjunctiva/sclera: Conjunctivae normal.  Cardiovascular:     Rate and Rhythm: Normal rate.  Pulmonary:     Effort:  Pulmonary effort is normal. No respiratory distress.  Abdominal:     Palpations: Abdomen is soft.     Tenderness: There is no abdominal tenderness.  Musculoskeletal:        General: No swelling.     Cervical back: Neck supple.  Skin:    General: Skin is warm and dry.     Capillary Refill: Capillary refill takes less than 2 seconds.  Neurological:     Mental Status: She is alert.  Psychiatric:        Mood and Affect: Mood normal.      UC Treatments / Results  Labs (all labs ordered are listed, but only abnormal results are displayed) Labs Reviewed - No data to display  EKG   Radiology No results found.  Procedures Procedures (including critical care time)  Medications Ordered in UC Medications - No data to display  Initial Impression / Assessment and Plan / UC Course  I have reviewed the triage vital signs and the nursing notes.  Pertinent labs & imaging results that were available during my care of the patient were reviewed by me and considered in my medical decision making (see chart for details).     Epistaxis   Nosebleed has already resolved now.  Evidence of blood in the posterior aspect of the nose is present but no active bleeding seen.  This may have been secondary to cutting grass several days ago and likely  the mucosa of the nose is staying to dry allowing for cracking.  Recommend the following: Can try afrin nose spray 1 spray in each nostril if bleeding recurs. Then hold pressure with head tilted slightly forward for about 5-10 minutes.  2.   For daily moisturizing to help prevent nose bleeds, recommend using saline nose spray or gel. This can be purchased over the counter at [employer].  3.   If nose bleeds continue even after these, come back to urgent care but also would recommended contacting the providers who prescribed your blood thinners.   Final Clinical Impressions(s) / UC Diagnoses   Final diagnoses:  Epistaxis     Discharge  Instructions      Nosebleed has already resolved now.  Evidence of blood in the posterior aspect of the nose is present but no active bleeding seen.  This may have been secondary to cutting grass several days ago and likely the mucosa of the nose is staying to dry allowing for cracking.  Recommend the following: Can try afrin nose spray 1 spray in each nostril if bleeding recurs. Then hold pressure with head tilted slightly forward for about 5-10 minutes.  2.   For daily moisturizing to help prevent nose bleeds, recommend using saline nose spray or gel. This can be purchased over the counter at [employer].  3.   If nose bleeds continue even after these, come back to urgent care but also would recommended contacting the providers who prescribed your blood thinners.    ED Prescriptions   None    PDMP not reviewed this encounter.   Kreg Pesa, [place] 11/27/23 1542

## 2023-11-27 NOTE — ED Notes (Signed)
ENT cart placed at bedside.  

## 2023-11-27 NOTE — ED Triage Notes (Signed)
 Pt reports starting having a nose bleed continuously for one hour pta to UC. States she is on Plavix  and Aspirin  daily

## 2023-11-27 NOTE — ED Notes (Signed)
 Pt complaining of left shoulder pain. Pt has a heart Hx.

## 2023-11-27 NOTE — ED Provider Notes (Signed)
 [place] EMERGENCY DEPARTMENT AT [facility] Provider Note   CSN: [id number] Arrival date & time: 11/27/23  2047     History  Chief Complaint  Patient presents with   Epistaxis    Airah Bosma is a 73 y.o. female.  73 year old female presents today for concern of epistaxis within the left nare that started around 2 PM when she went to urgent care and had this controlled however she states it reoccurred around 7:30 PM and she could not get this controlled so she presented to the emergency room.  She is not on any blood thinning medication outside of Plavix .  No prior history of nosebleeds.  The history is provided by the patient. No language interpreter was used.       Home Medications Prior to Admission medications   Medication Sig Start Date End Date Taking? Authorizing Provider  amitriptyline (ELAVIL) 50 MG tablet Take 50 mg by mouth at bedtime. 01/04/23   [provider]  amLODipine  (NORVASC ) 5 MG tablet Take 1 tablet (5 mg total) by mouth daily. 10/24/23   Avanell Leigh, MD  aspirin  EC 81 MG EC tablet Take 1 tablet (81 mg total) by mouth daily. 04/08/18   Sanjuanita Cruz, NP  atorvastatin  (LIPITOR ) 80 MG tablet Take 1 tablet (80 mg total) by mouth daily at 6 PM. 02/28/23   Avanell Leigh, MD  clopidogrel  (PLAVIX ) 75 MG tablet Take 1 tablet (75 mg total) by mouth daily. 11/22/23   Avanell Leigh, MD  clotrimazole (LOTRIMIN) 1 % cream  10/22/19   [provider]  ferrous sulfate  325 (65 FE) MG EC tablet Take 1 tablet (325 mg total) by mouth 2 (two) times daily. 02/27/20   Tobin Forts, MD  gabapentin (NEURONTIN) 100 MG capsule Take 100 mg by mouth 2 (two) times daily.    [provider]  gabapentin (NEURONTIN) 300 MG capsule Take 300 mg by mouth in the morning and at bedtime.    [provider]  lisinopril  (ZESTRIL ) 5 MG tablet Take 1 tablet (5 mg total) by mouth daily. 11/22/23   Avanell Leigh, MD  Melatonin 3 MG  CAPS Take 9 mg by mouth at bedtime.    [provider]  nicotine  (NICODERM CQ ) 14 mg/24hr patch Place 1 patch (14 mg total) onto the skin daily. Use 1 patch daily for weeks 7-8 11/09/23   Avanell Leigh, MD  nicotine  (NICODERM CQ ) 21 mg/24hr patch Place 1 patch (21 mg total) onto the skin daily. Use 1 patch daily for weeks 1-6. 11/09/23   Avanell Leigh, MD  nicotine  (NICODERM CQ ) 7 mg/24hr patch Place 1 patch (7 mg total) onto the skin daily. Use 1 patch daily for weeks 9-10 11/09/23   Avanell Leigh, MD  nitroGLYCERIN  (NITROSTAT ) 0.4 MG SL tablet Place 1 tablet (0.4 mg total) under the tongue every 5 (five) minutes x 3 doses as needed for chest pain. 04/06/23   Avanell Leigh, MD  pregabalin (LYRICA) 50 MG capsule Take 50 mg by mouth every morning. Patient not taking: Reported on 11/09/2023 04/20/23   [provider]  traZODone  (DESYREL ) 100 MG tablet Take 200 mg by mouth at bedtime as needed.    [provider]      Allergies    Sulfa  antibiotics, Bactrim  [sulfamethoxazole -trimethoprim ], Keflex [cephalexin], and Tomato    Review of Systems   Review of Systems  Constitutional:  Negative for chills and fever.  HENT:  Positive for nosebleeds.   Respiratory:  Negative for shortness of breath.   Cardiovascular:  Negative for chest pain.  All other systems reviewed and are negative.   Physical Exam Updated Vital Signs BP (!) 160/71 (BP Location: Right Arm)   Pulse 72   Temp 98.2 F (36.8 C)   Resp 18   Ht 5\' 3"  (1.6 m)   Wt 63 kg   SpO2 96%   BMI 24.62 kg/m  Physical Exam Vitals and nursing note reviewed.  Constitutional:      General: She is not in acute distress.    Appearance: Normal appearance. She is not ill-appearing.  HENT:     Head: Normocephalic and atraumatic.     Nose: Nose normal.     Comments: Significant pulling of blood noted in the left nare.  Appears to be actively pooling.  Not improved with pressure. Eyes:     Conjunctiva/sclera:  Conjunctivae normal.  Cardiovascular:     Rate and Rhythm: Normal rate and regular rhythm.  Pulmonary:     Effort: Pulmonary effort is normal. No respiratory distress.  Musculoskeletal:        General: No deformity. Normal range of motion.     Cervical back: Normal range of motion.  Skin:    Findings: No rash.  Neurological:     Mental Status: She is alert.     ED Results / Procedures / Treatments   Labs (all labs ordered are listed, but only abnormal results are displayed) Labs Reviewed  CBC - Abnormal; Notable for the following components:      Result Value   RDW 16.0 (*)    All other components within normal limits  PROTIME-INR  TROPONIN I (HIGH SENSITIVITY)    EKG None  Radiology No results found.  Procedures Procedures    Medications Ordered in ED Medications  oxymetazoline  (AFRIN) 0.05 % nasal spray 1 spray (1 spray Each Nare Given 11/27/23 2111)  HYDROcodone -acetaminophen  (NORCO/VICODIN) 5-325 MG per tablet 2 tablet (2 tablets Oral Given 11/27/23 2228)  ondansetron  (ZOFRAN -ODT) disintegrating tablet 4 mg (4 mg Oral Given 11/27/23 2230)    ED Course/ Medical Decision Making/ A&P                                 Medical Decision Making Amount and/or Complexity of Data Reviewed Labs: ordered.  Risk OTC drugs. Prescription drug management.   Medical Decision Making / ED Course   This patient presents to the ED for concern of epistaxis, this involves an extensive number of treatment options, and is a complaint that carries with it a high risk of complications and morbidity.  The differential diagnosis includes anterior nosebleed, posterior nosebleed  MDM: 73 year old female with past medical history significant for CAD presents today for concern of nosebleed.  This is her second episode today.  Pressure applied in the emergency department for 15-20 minutes without improvement.  Rapid Rhino placed.  Patient did have some discomfort dental  placement. Following this placement she did endorse left shoulder discomfort.  She does have history of CAD.  Will obtain troponin and EKG. EKG without acute ischemic change.  Low suspicion for ACS.  Persistent bleeding down through the right nare. Patient evaluated at bedside with my attending.  Will place TXA gauze. Signed out to oncoming provider.   Additional history obtained: -Additional history obtained from husband at bedside -External records from outside source obtained and reviewed including:  Chart review including previous notes, labs, imaging, consultation notes   Lab Tests: -I ordered, reviewed, and interpreted labs.   The pertinent results include:   Labs Reviewed  CBC - Abnormal; Notable for the following components:      Result Value   RDW 16.0 (*)    All other components within normal limits  PROTIME-INR  TROPONIN I (HIGH SENSITIVITY)      EKG  EKG Interpretation Date/Time:    Ventricular Rate:    PR Interval:    QRS Duration:    QT Interval:    QTC Calculation:   R Axis:      Text Interpretation:          Medicines ordered and prescription drug management: Meds ordered this encounter  Medications   oxymetazoline  (AFRIN) 0.05 % nasal spray 1 spray   HYDROcodone -acetaminophen  (NORCO/VICODIN) 5-325 MG per tablet 2 tablet    Refill:  0   ondansetron  (ZOFRAN -ODT) disintegrating tablet 4 mg    -I have reviewed the patients home medicines and have made adjustments as needed  Social Determinants of Health:  Factors impacting patients care include: Good family support   Reevaluation: After the interventions noted above, I reevaluated the patient and found that they have :improved  Co morbidities that complicate the patient evaluation  Past Medical History:  Diagnosis Date   Allergies    Anemia    Arthritis    CAD (coronary artery disease)    Colitis    Colon polyp    High cholesterol    HTN (hypertension)    Hyperlipidemia     Hypertension    Internal hemorrhoids    NSTEMI (non-ST elevated myocardial infarction) (HCC) 04/2018   PAD (peripheral artery disease) (HCC)    Rectal polyp    hyperplastic      Dispostion: TXA gauze in place.  Signed out to oncoming provider to follow-up to see if this helps control the bleed.  Pending reevaluation.   Final Clinical Impression(s) / ED Diagnoses Final diagnoses:  Epistaxis    Rx / DC Orders ED Discharge Orders     None         Lucina Sabal, PA-C 11/27/23 2342    Afton Horse T, DO 12/06/23 0710

## 2023-11-27 NOTE — ED Triage Notes (Signed)
 Patient coming to ED for evaluation of nose bleed.  Reports symptoms started this morning.  Was seen at Urgent Care and given Afrin and Saline Nasal spray.  Reports nose bleed had stopped and began bleeding again around 7:30 PM.  No hx of nose bleeds.  States she has been taking Plavix  75 mg since 2019.  No recent injuries or head trauma

## 2023-11-27 NOTE — Discharge Instructions (Addendum)
 Nosebleed has already resolved now.  Evidence of blood in the posterior aspect of the nose is present but no active bleeding seen.  This may have been secondary to cutting grass several days ago and likely the mucosa of the nose is staying to dry allowing for cracking.  Recommend the following: Can try afrin nose spray 1 spray in each nostril if bleeding recurs. Then hold pressure with head tilted slightly forward for about 5-10 minutes.  2.   For daily moisturizing to help prevent nose bleeds, recommend using saline nose spray or gel. This can be purchased over the counter at [employer].  3.   If nose bleeds continue even after these, come back to urgent care but also would recommended contacting the providers who prescribed your blood thinners.

## 2023-11-28 LAB — TROPONIN I (HIGH SENSITIVITY): Troponin I (High Sensitivity): 6 ng/L (ref <18)

## 2023-11-28 MED ORDER — HYDROCODONE-ACETAMINOPHEN 5-325 MG PO TABS
1.0000 | ORAL_TABLET | Freq: Once | ORAL | Status: AC
  Administered 2023-11-28: 1 via ORAL
  Filled 2023-11-28: qty 1

## 2023-11-28 NOTE — Discharge Instructions (Signed)
 You were seen in the ER today for your nose bleed. Please follow up in the outpatient setting this week with the ENT listed below for removal of your nasal packing. Return to the ER with any new and persistent bleeding, or any other new severe symptoms.

## 2023-11-28 NOTE — ED Provider Notes (Signed)
  Physical Exam  BP (!) 161/84   Pulse 62   Temp 98.2 F (36.8 C)   Resp 13   Ht 5\' 3"  (1.6 m)   Wt 63 kg   SpO2 100%   BMI 24.62 kg/m   Physical Exam  Procedures  Procedures  ED Course / MDM    Medical Decision Making Amount and/or Complexity of Data Reviewed Labs: ordered.  Risk OTC drugs. Prescription drug management.   Care of this patient assumed from preceding ED provider Ceclia Cohens, PA-C at time of shift change.  Please see his associated note for further insight of the patient's records.  In brief patient is a 73 year old female presents with concern for bleeding from the left nostril started around 2 PM when she was at urgent care. This resolved, but recurred around 7:30 PM.  Patient has dual antiplatelet therapy with aspirin  and Plavix .  Patient had a Rhino Rocket placed in the left naris by preceding ED provider but was continued to have some oozing out of the right nostril per preceding PA.  At time of shift change patient has recently had right nostril packed with transischemic acid soaked gauze.  Left nostril remains with Rhino Rocket in place.  Pending observation for complete resolution of epistaxis.  Patient reevaluated intermittently throughout her stay in the emergency department.  Continue to have some oozing posteriorly from the left nostril that was dripping on the throat but no active hemorrhage out of the left nostril.  Patient observed for time with gradual lessening.  Ultimately resolution of posterior nasal drainage with epistaxis spontaneously.  Rhino Rocket was left in place and patient was stable for discharge home.  ENT follow-up recommended.  Clinical concern for emergent underlying condition that would warrant further ED workup or inpatient management is exceedingly low.  Keilee and her husband voiced understanding of her medical evaluation and treatment plan. Each of their questions answered to their expressed satisfaction.  Return precautions were given.   Patient is well-appearing, stable, and was discharged in good condition.  This chart was dictated using voice recognition software, Dragon. Despite the best efforts of this provider to proofread and correct errors, errors may still occur which can change documentation meaning.     Kae Oram, PA-C 11/28/23 0417    Eldon Greenland, MD 11/28/23 [phone]

## 2023-11-29 ENCOUNTER — Encounter (HOSPITAL_BASED_OUTPATIENT_CLINIC_OR_DEPARTMENT_OTHER): Payer: Self-pay | Admitting: Emergency Medicine

## 2023-11-29 ENCOUNTER — Emergency Department (HOSPITAL_BASED_OUTPATIENT_CLINIC_OR_DEPARTMENT_OTHER)
Admission: EM | Admit: 2023-11-29 | Discharge: 2023-11-29 | Disposition: A | Discharge status: Home / Self Care | Attending: Emergency Medicine | Admitting: Emergency Medicine

## 2023-11-29 ENCOUNTER — Other Ambulatory Visit: Payer: Self-pay

## 2023-11-29 ENCOUNTER — Emergency Department (HOSPITAL_BASED_OUTPATIENT_CLINIC_OR_DEPARTMENT_OTHER): Admitting: Radiology

## 2023-11-29 DIAGNOSIS — B9789 Other viral agents as the cause of diseases classified elsewhere: Secondary | ICD-10-CM | POA: Diagnosis not present

## 2023-11-29 DIAGNOSIS — I2510 Atherosclerotic heart disease of native coronary artery without angina pectoris: Secondary | ICD-10-CM | POA: Insufficient documentation

## 2023-11-29 DIAGNOSIS — I10 Essential (primary) hypertension: Secondary | ICD-10-CM | POA: Diagnosis not present

## 2023-11-29 DIAGNOSIS — Z79899 Other long term (current) drug therapy: Secondary | ICD-10-CM | POA: Insufficient documentation

## 2023-11-29 DIAGNOSIS — Z7982 Long term (current) use of aspirin: Secondary | ICD-10-CM | POA: Diagnosis not present

## 2023-11-29 DIAGNOSIS — J069 Acute upper respiratory infection, unspecified: Secondary | ICD-10-CM | POA: Insufficient documentation

## 2023-11-29 DIAGNOSIS — R059 Cough, unspecified: Secondary | ICD-10-CM | POA: Diagnosis not present

## 2023-11-29 LAB — RESP PANEL BY RT-PCR (RSV, FLU A&B, COVID)  RVPGX2
Influenza A by PCR: NEGATIVE
Influenza B by PCR: NEGATIVE
Resp Syncytial Virus by PCR: NEGATIVE
SARS Coronavirus 2 by RT PCR: NEGATIVE

## 2023-11-29 MED ORDER — AMOXICILLIN-POT CLAVULANATE 875-125 MG PO TABS: 1.0000 | ORAL_TABLET | Freq: Two times a day (BID) | ORAL | 0 refills | Status: AC

## 2023-11-29 MED ORDER — ONDANSETRON 4 MG PO TBDP: 4.0000 mg | ORAL_TABLET | Freq: Three times a day (TID) | ORAL | 0 refills | Status: AC | PRN

## 2023-11-29 MED ORDER — ACETAMINOPHEN 500 MG PO TABS
1000.0000 mg | ORAL_TABLET | Freq: Once | ORAL | Status: AC
  Administered 2023-11-29: 1000 mg via ORAL
  Filled 2023-11-29: qty 2

## 2023-11-29 MED ORDER — AMOXICILLIN-POT CLAVULANATE 875-125 MG PO TABS
1.0000 | ORAL_TABLET | Freq: Once | ORAL | Status: AC
  Administered 2023-11-29: 1 via ORAL
  Filled 2023-11-29: qty 1

## 2023-11-29 MED ORDER — OXYCODONE HCL 5 MG PO TABS: 5.0000 mg | ORAL_TABLET | ORAL | 0 refills | Status: AC | PRN

## 2023-11-29 MED ORDER — ONDANSETRON 4 MG PO TBDP
4.0000 mg | ORAL_TABLET | Freq: Once | ORAL | Status: AC
  Administered 2023-11-29: 4 mg via ORAL
  Filled 2023-11-29: qty 1

## 2023-11-29 NOTE — ED Triage Notes (Signed)
 Chills and body aches since Sunday Headache some coughing Was seen at [facility] Sunday for nose bleed (rhino rocket still in place) Last took acetaminophen  night

## 2023-11-29 NOTE — ED Notes (Signed)
 Urine sent to lab

## 2023-11-29 NOTE — ED Provider Notes (Signed)
 [place] EMERGENCY DEPARTMENT AT [facility] Provider Note   CSN: [id number] Arrival date & time: 11/29/23  1502     History  Chief Complaint  Patient presents with   Chills    Cassandra Santana is a 73 y.o. female with PMHx allergies, anemia, OA, CAD, HLD, HTN, who presents to ED concerned for chills, body aches, frontal headache, and mild coughing since Sunday. Patient also with Epistaxis on Sunday that was difficult to control so she went to ED and  eventually had a Rhinorocket placed. Patient with follow up ENT appointment in 2 days. Patient not currently on ABX for the nasal packing.  Patient has not taken any medications for her symptoms today. Patient also endorsing poor PO intake since Saturday. 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   Denies chest pain, SOB, nausea, vomiting, diarrhea. Denies head trauma, LOC, seizure. Denies dysuria, hematuria, hematochezia.   HPI     Home Medications Prior to Admission medications   Medication Sig Start Date End Date Taking? Authorizing Provider  amoxicillin -clavulanate (AUGMENTIN ) 875-125 MG tablet Take 1 tablet by mouth every 12 (twelve) hours for 7 days. 11/29/23 12/06/23 Yes Champion Corales, Twila Gale F, PA-C  ondansetron  (ZOFRAN -ODT) 4 MG disintegrating tablet Take 1 tablet (4 mg total) by mouth every 8 (eight) hours as needed for nausea. 11/29/23  Yes Dorisann Garre F, PA-C  oxyCODONE  (ROXICODONE ) 5 MG immediate release tablet Take 1 tablet (5 mg total) by mouth every 4 (four) hours as needed for up to 5 doses for severe pain (pain score 7-10). 11/29/23  Yes Dorisann Garre F, PA-C  amitriptyline (ELAVIL) 50 MG tablet Take 50 mg by mouth at bedtime. 01/04/23   [provider]  amLODipine  (NORVASC ) 5 MG tablet Take 1 tablet (5 mg total) by mouth daily. 10/24/23   Avanell Leigh, MD  aspirin  EC 81 MG EC tablet Take 1 tablet (81 mg total) by mouth daily. 04/08/18    Sanjuanita Cruz, NP  atorvastatin  (LIPITOR ) 80 MG tablet Take 1 tablet (80 mg total) by mouth daily at 6 PM. 02/28/23   Avanell Leigh, MD  clopidogrel  (PLAVIX ) 75 MG tablet Take 1 tablet (75 mg total) by mouth daily. 11/22/23   Avanell Leigh, MD  clotrimazole (LOTRIMIN) 1 % cream  10/22/19   [provider]  ferrous sulfate  325 (65 FE) MG EC tablet Take 1 tablet (325 mg total) by mouth 2 (two) times daily. 02/27/20   Tobin Forts, MD  gabapentin (NEURONTIN) 100 MG capsule Take 100 mg by mouth 2 (two) times daily.    [provider]  gabapentin (NEURONTIN) 300 MG capsule Take 300 mg by mouth in the morning and at bedtime.    [provider]  lisinopril  (ZESTRIL ) 5 MG tablet Take 1 tablet (5 mg total) by mouth daily. 11/22/23   Avanell Leigh, MD  Melatonin 3 MG CAPS Take 9 mg by mouth at bedtime.    [provider]  nicotine  (NICODERM CQ ) 14 mg/24hr patch Place 1 patch (14 mg total) onto the skin daily. Use 1 patch daily for weeks 7-8 11/09/23   Avanell Leigh, MD  nicotine  (NICODERM CQ ) 21 mg/24hr patch Place 1 patch (21 mg total) onto the skin daily. Use 1 patch daily for weeks 1-6. 11/09/23   Avanell Leigh, MD  nicotine  (NICODERM CQ ) 7 mg/24hr patch Place 1 patch (7 mg total) onto the skin  daily. Use 1 patch daily for weeks 9-10 11/09/23   Avanell Leigh, MD  nitroGLYCERIN  (NITROSTAT ) 0.4 MG SL tablet Place 1 tablet (0.4 mg total) under the tongue every 5 (five) minutes x 3 doses as needed for chest pain. 04/06/23   Avanell Leigh, MD  pregabalin (LYRICA) 50 MG capsule Take 50 mg by mouth every morning. Patient not taking: Reported on 11/09/2023 04/20/23   [provider]  traZODone  (DESYREL ) 100 MG tablet Take 200 mg by mouth at bedtime as needed.    [provider]      Allergies    Sulfa  antibiotics, Bactrim  [sulfamethoxazole -trimethoprim ], Keflex [cephalexin], and Tomato    Review of Systems   Review of Systems   Neurological:  Positive for headaches.    Physical Exam Updated Vital Signs BP (!) 156/107   Pulse 62   Temp 98.9 F (37.2 C)   Resp 18   SpO2 100%  Physical Exam Vitals and nursing note reviewed.  Constitutional:      General: She is not in acute distress.    Appearance: She is not ill-appearing or toxic-appearing.  HENT:     Head: Normocephalic and atraumatic.     Nose:     Comments: Rhinorocket in place in left nostril    Mouth/Throat:     Mouth: Mucous membranes are moist.     Pharynx: No oropharyngeal exudate or posterior oropharyngeal erythema.  Eyes:     General: No scleral icterus.       Right eye: No discharge.        Left eye: No discharge.     Conjunctiva/sclera: Conjunctivae normal.  Cardiovascular:     Rate and Rhythm: Normal rate and regular rhythm.     Pulses: Normal pulses.     Heart sounds: Normal heart sounds. No murmur heard. Pulmonary:     Effort: Pulmonary effort is normal. No respiratory distress.     Breath sounds: Normal breath sounds. No wheezing, rhonchi or rales.  Abdominal:     Tenderness: There is no abdominal tenderness.  Musculoskeletal:     Right lower leg: No edema.     Left lower leg: No edema.  Skin:    General: Skin is warm and dry.     Findings: No rash.  Neurological:     General: No focal deficit present.     Mental Status: She is alert and oriented to person, place, and time. Mental status is at baseline.     Comments: GCS 15. Speech is goal oriented. No deficits appreciated to CN III-XII; symmetric eyebrow raise, no facial drooping, tongue midline. Patient has equal grip strength bilaterally with 5/5 strength against resistance in all major muscle groups bilaterally. Sensation to light touch intact. Patient moves extremities without ataxia.  Psychiatric:        Mood and Affect: Mood normal.     ED Results / Procedures / Treatments   Labs (all labs ordered are listed, but only abnormal results are displayed) Labs  Reviewed  RESP PANEL BY RT-PCR (RSV, FLU A&B, COVID)  [id number]    EKG None  Radiology DG Chest 2 View Result Date: 11/29/2023 CLINICAL DATA:  cough EXAM: CHEST - 2 VIEW COMPARISON:  04/05/2018 FINDINGS: Lungs are clear. Heart size and mediastinal contours are within normal limits. Aortic Atherosclerosis (ICD10-170.0). No effusion. Left shoulder DJD. IMPRESSION: No acute cardiopulmonary disease. Electronically Signed   By: Nicoletta Barrier M.D.   On: 11/29/2023 16:48    Procedures Procedures  Medications Ordered in ED Medications  acetaminophen  (TYLENOL ) tablet 1,000 mg (1,000 mg Oral Given 11/29/23 1721)  ondansetron  (ZOFRAN -ODT) disintegrating tablet 4 mg (4 mg Oral Given 11/29/23 1721)  amoxicillin -clavulanate (AUGMENTIN ) 875-125 MG per tablet 1 tablet (1 tablet Oral Given 11/29/23 1721)    ED Course/ Medical Decision Making/ A&P                                 Medical Decision Making Amount and/or Complexity of Data Reviewed Radiology: ordered.  Risk OTC drugs. Prescription drug management.    This patient presents to the ED for concern of headache, chills, nausea, cough, body aches, this involves an extensive number of treatment options, and is a complaint that carries with it a high risk of complications and morbidity.  The differential diagnosis includes Flu/COVID/RSV, strep pharyngitis, sinusitis, peritonsillar abscess, retropharyngeal abscess, pneumonia, meningitis.   Co morbidities that complicate the patient evaluation   allergies, anemia, OA, CAD, HLD, HTN,   Additional history obtained:  Dr. Hazeline Lister PCP Nasal packing placed 2 days ago in ED   Problem List / ED Course / Critical interventions / Medication management  Patient presents to ED concern for headache, chills, body aches, cough x 2 days.  Of note, patient also with difficult to control epistaxis requiring Rhino Rocket placement on "Sunday.  Patient has a follow-up appointment with ENT in 2 days.   Physical exam reassuring.  Patient afebrile with stable vitals. I Ordered, and personally interpreted labs.  Respiratory panel negative. I ordered imaging studies including chest xray to assess for process contributing to patient's symptoms. I independently visualized and interpreted imaging which showed no acute process. I agree with the radiologist interpretation Shared all results with patient.  Answered all questions.  It appears patient is suffering from a viral URI. Provided patient with Zofran and Tylenol.  Patient stated that she is feels a lot better and would like something to eat.  Patient tolerated PO well.  I will also start patient on Augmentin for her nasal packing.  Patient understands that she will need to follow-up with her ENT provider in the next 2 days.  Provided patient with a dose of Augmentin in ED which she tolerated well.  Patient was educated on alternating between 650 mg Tylenol and 400 mg ibuprofen every 3 hours as needed.  Staffed with Dr. Tegeler who agrees with plan I have reviewed the patients home medicines and have made adjustments as needed The patient has been appropriately medically screened and/or stabilized in the ED. I have low suspicion for any other emergent medical condition which would require further screening, evaluation or treatment in the ED or require inpatient management. At time of discharge the patient is hemodynamically stable and in no acute distress. I have discussed work-up results and diagnosis with patient and answered all questions. Patient is agreeable with discharge plan. We discussed strict return precautions for returning to the emergency department and they verbalized understanding.      Social Determinants of Health:  geriatric         Final Clinical Impression(s) / ED Diagnoses Final diagnoses:  Viral URI    Rx / DC Orders ED Discharge Orders          Ordered    oxyCODONE (ROXICODONE) 5 MG immediate release tablet   Every 4 hours PRN        04" /29/25 1738    amoxicillin -clavulanate (AUGMENTIN ) 875-125 MG  tablet  Every 12 hours        11/29/23 1813    ondansetron  (ZOFRAN -ODT) 4 MG disintegrating tablet  Every 8 hours PRN        11/29/23 1814              [place], PA-C 12/01/23 4259    Tegeler, Marine Sia, MD 12/03/23 1258

## 2023-11-29 NOTE — Discharge Instructions (Addendum)
 It was a pleasure caring for you today.  Please follow-up with the ENT appointment next 2 days.  Seek emergency care if experiencing any new or worsening symptoms.  Alternating between 650 mg Tylenol  and 400 mg Advil: The best way to alternate taking Acetaminophen  (example Tylenol ) and Ibuprofen (example Advil/Motrin) is to take them 3 hours apart. For example, if you take ibuprofen at 6 am you can then take Tylenol  at 9 am. You can continue this regimen throughout the day, making sure you do not exceed the recommended maximum dose for each drug.

## 2023-12-01 ENCOUNTER — Encounter (INDEPENDENT_AMBULATORY_CARE_PROVIDER_SITE_OTHER): Payer: Self-pay

## 2023-12-01 ENCOUNTER — Ambulatory Visit (INDEPENDENT_AMBULATORY_CARE_PROVIDER_SITE_OTHER): Admitting: Physician Assistant

## 2023-12-01 VITALS — BP 167/74 | HR 65 | Ht 63.0 in

## 2023-12-01 DIAGNOSIS — R040 Epistaxis: Secondary | ICD-10-CM | POA: Diagnosis not present

## 2023-12-01 NOTE — Progress Notes (Unsigned)
 Dear Dr. Hazeline Lister, Here is my assessment for our mutual patient, Cassandra Santana. Thank you for allowing me the opportunity to care for your patient. Please do not hesitate to contact me should you have any other questions. Sincerely, Belma Boxer PA-C  Otolaryngology Clinic Note Referring provider: Dr. Hazeline Lister HPI:  Cassandra Santana is a 73 y.o. female kindly referred by Dr. Hazeline Lister   The patient is a 73 year old female presenting today for hospital follow-up status post epistaxis.  The patient notes that on 11/27/2023 she had epistaxis out of the left nose.  She notes this did not stop with pressure, she saw evaluation in the ER for this.  She does note that she has had a previous history of the same and has been having intermittent nosebleeds for several months.  She notes mostly its on the left, sometimes on the right.  In the ER balloon packing was placed and she was discharged home on antibiotics and outpatient follow-up.  She denies any bleeding since that time.  She also notes that on the day of evaluation she had been having fever and chills.  She notes that since she was seen in the hospital she continued to have fever and chills and followed up in the emergency room yesterday with essentially normal workup.  She denies any preceding trauma to her nose, she does not put anything in her nose.  She is not using nasal sprays.  She notes she is on aspirin  and Plavix  and has been holding it since the most recent nosebleed.  She is a current smoker.    Independent Review of Additional Tests or Records:  ER visit note on 11/27/2023, ER visit note on 11/29/2023   PMH/Meds/All/SocHx/FamHx/ROS:   Past Medical History:  Diagnosis Date   Allergies    Anemia    Arthritis    CAD (coronary artery disease)    Colitis    Colon polyp    High cholesterol    HTN (hypertension)    Hyperlipidemia    Hypertension    Internal hemorrhoids    NSTEMI (non-ST elevated myocardial infarction) (HCC) 04/2018    PAD (peripheral artery disease) (HCC)    Rectal polyp    hyperplastic     Past Surgical History:  Procedure Laterality Date   ABDOMINAL AORTOGRAM W/LOWER EXTREMITY Bilateral 08/28/2018   Procedure: ABDOMINAL AORTOGRAM W/LOWER EXTREMITY;  Surgeon: Avanell Leigh, MD;  Location: [facility] INVASIVE CV LAB;  Service: Cardiovascular;  Laterality: Bilateral;   ABDOMINAL HYSTERECTOMY  1993   CORONARY STENT INTERVENTION N/A 04/06/2018   Procedure: CORONARY STENT INTERVENTION;  Surgeon: Arnoldo Lapping, MD;  Location: [facility] INVASIVE CV LAB;  Service: Cardiovascular;  Laterality: N/A;   LEFT HEART CATH AND CORONARY ANGIOGRAPHY N/A 04/06/2018   Procedure: LEFT HEART CATH AND CORONARY ANGIOGRAPHY;  Surgeon: Arnoldo Lapping, MD;  Location: [facility] INVASIVE CV LAB;  Service: Cardiovascular;  Laterality: N/A;   PERIPHERAL VASCULAR ATHERECTOMY Left 08/28/2018   Procedure: PERIPHERAL VASCULAR ATHERECTOMY;  Surgeon: Avanell Leigh, MD;  Location: [facility] INVASIVE CV LAB;  Service: Cardiovascular;  Laterality: Left;  SFA   ROTATOR CUFF REPAIR Right     Family History  Problem Relation Age of Onset   Kidney failure Mother    Diabetes Mother    Heart disease Mother    Heart attack Father    Heart disease Father    Diabetes Sister    Heart disease Sister    Pancreatic cancer Maternal Grandmother    Colon cancer Neg Hx  Colon polyps Neg Hx    Esophageal cancer Neg Hx    Rectal cancer Neg Hx    Stomach cancer Neg Hx      Social Connections: Not on file      Current Outpatient Medications:    amitriptyline (ELAVIL) 50 MG tablet, Take 50 mg by mouth at bedtime., Disp: , Rfl:    amLODipine  (NORVASC ) 5 MG tablet, Take 1 tablet (5 mg total) by mouth daily., Disp: 90 tablet, Rfl: 0   amoxicillin -clavulanate (AUGMENTIN ) 875-125 MG tablet, Take 1 tablet by mouth every 12 (twelve) hours for 7 days., Disp: 14 tablet, Rfl: 0   atorvastatin  (LIPITOR ) 80 MG tablet, Take 1 tablet (80 mg total) by mouth daily at 6 PM., Disp: 90  tablet, Rfl: 3   clotrimazole (LOTRIMIN) 1 % cream, , Disp: , Rfl:    ferrous sulfate  325 (65 FE) MG EC tablet, Take 1 tablet (325 mg total) by mouth 2 (two) times daily., Disp: 60 tablet, Rfl: 3   gabapentin (NEURONTIN) 100 MG capsule, Take 100 mg by mouth 2 (two) times daily., Disp: , Rfl:    gabapentin (NEURONTIN) 300 MG capsule, Take 300 mg by mouth in the morning and at bedtime., Disp: , Rfl:    lisinopril  (ZESTRIL ) 5 MG tablet, Take 1 tablet (5 mg total) by mouth daily., Disp: 90 tablet, Rfl: 3   Melatonin 3 MG CAPS, Take 9 mg by mouth at bedtime., Disp: , Rfl:    nicotine  (NICODERM CQ ) 14 mg/24hr patch, Place 1 patch (14 mg total) onto the skin daily. Use 1 patch daily for weeks 7-8, Disp: 14 patch, Rfl: 0   nicotine  (NICODERM CQ ) 21 mg/24hr patch, Place 1 patch (21 mg total) onto the skin daily. Use 1 patch daily for weeks 1-6., Disp: 7 patch, Rfl: 6   nicotine  (NICODERM CQ ) 7 mg/24hr patch, Place 1 patch (7 mg total) onto the skin daily. Use 1 patch daily for weeks 9-10, Disp: 14 patch, Rfl: 0   nitroGLYCERIN  (NITROSTAT ) 0.4 MG SL tablet, Place 1 tablet (0.4 mg total) under the tongue every 5 (five) minutes x 3 doses as needed for chest pain., Disp: 25 tablet, Rfl: 6   ondansetron  (ZOFRAN -ODT) 4 MG disintegrating tablet, Take 1 tablet (4 mg total) by mouth every 8 (eight) hours as needed for nausea., Disp: 10 tablet, Rfl: 0   oxyCODONE  (ROXICODONE ) 5 MG immediate release tablet, Take 1 tablet (5 mg total) by mouth every 4 (four) hours as needed for up to 5 doses for severe pain (pain score 7-10)., Disp: 5 tablet, Rfl: 0   pregabalin (LYRICA) 50 MG capsule, Take 50 mg by mouth every morning., Disp: , Rfl:    traZODone  (DESYREL ) 100 MG tablet, Take 200 mg by mouth at bedtime as needed., Disp: , Rfl:    aspirin  EC 81 MG EC tablet, Take 1 tablet (81 mg total) by mouth daily. (Patient not taking: Reported on 12/01/2023), Disp: , Rfl:    clopidogrel  (PLAVIX ) 75 MG tablet, Take 1 tablet (75 mg total)  by mouth daily. (Patient not taking: Reported on 12/01/2023), Disp: 90 tablet, Rfl: 3  Current Facility-Administered Medications:    0.9 %  sodium chloride  infusion, 500 mL, Intravenous, Once, Tobin Forts, MD   Physical Exam:   BP (!) 167/74 (BP Location: Left Arm, Cuff Size: Normal)   Pulse 65   Ht 5\' 3"  (1.6 m)   SpO2 95%   BMI 24.62 kg/m   Pertinent Findings  CN II-XII  intact Left-sided Rhino Rocket in place, dried blood around the nare, right side with no bleeding, no lesions, once the [employer] was removed it did reveal a continued area of bleeding along the right anterior septum, no other lesions within the nose were noted  Neck supple full active range of motion  No lesions of the oropharynx, no blood noted Seprately Identifiable Procedures:  PROCEDURE NOTE: Preoperative diagnosis: left epistaxis Postoperative diagnosis: same  Procedure: control of nasal hemorrhage anterior, simple (CPT 95621) Indication: Epistaxis EBL: 0 mL Provider: Lorane Rocker PA-C   Procedure: The patient was identified and properly positioned and verbal consent obtained. Topical lidocaine  and Afrin were applied to the nasal cavity on the left and allowed to work for 10 minutes. The area over the  left anterior septum which was the suspected source of bleeding was focally cauterized using silver nitrate cautery with help of nasal speculum and headlight. There was no significant bleeding afterwards.  Patient tolerated the procedure well   Impression & Plans:  Jackquline Ireland is a 72 y.o. female with the following   Epistaxis-  This is a 73 year old female seen in our office for evaluation of left-sided epistaxis.  I did remove the [employer], she had some ongoing bleeding from the likely area of initial bleed.  This was along the left anterior nasal septum.  After bleeding was controlled with Afrin and pressure I did proceed with chemical cautery.  This did resolve the bleed, she was watched in  the office for several minutes after this with no rebleeding.  I advised her to use Vaseline on the naris, nasal saline sprays, humidification, and avoid any nasal trauma.  She will resume her Plavix  this week.  I advised her to follow-up immediately if she has any uncontrolled bleeding.  As far as her fever and chills, she has no infectious signs or symptoms on exam today, she did have thorough workup in the ER I encouraged her to continue outpatient follow-up for this.  She verbalized understanding and agreement to today's plan had no further questions or concerns.  She may follow-up on a PRN basis.   - f/u as needed   Thank you for allowing me the opportunity to care for your patient. Please do not hesitate to contact me should you have any other questions.  Sincerely, Belma Boxer PA-C [facility] Phone: [phone] Fax: [phone]  12/01/2023, 11:51 AM

## 2023-12-09 ENCOUNTER — Ambulatory Visit (HOSPITAL_COMMUNITY): Admission: RE | Admit: 2023-12-09 | Source: Ambulatory Visit

## 2023-12-09 ENCOUNTER — Encounter (HOSPITAL_COMMUNITY)

## 2023-12-09 ENCOUNTER — Ambulatory Visit (HOSPITAL_COMMUNITY)

## 2023-12-13 ENCOUNTER — Encounter: Payer: Self-pay | Admitting: Cardiovascular Disease

## 2024-01-20 ENCOUNTER — Encounter (INDEPENDENT_AMBULATORY_CARE_PROVIDER_SITE_OTHER): Payer: Self-pay | Admitting: Physician Assistant

## 2024-01-20 ENCOUNTER — Ambulatory Visit (INDEPENDENT_AMBULATORY_CARE_PROVIDER_SITE_OTHER): Admitting: Physician Assistant

## 2024-01-20 VITALS — BP 122/62 | HR 88

## 2024-01-20 DIAGNOSIS — R040 Epistaxis: Secondary | ICD-10-CM | POA: Diagnosis not present

## 2024-01-20 NOTE — Progress Notes (Unsigned)
 Dear Dr. Hazeline Lister, Here is my assessment for our mutual patient, Cassandra Santana. Thank you for allowing me the opportunity to care for your patient. Please do not hesitate to contact me should you have any other questions. Sincerely, Belma Boxer PA-C  Otolaryngology Clinic Note Referring provider: Dr. Hazeline Lister HPI:  Cassandra Santana is a 73 y.o. female kindly referred by Dr. Hazeline Lister   The patient is a 13 YOF seen today for follow up evaluation for epistaxis. She was last seen in the clinic on 12/01/2023 for epistaxis. Below is a recap of that encounter.    The patient is a 73 year old female presenting today for hospital follow-up status post epistaxis.  The patient notes that on 11/27/2023 she had epistaxis out of the left nose.  She notes this did not stop with pressure, she saw evaluation in the ER for this.  She does note that she has had a previous history of the same and has been having intermittent nosebleeds for several months.  She notes mostly its on the left, sometimes on the right.  In the ER balloon packing was placed and she was discharged home on antibiotics and outpatient follow-up.  She denies any bleeding since that time.  She also notes that on the day of evaluation she had been having fever and chills.  She notes that since she was seen in the hospital she continued to have fever and chills and followed up in the emergency room yesterday with essentially normal workup.  She denies any preceding trauma to her nose, she does not put anything in her nose.  She is not using nasal sprays.  She notes she is on aspirin  and Plavix  and has been holding it since the most recent nosebleed.  She is a current smoker.   Update 6/202/25  Since her last office visit    No more bleeding, no more problems no pain    Independent Review of Additional Tests or Records:  ***   PMH/Meds/All/SocHx/FamHx/ROS:   Past Medical History:  Diagnosis Date   Allergies    Anemia    Arthritis    CAD  (coronary artery disease)    Colitis    Colon polyp    High cholesterol    HTN (hypertension)    Hyperlipidemia    Hypertension    Internal hemorrhoids    NSTEMI (non-ST elevated myocardial infarction) (HCC) 04/2018   PAD (peripheral artery disease) (HCC)    Rectal polyp    hyperplastic     Past Surgical History:  Procedure Laterality Date   ABDOMINAL AORTOGRAM W/LOWER EXTREMITY Bilateral 08/28/2018   Procedure: ABDOMINAL AORTOGRAM W/LOWER EXTREMITY;  Surgeon: Avanell Leigh, MD;  Location: [facility] INVASIVE CV LAB;  Service: Cardiovascular;  Laterality: Bilateral;   ABDOMINAL HYSTERECTOMY  1993   CORONARY STENT INTERVENTION N/A 04/06/2018   Procedure: CORONARY STENT INTERVENTION;  Surgeon: Arnoldo Lapping, MD;  Location: [facility] INVASIVE CV LAB;  Service: Cardiovascular;  Laterality: N/A;   LEFT HEART CATH AND CORONARY ANGIOGRAPHY N/A 04/06/2018   Procedure: LEFT HEART CATH AND CORONARY ANGIOGRAPHY;  Surgeon: Arnoldo Lapping, MD;  Location: [facility] INVASIVE CV LAB;  Service: Cardiovascular;  Laterality: N/A;   PERIPHERAL VASCULAR ATHERECTOMY Left 08/28/2018   Procedure: PERIPHERAL VASCULAR ATHERECTOMY;  Surgeon: Avanell Leigh, MD;  Location: [facility] INVASIVE CV LAB;  Service: Cardiovascular;  Laterality: Left;  SFA   ROTATOR CUFF REPAIR Right     Family History  Problem Relation Age of Onset   Kidney failure Mother  Diabetes Mother    Heart disease Mother    Heart attack Father    Heart disease Father    Diabetes Sister    Heart disease Sister    Pancreatic cancer Maternal Grandmother    Colon cancer Neg Hx    Colon polyps Neg Hx    Esophageal cancer Neg Hx    Rectal cancer Neg Hx    Stomach cancer Neg Hx      Social Connections: Not on file      Current Outpatient Medications:    amitriptyline (ELAVIL) 50 MG tablet, Take 50 mg by mouth at bedtime., Disp: , Rfl:    amLODipine  (NORVASC ) 5 MG tablet, Take 1 tablet (5 mg total) by mouth daily., Disp: 90 tablet, Rfl: 0   aspirin  EC 81  MG EC tablet, Take 1 tablet (81 mg total) by mouth daily. (Patient not taking: Reported on 12/01/2023), Disp: , Rfl:    atorvastatin  (LIPITOR ) 80 MG tablet, Take 1 tablet (80 mg total) by mouth daily at 6 PM., Disp: 90 tablet, Rfl: 3   clopidogrel  (PLAVIX ) 75 MG tablet, Take 1 tablet (75 mg total) by mouth daily. (Patient not taking: Reported on 12/01/2023), Disp: 90 tablet, Rfl: 3   clotrimazole (LOTRIMIN) 1 % cream, , Disp: , Rfl:    ferrous sulfate  325 (65 FE) MG EC tablet, Take 1 tablet (325 mg total) by mouth 2 (two) times daily., Disp: 60 tablet, Rfl: 3   gabapentin (NEURONTIN) 100 MG capsule, Take 100 mg by mouth 2 (two) times daily., Disp: , Rfl:    gabapentin (NEURONTIN) 300 MG capsule, Take 300 mg by mouth in the morning and at bedtime., Disp: , Rfl:    lisinopril  (ZESTRIL ) 5 MG tablet, Take 1 tablet (5 mg total) by mouth daily., Disp: 90 tablet, Rfl: 3   Melatonin 3 MG CAPS, Take 9 mg by mouth at bedtime., Disp: , Rfl:    nicotine  (NICODERM CQ ) 14 mg/24hr patch, Place 1 patch (14 mg total) onto the skin daily. Use 1 patch daily for weeks 7-8, Disp: 14 patch, Rfl: 0   nicotine  (NICODERM CQ ) 21 mg/24hr patch, Place 1 patch (21 mg total) onto the skin daily. Use 1 patch daily for weeks 1-6., Disp: 7 patch, Rfl: 6   nicotine  (NICODERM CQ ) 7 mg/24hr patch, Place 1 patch (7 mg total) onto the skin daily. Use 1 patch daily for weeks 9-10, Disp: 14 patch, Rfl: 0   nitroGLYCERIN  (NITROSTAT ) 0.4 MG SL tablet, Place 1 tablet (0.4 mg total) under the tongue every 5 (five) minutes x 3 doses as needed for chest pain., Disp: 25 tablet, Rfl: 6   ondansetron  (ZOFRAN -ODT) 4 MG disintegrating tablet, Take 1 tablet (4 mg total) by mouth every 8 (eight) hours as needed for nausea., Disp: 10 tablet, Rfl: 0   oxyCODONE  (ROXICODONE ) 5 MG immediate release tablet, Take 1 tablet (5 mg total) by mouth every 4 (four) hours as needed for up to 5 doses for severe pain (pain score 7-10)., Disp: 5 tablet, Rfl: 0   pregabalin  (LYRICA) 50 MG capsule, Take 50 mg by mouth every morning., Disp: , Rfl:    traZODone  (DESYREL ) 100 MG tablet, Take 200 mg by mouth at bedtime as needed., Disp: , Rfl:   Current Facility-Administered Medications:    0.9 %  sodium chloride  infusion, 500 mL, Intravenous, Once, Tobin Forts, MD   Physical Exam:   There were no vitals taken for this visit.  Pertinent Findings  CN  II-XII intact ***Bilateral EAC clear and TM intact with well pneumatized middle ear spaces Weber 512: equal Rinne 512: AC > BC b/l  Anterior rhinoscopy: Septum ***; bilateral inferior turbinates with *** No lesions of oral cavity/oropharynx; dentition *** No obviously palpable neck masses/lymphadenopathy/thyromegaly No respiratory distress or stridor  Seprately Identifiable Procedures:  None***  Impression & Plans:  Cassandra Santana is a 73 y.o. female with the following   ***   - f/u ***   Thank you for allowing me the opportunity to care for your patient. Please do not hesitate to contact me should you have any other questions.  Sincerely, Belma Boxer PA-C [facility] Phone: [phone] Fax: [phone]  01/20/2024, 1:36 PM

## 2024-02-01 ENCOUNTER — Telehealth: Payer: Self-pay | Admitting: Cardiovascular Disease

## 2024-02-01 MED ORDER — LISINOPRIL 5 MG PO TABS
5.0000 mg | ORAL_TABLET | Freq: Every day | ORAL | 2 refills | Status: AC
Start: 2024-02-01 — End: ?

## 2024-02-01 MED ORDER — AMLODIPINE BESYLATE 5 MG PO TABS: 5.0000 mg | ORAL_TABLET | Freq: Every day | ORAL | 2 refills | Status: AC

## 2024-02-01 NOTE — Telephone Encounter (Signed)
 Pt's medications were sent to pt's pharmacy as requested. Confirmation received.

## 2024-02-01 NOTE — Telephone Encounter (Signed)
 *  STAT* If patient is at the pharmacy, call can be transferred to refill team.   1. Which medications need to be refilled? (please list name of each medication and dose if known)   amLODipine  (NORVASC ) 5 MG tablet   lisinopril  (ZESTRIL ) 5 MG tablet   2. Would you like to learn more about the convenience, safety, & potential cost savings by using the [facility]? No      3. Are you open to using the [facility] (Type [facility]. ). No    4. Which pharmacy/location (including street and city if local pharmacy) is medication to be sent to? [id number] - [place] ([place]), [place] - [address]     5. Do they need a 30 day or 90 day supply? 90 days

## 2024-02-08 ENCOUNTER — Ambulatory Visit (HOSPITAL_COMMUNITY)
Admission: RE | Admit: 2024-02-08 | Discharge: 2024-02-08 | Disposition: A | Discharge status: Home / Self Care | Source: Ambulatory Visit | Attending: Cardiovascular Disease | Admitting: Cardiovascular Disease

## 2024-02-08 ENCOUNTER — Ambulatory Visit: Payer: Self-pay | Admitting: Cardiovascular Disease

## 2024-02-08 DIAGNOSIS — I739 Peripheral vascular disease, unspecified: Secondary | ICD-10-CM | POA: Diagnosis not present

## 2024-02-09 ENCOUNTER — Other Ambulatory Visit: Payer: Self-pay | Admitting: *Deleted

## 2024-02-09 DIAGNOSIS — I739 Peripheral vascular disease, unspecified: Secondary | ICD-10-CM

## 2024-02-10 LAB — VAS US ABI WITH/WO TBI
Left ABI: 0.88
Right ABI: 0.69

## 2024-02-14 ENCOUNTER — Other Ambulatory Visit: Payer: Self-pay | Admitting: Family Medicine

## 2024-02-14 DIAGNOSIS — Z1231 Encounter for screening mammogram for malignant neoplasm of breast: Secondary | ICD-10-CM

## 2024-03-13 ENCOUNTER — Telehealth: Payer: Self-pay | Admitting: Cardiovascular Disease

## 2024-03-13 NOTE — Telephone Encounter (Signed)
*  STAT* If patient is at the pharmacy, call can be transferred to refill team.   1. Which medications need to be refilled? (please list name of each medication and dose if known)   atorvastatin  (LIPITOR ) 80 MG tablet     4. Which pharmacy/location (including street and city if local pharmacy) is medication to be sent to? [id number] - [place] ([place]), [place] - [address]     5. Do they need a 30 day or 90 day supply? 90

## 2024-03-14 MED ORDER — ATORVASTATIN CALCIUM 80 MG PO TABS: 80.0000 mg | ORAL_TABLET | Freq: Every day | ORAL | 3 refills | Status: AC

## 2024-03-14 NOTE — Telephone Encounter (Signed)
 Rx sent to pharmacy

## 2024-03-16 ENCOUNTER — Ambulatory Visit
Admission: RE | Admit: 2024-03-16 | Discharge: 2024-03-16 | Disposition: A | Discharge status: Home / Self Care | Source: Ambulatory Visit | Attending: Family Medicine | Admitting: Family Medicine

## 2024-03-16 DIAGNOSIS — Z1231 Encounter for screening mammogram for malignant neoplasm of breast: Secondary | ICD-10-CM | POA: Diagnosis not present
